# Patient Record
Sex: Female | Born: 1950 | Race: Black or African American | Hispanic: No | Smoking: Never smoker
Health system: Southern US, Community
[De-identification: ages and names within clinical notes are randomized; demographics above are authoritative.]

## PROBLEM LIST (undated history)

## (undated) DIAGNOSIS — E119 Type 2 diabetes mellitus without complications: Secondary | ICD-10-CM

## (undated) DIAGNOSIS — E785 Hyperlipidemia, unspecified: Secondary | ICD-10-CM

## (undated) DIAGNOSIS — M109 Gout, unspecified: Secondary | ICD-10-CM

## (undated) DIAGNOSIS — I10 Essential (primary) hypertension: Secondary | ICD-10-CM

## (undated) DIAGNOSIS — F329 Major depressive disorder, single episode, unspecified: Secondary | ICD-10-CM

## (undated) DIAGNOSIS — F32A Depression, unspecified: Secondary | ICD-10-CM

## (undated) DIAGNOSIS — R5383 Other fatigue: Secondary | ICD-10-CM

## (undated) HISTORY — PX: KNEE SURGERY: SHX244

## (undated) HISTORY — DX: Gout, unspecified: M10.9

## (undated) HISTORY — DX: Other fatigue: R53.83

## (undated) HISTORY — PX: CHOLECYSTECTOMY: SHX55

## (undated) HISTORY — PX: ABDOMINAL HYSTERECTOMY: SHX81

## (undated) HISTORY — PX: TOTAL ABDOMINAL HYSTERECTOMY W/ BILATERAL SALPINGOOPHORECTOMY: SHX83

## (undated) HISTORY — PX: CARPAL TUNNEL RELEASE: SHX101

## (undated) HISTORY — DX: Hyperlipidemia, unspecified: E78.5

## (undated) HISTORY — PX: ROTATOR CUFF REPAIR: SHX139

## (undated) HISTORY — DX: Type 2 diabetes mellitus without complications: E11.9

## (undated) HISTORY — DX: Major depressive disorder, single episode, unspecified: F32.9

## (undated) HISTORY — PX: OTHER SURGICAL HISTORY: SHX169

## (undated) HISTORY — DX: Depression, unspecified: F32.A

## (undated) HISTORY — DX: Essential (primary) hypertension: I10

---

## 2004-01-23 ENCOUNTER — Other Ambulatory Visit: Payer: Self-pay

## 2004-01-23 ENCOUNTER — Observation Stay: Payer: Self-pay | Admitting: Internal Medicine

## 2006-06-15 ENCOUNTER — Emergency Department: Payer: Self-pay | Admitting: Internal Medicine

## 2006-12-26 ENCOUNTER — Ambulatory Visit: Payer: Self-pay

## 2009-03-18 HISTORY — PX: APPENDECTOMY: SHX54

## 2010-04-17 ENCOUNTER — Emergency Department: Payer: Self-pay | Admitting: Emergency Medicine

## 2010-08-03 ENCOUNTER — Ambulatory Visit: Payer: Self-pay

## 2010-08-24 ENCOUNTER — Ambulatory Visit: Payer: Self-pay | Admitting: General Surgery

## 2010-08-28 LAB — PATHOLOGY REPORT

## 2010-11-16 ENCOUNTER — Emergency Department: Payer: Self-pay | Admitting: Internal Medicine

## 2011-11-25 ENCOUNTER — Emergency Department: Payer: Self-pay | Admitting: Emergency Medicine

## 2011-11-25 LAB — COMPREHENSIVE METABOLIC PANEL
Albumin: 3.8 g/dL (ref 3.4–5.0)
Alkaline Phosphatase: 80 U/L (ref 50–136)
Anion Gap: 7 (ref 7–16)
Bilirubin,Total: 0.3 mg/dL (ref 0.2–1.0)
Calcium, Total: 9.4 mg/dL (ref 8.5–10.1)
Chloride: 107 mmol/L (ref 98–107)
Co2: 28 mmol/L (ref 21–32)
EGFR (African American): >60
SGOT(AST): 25 U/L (ref 15–37)
SGPT (ALT): 17 U/L (ref 12–78)
Total Protein: 8 g/dL (ref 6.4–8.2)

## 2011-11-25 LAB — CBC
HGB: 12.4 g/dL (ref 12.0–16.0)
MCH: 26.3 pg (ref 26.0–34.0)
MCHC: 32.1 g/dL (ref 32.0–36.0)
Platelet: 259 10*3/uL (ref 150–440)
RBC: 4.71 10*6/uL (ref 3.80–5.20)
RDW: 14.2 % (ref 11.5–14.5)

## 2011-11-25 LAB — TROPONIN I: Troponin-I: <0.02 ng/mL

## 2011-11-25 LAB — CK TOTAL AND CKMB (NOT AT ARMC)
CK, Total: 145 U/L (ref 21–215)
CK-MB: 1.2 ng/mL (ref 0.5–3.6)

## 2013-09-16 ENCOUNTER — Ambulatory Visit: Payer: Self-pay | Admitting: Family Medicine

## 2013-11-05 ENCOUNTER — Ambulatory Visit: Payer: Self-pay | Admitting: Podiatry

## 2013-11-15 ENCOUNTER — Emergency Department: Payer: Self-pay | Admitting: Emergency Medicine

## 2013-11-15 LAB — COMPREHENSIVE METABOLIC PANEL
ALK PHOS: 75 U/L
ANION GAP: 8 (ref 7–16)
Albumin: 3.8 g/dL (ref 3.4–5.0)
BILIRUBIN TOTAL: 0.3 mg/dL (ref 0.2–1.0)
BUN: 12 mg/dL (ref 7–18)
CALCIUM: 8.7 mg/dL (ref 8.5–10.1)
Chloride: 106 mmol/L (ref 98–107)
Co2: 26 mmol/L (ref 21–32)
Creatinine: 0.9 mg/dL (ref 0.60–1.30)
Glucose: 95 mg/dL (ref 65–99)
Osmolality: 279 (ref 275–301)
Potassium: 3.3 mmol/L — ABNORMAL LOW (ref 3.5–5.1)
SGOT(AST): 17 U/L (ref 15–37)
SGPT (ALT): 20 U/L
Sodium: 140 mmol/L (ref 136–145)
Total Protein: 7.9 g/dL (ref 6.4–8.2)

## 2013-11-15 LAB — CBC WITH DIFFERENTIAL/PLATELET
BASOS ABS: 0.1 10*3/uL (ref 0.0–0.1)
Basophil %: 0.5 %
EOS PCT: 4 %
Eosinophil #: 0.4 10*3/uL (ref 0.0–0.7)
HCT: 40.4 % (ref 35.0–47.0)
HGB: 12.5 g/dL (ref 12.0–16.0)
Lymphocyte #: 3.3 10*3/uL (ref 1.0–3.6)
Lymphocyte %: 33.9 %
MCH: 25.8 pg — AB (ref 26.0–34.0)
MCHC: 30.9 g/dL — ABNORMAL LOW (ref 32.0–36.0)
MCV: 84 fL (ref 80–100)
Monocyte #: 0.8 x10 3/mm (ref 0.2–0.9)
Monocyte %: 8.5 %
NEUTROS ABS: 5.2 10*3/uL (ref 1.4–6.5)
Neutrophil %: 53.1 %
Platelet: 259 10*3/uL (ref 150–440)
RBC: 4.84 10*6/uL (ref 3.80–5.20)
RDW: 14.2 % (ref 11.5–14.5)
WBC: 9.9 10*3/uL (ref 3.6–11.0)

## 2013-11-15 LAB — URINALYSIS, COMPLETE
BLOOD: NEGATIVE
Bacteria: NONE SEEN
Bilirubin,UR: NEGATIVE
Glucose,UR: NEGATIVE mg/dL (ref 0–75)
KETONE: NEGATIVE
Leukocyte Esterase: NEGATIVE
Nitrite: NEGATIVE
PROTEIN: NEGATIVE
Ph: 6 (ref 4.5–8.0)
RBC,UR: <1 /HPF (ref 0–5)
Specific Gravity: 1.014 (ref 1.003–1.030)
Squamous Epithelial: NONE SEEN

## 2013-11-15 LAB — TROPONIN I

## 2013-11-15 LAB — LIPASE, BLOOD: LIPASE: 149 U/L (ref 73–393)

## 2014-02-25 ENCOUNTER — Encounter: Payer: Self-pay | Admitting: Podiatry

## 2014-02-26 NOTE — Progress Notes (Signed)
Subjective:     Patient ID: Ashley SpiresLinda Harris Molesky, female   DOB: 1950-04-07, 63 y.o.   MRN: [id number]  HPI   Review of Systems     Objective:   Physical Exam     Assessment:       Plan:          This encounter was created in error - please disregard.

## 2014-08-23 ENCOUNTER — Other Ambulatory Visit: Payer: Self-pay | Admitting: Family Medicine

## 2014-08-31 ENCOUNTER — Telehealth: Payer: Self-pay | Admitting: Unknown Physician Specialty

## 2014-08-31 NOTE — Telephone Encounter (Signed)
Pt came in said she needed a refill on blood pressure medicine but did not know the name of it. She says if [facility] could call [facility] on [address] she'd appreciate it. If there is an issue she would like a call back at the number provided.

## 2014-09-06 ENCOUNTER — Telehealth: Payer: Self-pay | Admitting: Family Medicine

## 2014-09-06 MED ORDER — AMLODIPINE BESYLATE 5 MG PO TABS: 5.0000 mg | ORAL_TABLET | Freq: Every day | ORAL | Status: DC

## 2014-09-06 NOTE — Telephone Encounter (Signed)
I think this is your patient Elnita Maxwell.

## 2014-09-06 NOTE — Telephone Encounter (Signed)
Patient needs a refill on Amlodipine 5mg   QD. She was last seen in November 2015 by CW, has not been seen since, also does not have an appt scheduled.

## 2014-09-06 NOTE — Telephone Encounter (Signed)
Sent in a refill but needs seen for further refills

## 2014-09-06 NOTE — Telephone Encounter (Signed)
[facility] in [place] called stated pt needs a refill on:  Amlodopine   Thanks.

## 2014-09-12 ENCOUNTER — Telehealth: Payer: Self-pay

## 2014-09-12 NOTE — Telephone Encounter (Signed)
Called  and left patient a voicemail to return my call

## 2014-09-12 NOTE — Telephone Encounter (Signed)
Patient called stating she needs her blood pressure/fluid pill refilled. I am going to call the patient ad find out exactly what medication she is talking about.

## 2014-09-13 NOTE — Telephone Encounter (Signed)
Tried to call patient and find out the name of the medication she is needing refilled. There was no answer so I left her a voicemail to return my call.

## 2014-09-15 DIAGNOSIS — G4762 Sleep related leg cramps: Secondary | ICD-10-CM

## 2014-09-15 DIAGNOSIS — E119 Type 2 diabetes mellitus without complications: Secondary | ICD-10-CM

## 2014-09-15 DIAGNOSIS — F32A Depression, unspecified: Secondary | ICD-10-CM

## 2014-09-15 DIAGNOSIS — R7301 Impaired fasting glucose: Secondary | ICD-10-CM | POA: Insufficient documentation

## 2014-09-15 DIAGNOSIS — E785 Hyperlipidemia, unspecified: Secondary | ICD-10-CM

## 2014-09-15 DIAGNOSIS — E669 Obesity, unspecified: Secondary | ICD-10-CM | POA: Insufficient documentation

## 2014-09-15 DIAGNOSIS — I10 Essential (primary) hypertension: Secondary | ICD-10-CM

## 2014-09-15 DIAGNOSIS — R5383 Other fatigue: Secondary | ICD-10-CM

## 2014-09-15 DIAGNOSIS — F329 Major depressive disorder, single episode, unspecified: Secondary | ICD-10-CM

## 2014-09-16 ENCOUNTER — Ambulatory Visit: Payer: Self-pay | Admitting: Unknown Physician Specialty

## 2014-10-03 ENCOUNTER — Ambulatory Visit (INDEPENDENT_AMBULATORY_CARE_PROVIDER_SITE_OTHER): Payer: BLUE CROSS/BLUE SHIELD | Admitting: Unknown Physician Specialty

## 2014-10-03 ENCOUNTER — Encounter: Payer: Self-pay | Admitting: Unknown Physician Specialty

## 2014-10-03 VITALS — BP 145/84 | HR 86 | Temp 98.5°F | Ht 63.5 in | Wt 222.4 lb

## 2014-10-03 DIAGNOSIS — R7301 Impaired fasting glucose: Secondary | ICD-10-CM | POA: Diagnosis not present

## 2014-10-03 DIAGNOSIS — I10 Essential (primary) hypertension: Secondary | ICD-10-CM

## 2014-10-03 DIAGNOSIS — E669 Obesity, unspecified: Secondary | ICD-10-CM | POA: Diagnosis not present

## 2014-10-03 DIAGNOSIS — R300 Dysuria: Secondary | ICD-10-CM | POA: Diagnosis not present

## 2014-10-03 DIAGNOSIS — E785 Hyperlipidemia, unspecified: Secondary | ICD-10-CM

## 2014-10-03 MED ORDER — HYDROCHLOROTHIAZIDE 25 MG PO TABS: 25.0000 mg | ORAL_TABLET | Freq: Every day | ORAL | Status: DC

## 2014-10-03 MED ORDER — AMLODIPINE BESYLATE 5 MG PO TABS: 5.0000 mg | ORAL_TABLET | Freq: Every day | ORAL | Status: DC

## 2014-10-03 MED ORDER — CIPROFLOXACIN HCL 250 MG PO TABS: 250.0000 mg | ORAL_TABLET | Freq: Two times a day (BID) | ORAL | Status: DC

## 2014-10-03 MED ORDER — LISINOPRIL 40 MG PO TABS: 40.0000 mg | ORAL_TABLET | Freq: Every day | ORAL | Status: DC

## 2014-10-03 NOTE — Progress Notes (Signed)
BP 145/84 mmHg  Pulse 86  Temp(Src) 98.5 F (36.9 C)  Ht 5' 3.5" (1.613 m)  Wt 222 lb 6.4 oz (100.88 kg)  BMI 38.77 kg/m2  SpO2 96%  LMP  (LMP Unknown)   Subjective:    Patient ID: Ashley Mcknight, female    DOB: 08-20-1950, 64 y.o.   MRN: [id number]  HPI: Ashley Mcknight is a 64 y.o. female  Chief Complaint  Patient presents with  . Medication Refill    pt states she needs bp med refilled   Hypertension This is a chronic problem. Condition status: A little high today but just came back from the gym. Pertinent negatives include no anxiety, blurred vision, chest pain, headaches, malaise/fatigue, neck pain, orthopnea, palpitations, peripheral edema, PND, shortness of breath or sweats. There are no compliance problems.     Relevant past medical, surgical, family and social history reviewed and updated as indicated. Interim medical history since our last visit reviewed. Allergies and medications reviewed and updated.  Review of Systems  Constitutional: Negative for malaise/fatigue.  Eyes: Negative for blurred vision.  Respiratory: Negative for shortness of breath.   Cardiovascular: Negative for chest pain, palpitations, orthopnea and PND.  Genitourinary:       Having trouble "holding my urine"  Musculoskeletal: Negative for neck pain.  Neurological: Negative for headaches.    Per HPI unless specifically indicated above     Objective:    BP 145/84 mmHg  Pulse 86  Temp(Src) 98.5 F (36.9 C)  Ht 5' 3.5" (1.613 m)  Wt 222 lb 6.4 oz (100.88 kg)  BMI 38.77 kg/m2  SpO2 96%  LMP  (LMP Unknown)  Wt Readings from Last 3 Encounters:  10/03/14 222 lb 6.4 oz (100.88 kg)  02/04/14 220 lb (99.791 kg)    Physical Exam  Constitutional: She is oriented to person, place, and time. She appears well-developed and well-nourished. No distress.  HENT:  Head: Normocephalic and atraumatic.  Eyes: Conjunctivae and lids are normal. Right eye exhibits no discharge. Left eye  exhibits no discharge. No scleral icterus.  Cardiovascular: Normal rate, regular rhythm, normal heart sounds and intact distal pulses.   Pulmonary/Chest: Effort normal. No respiratory distress.  Abdominal: Normal appearance and bowel sounds are normal. She exhibits no distension. There is no splenomegaly or hepatomegaly. There is no tenderness.  Musculoskeletal: Normal range of motion.  Neurological: She is alert and oriented to person, place, and time.  Skin: Skin is intact. No rash noted. No pallor.  Psychiatric: She has a normal mood and affect. Her behavior is normal. Judgment and thought content normal.        Assessment & Plan:   Problem List Items Addressed This Visit      Cardiovascular and Mediastinum   Hypertension - Primary    Not quite to goal today.  Will recheck in about 3 months      Relevant Medications   amLODipine (NORVASC) 5 MG tablet   hydrochlorothiazide (HYDRODIURIL) 25 MG tablet   lisinopril (PRINIVIL,ZESTRIL) 40 MG tablet   Other Relevant Orders   Comprehensive metabolic panel   Microalbumin, Urine Waived   Uric acid     Endocrine   Impaired fasting glucose    Hgb A1C is 6.0 today.  Discussed results        Other   Hyperlipidemia   Relevant Medications   amLODipine (NORVASC) 5 MG tablet   hydrochlorothiazide (HYDRODIURIL) 25 MG tablet   lisinopril (PRINIVIL,ZESTRIL) 40 MG tablet   Other Relevant  Orders   Lipid Panel Piccolo, Waived   Obesity   Relevant Orders   Bayer DCA Hb A1c Waived    Other Visit Diagnoses    Dysuria        Urine positive.  Will rx Cipro to see if it improves symptoms    Relevant Medications    ciprofloxacin (CIPRO) 250 MG tablet    Other Relevant Orders    UA/M w/rflx Culture, Routine        Follow up plan: Return in about 3 months (around 01/03/2015) for BP.

## 2014-10-03 NOTE — Patient Instructions (Signed)
DASH Eating Plan °DASH stands for "Dietary Approaches to Stop Hypertension." The DASH eating plan is a healthy eating plan that has been shown to reduce high blood pressure (hypertension). Additional health benefits may include reducing the risk of type 2 diabetes mellitus, heart disease, and stroke. The DASH eating plan may also help with weight loss. °WHAT DO I NEED TO KNOW ABOUT THE DASH EATING PLAN? °For the DASH eating plan, you will follow these general guidelines: °· Choose foods with a percent daily value for sodium of less than 5% (as listed on the food label). °· Use salt-free seasonings or herbs instead of table salt or sea salt. °· Check with your health care provider or pharmacist before using salt substitutes. °· Eat lower-sodium products, often labeled as "lower sodium" or "no salt added." °· Eat fresh foods. °· Eat more vegetables, fruits, and low-fat dairy products. °· Choose whole grains. Look for the word "whole" as the first word in the ingredient list. °· Choose fish and skinless chicken or turkey more often than red meat. Limit fish, poultry, and meat to 6 oz (170 g) each day. °· Limit sweets, desserts, sugars, and sugary drinks. °· Choose heart-healthy fats. °· Limit cheese to 1 oz (28 g) per day. °· Eat more home-cooked food and less restaurant, buffet, and fast food. °· Limit fried foods. °· Cook foods using methods other than frying. °· Limit canned vegetables. If you do use them, rinse them well to decrease the sodium. °· When eating at a restaurant, ask that your food be prepared with less salt, or no salt if possible. °WHAT FOODS CAN I EAT? °Seek help from a dietitian for individual calorie needs. °Grains °Whole grain or whole wheat bread. Brown rice. Whole grain or whole wheat pasta. Quinoa, bulgur, and whole grain cereals. Low-sodium cereals. Corn or whole wheat flour tortillas. Whole grain cornbread. Whole grain crackers. Low-sodium crackers. °Vegetables °Fresh or frozen vegetables  (raw, steamed, roasted, or grilled). Low-sodium or reduced-sodium tomato and vegetable juices. Low-sodium or reduced-sodium tomato sauce and paste. Low-sodium or reduced-sodium canned vegetables.  °Fruits °All fresh, canned (in natural juice), or frozen fruits. °Meat and Other Protein Products °Ground beef (85% or leaner), grass-fed beef, or beef trimmed of fat. Skinless chicken or turkey. Ground chicken or turkey. Pork trimmed of fat. All fish and seafood. Eggs. Dried beans, peas, or lentils. Unsalted nuts and seeds. Unsalted canned beans. °Dairy °Low-fat dairy products, such as skim or 1% milk, 2% or reduced-fat cheeses, low-fat ricotta or cottage cheese, or plain low-fat yogurt. Low-sodium or reduced-sodium cheeses. °Fats and Oils °Tub margarines without trans fats. Light or reduced-fat mayonnaise and salad dressings (reduced sodium). Avocado. Safflower, olive, or canola oils. Natural peanut or almond butter. °Other °Unsalted popcorn and pretzels. °The items listed above may not be a complete list of recommended foods or beverages. Contact your dietitian for more options. °WHAT FOODS ARE NOT RECOMMENDED? °Grains °White bread. White pasta. White rice. Refined cornbread. Bagels and croissants. Crackers that contain trans fat. °Vegetables °Creamed or fried vegetables. Vegetables in a cheese sauce. Regular canned vegetables. Regular canned tomato sauce and paste. Regular tomato and vegetable juices. °Fruits °Dried fruits. Canned fruit in light or heavy syrup. Fruit juice. °Meat and Other Protein Products °Fatty cuts of meat. Ribs, chicken wings, bacon, sausage, bologna, salami, chitterlings, fatback, hot dogs, bratwurst, and packaged luncheon meats. Salted nuts and seeds. Canned beans with salt. °Dairy °Whole or 2% milk, cream, half-and-half, and cream cheese. Whole-fat or sweetened yogurt. Full-fat   cheeses or blue cheese. Nondairy creamers and whipped toppings. Processed cheese, cheese spreads, or cheese  curds. °Condiments °Onion and garlic salt, seasoned salt, table salt, and sea salt. Canned and packaged gravies. Worcestershire sauce. Tartar sauce. Barbecue sauce. Teriyaki sauce. Soy sauce, including reduced sodium. Steak sauce. Fish sauce. Oyster sauce. Cocktail sauce. Horseradish. Ketchup and mustard. Meat flavorings and tenderizers. Bouillon cubes. Hot sauce. Tabasco sauce. Marinades. Taco seasonings. Relishes. °Fats and Oils °Butter, stick margarine, lard, shortening, ghee, and bacon fat. Coconut, palm kernel, or palm oils. Regular salad dressings. °Other °Pickles and olives. Salted popcorn and pretzels. °The items listed above may not be a complete list of foods and beverages to avoid. Contact your dietitian for more information. °WHERE CAN I FIND MORE INFORMATION? °National Heart, Lung, and [employer] Institute: www.nhlbi.nih.gov/health/health-topics/topics/dash/ °Document Released: 02/21/2011 Document Revised: 07/19/2013 Document Reviewed: 01/06/2013 °ExitCare® Patient Information ©2015 [employer]. This information is not intended to replace advice given to you by your health care provider. Make sure you discuss any questions you have with your health care provider. ° °

## 2014-10-03 NOTE — Assessment & Plan Note (Signed)
Not quite to goal today.  Will recheck in about 3 months

## 2014-10-03 NOTE — Assessment & Plan Note (Signed)
Hgb A1C is 6.0 today.  Discussed results

## 2014-10-04 LAB — COMPREHENSIVE METABOLIC PANEL
A/G RATIO: 1.3 (ref 1.1–2.5)
ALBUMIN: 3.9 g/dL (ref 3.6–4.8)
ALK PHOS: 69 IU/L (ref 39–117)
ALT: 11 IU/L (ref 0–32)
AST: 14 IU/L (ref 0–40)
BUN/Creatinine Ratio: 16 (ref 11–26)
BUN: 14 mg/dL (ref 8–27)
CALCIUM: 9 mg/dL (ref 8.7–10.3)
CO2: 21 mmol/L (ref 18–29)
CREATININE: 0.87 mg/dL (ref 0.57–1.00)
Chloride: 101 mmol/L (ref 97–108)
GFR calc non Af Amer: 72 mL/min/{1.73_m2} (ref >59)
GFR, EST AFRICAN AMERICAN: 83 mL/min/{1.73_m2} (ref >59)
GLUCOSE: 84 mg/dL (ref 65–99)
Globulin, Total: 3 g/dL (ref 1.5–4.5)
POTASSIUM: 4.3 mmol/L (ref 3.5–5.2)
SODIUM: 140 mmol/L (ref 134–144)
Total Protein: 6.9 g/dL (ref 6.0–8.5)

## 2014-10-04 LAB — URIC ACID: Uric Acid: 4.8 mg/dL (ref 2.5–7.1)

## 2014-10-04 LAB — LIPID PANEL PICCOLO, WAIVED
CHOLESTEROL PICCOLO, WAIVED: 144 mg/dL (ref <200)
Chol/HDL Ratio Piccolo,Waive: 2.1 mg/dL
HDL Chol Piccolo, Waived: 67 mg/dL (ref >59)
LDL Chol Calc Piccolo Waived: 63 mg/dL (ref <100)
Triglycerides Piccolo,Waived: 73 mg/dL (ref <150)
VLDL Chol Calc Piccolo,Waive: 15 mg/dL (ref <30)

## 2014-10-04 LAB — MICROALBUMIN, URINE WAIVED
Creatinine, Urine Waived: 100 mg/dL (ref 10–300)
Microalb, Ur Waived: 10 mg/L (ref 0–19)

## 2014-10-04 LAB — BAYER DCA HB A1C WAIVED: HB A1C (BAYER DCA - WAIVED): 6 % (ref <7.0)

## 2014-10-04 LAB — MICROSCOPIC EXAMINATION

## 2014-10-05 ENCOUNTER — Telehealth: Payer: Self-pay

## 2014-10-05 DIAGNOSIS — I10 Essential (primary) hypertension: Secondary | ICD-10-CM

## 2014-10-05 LAB — UA/M W/RFLX CULTURE, ROUTINE

## 2014-10-05 MED ORDER — HYDROCHLOROTHIAZIDE 25 MG PO TABS: 25.0000 mg | ORAL_TABLET | Freq: Every day | ORAL | Status: DC

## 2014-10-05 NOTE — Telephone Encounter (Signed)
Patient's pharmacy called and stated that there is two sets of directions on the rx for hydrochlorothiazide medicine. Is she supposed to be taking a full tablet or half a tablet?

## 2014-10-05 NOTE — Telephone Encounter (Signed)
One a day please.  I will rewrite.

## 2014-12-27 ENCOUNTER — Ambulatory Visit (INDEPENDENT_AMBULATORY_CARE_PROVIDER_SITE_OTHER): Payer: BLUE CROSS/BLUE SHIELD | Admitting: Unknown Physician Specialty

## 2014-12-27 ENCOUNTER — Encounter: Payer: Self-pay | Admitting: Unknown Physician Specialty

## 2014-12-27 VITALS — BP 113/74 | HR 85 | Temp 98.4°F | Ht 63.0 in | Wt 210.0 lb

## 2014-12-27 DIAGNOSIS — I10 Essential (primary) hypertension: Secondary | ICD-10-CM | POA: Diagnosis not present

## 2014-12-27 DIAGNOSIS — R7301 Impaired fasting glucose: Secondary | ICD-10-CM

## 2014-12-27 LAB — BAYER DCA HB A1C WAIVED: HB A1C (BAYER DCA - WAIVED): 5.9 % (ref <7.0)

## 2014-12-27 MED ORDER — HYDROCHLOROTHIAZIDE 25 MG PO TABS: 25.0000 mg | ORAL_TABLET | Freq: Every day | ORAL | Status: DC

## 2014-12-27 MED ORDER — LISINOPRIL 40 MG PO TABS: 40.0000 mg | ORAL_TABLET | Freq: Every day | ORAL | Status: DC

## 2014-12-27 NOTE — Assessment & Plan Note (Signed)
Stop Amlodipine due to much improvement due to lifestyle changes

## 2014-12-27 NOTE — Assessment & Plan Note (Signed)
Hgb A1C is 5.9 today.  Down from 6.0

## 2014-12-27 NOTE — Progress Notes (Signed)
BP 113/74 mmHg  Pulse 85  Temp(Src) 98.4 F (36.9 C)  Ht  (1.6 m)  Wt 210 lb (95.255 kg)  BMI 37.21 kg/m2  SpO2 97%  LMP  (LMP Unknown)   Subjective:    Patient ID: Ashley Mcknight, female    DOB: June 03, 1950, 64 y.o.   MRN: [id number]  HPI: Ashley Mcknight is a 64 y.o. female  Chief Complaint  Patient presents with  . Hypertension  . Hyperlipidemia   Hypertension This is a chronic (Pt with recent weight loss.  she would like to get off her medications if possible.  ) problem. The problem has been rapidly improving since onset. The problem is controlled. Pertinent negatives include no anxiety, blurred vision, chest pain, headaches, malaise/fatigue, neck pain, orthopnea, palpitations, peripheral edema, PND, shortness of breath or sweats. There are no associated agents to hypertension. Past treatments include nothing. There are no compliance problems.     Relevant past medical, surgical, family and social history reviewed and updated as indicated. Interim medical history since our last visit reviewed. Allergies and medications reviewed and updated.  Review of Systems  Constitutional: Negative.  Negative for malaise/fatigue.  HENT: Negative.   Eyes: Negative.  Negative for blurred vision.  Respiratory: Negative.  Negative for shortness of breath.   Cardiovascular: Negative.  Negative for chest pain, palpitations, orthopnea and PND.  Gastrointestinal: Negative.   Endocrine: Negative.   Genitourinary: Negative.   Musculoskeletal: Negative.  Negative for neck pain.  Skin: Negative.   Allergic/Immunologic: Negative.   Neurological: Negative.  Negative for headaches.  Hematological: Negative.   Psychiatric/Behavioral: Negative.     Per HPI unless specifically indicated above     Objective:    BP 113/74 mmHg  Pulse 85  Temp(Src) 98.4 F (36.9 C)  Ht  (1.6 m)  Wt 210 lb (95.255 kg)  BMI 37.21 kg/m2  SpO2 97%  LMP  (LMP Unknown)  Wt Readings from Last 3  Encounters:  12/27/14 210 lb (95.255 kg)  10/03/14 222 lb 6.4 oz (100.88 kg)  02/04/14 220 lb (99.791 kg)    Physical Exam  Constitutional: She is oriented to person, place, and time. She appears well-developed and well-nourished. No distress.  HENT:  Head: Normocephalic and atraumatic.  Eyes: Conjunctivae and lids are normal. Right eye exhibits no discharge. Left eye exhibits no discharge. No scleral icterus.  Cardiovascular: Normal rate, regular rhythm and normal heart sounds.   Pulmonary/Chest: Effort normal and breath sounds normal. No respiratory distress.  Abdominal: Normal appearance. There is no splenomegaly or hepatomegaly.  Musculoskeletal: Normal range of motion.  Neurological: She is alert and oriented to person, place, and time.  Skin: Skin is intact. No rash noted. No pallor.  Psychiatric: She has a normal mood and affect. Her behavior is normal. Judgment and thought content normal.    Assessment & Plan:   Problem List Items Addressed This Visit      Unprioritized   Hypertension - Primary    Stop Amlodipine due to much improvement due to lifestyle changes      Relevant Medications   hydrochlorothiazide (HYDRODIURIL) 25 MG tablet   lisinopril (PRINIVIL,ZESTRIL) 40 MG tablet   Other Relevant Orders   Comprehensive metabolic panel   Impaired fasting glucose    Hgb A1C is 5.9 today.  Down from 6.0      Relevant Orders   Bayer DCA Hb A1c Waived       Follow up plan: Return in about 3  months (around 03/29/2015) for Weaning BP meds further.Marland Kitchen

## 2014-12-28 LAB — COMPREHENSIVE METABOLIC PANEL
ALBUMIN: 4.1 g/dL (ref 3.6–4.8)
ALK PHOS: 74 IU/L (ref 39–117)
ALT: 15 IU/L (ref 0–32)
AST: 18 IU/L (ref 0–40)
Albumin/Globulin Ratio: 1.3 (ref 1.1–2.5)
BILIRUBIN TOTAL: 0.3 mg/dL (ref 0.0–1.2)
BUN / CREAT RATIO: 14 (ref 11–26)
BUN: 14 mg/dL (ref 8–27)
CHLORIDE: 100 mmol/L (ref 97–108)
CO2: 26 mmol/L (ref 18–29)
Calcium: 9.8 mg/dL (ref 8.7–10.3)
Creatinine, Ser: 0.98 mg/dL (ref 0.57–1.00)
GFR calc Af Amer: 71 mL/min/{1.73_m2} (ref >59)
GFR calc non Af Amer: 62 mL/min/{1.73_m2} (ref >59)
GLUCOSE: 98 mg/dL (ref 65–99)
Globulin, Total: 3.2 g/dL (ref 1.5–4.5)
Potassium: 4.6 mmol/L (ref 3.5–5.2)
Sodium: 139 mmol/L (ref 134–144)
Total Protein: 7.3 g/dL (ref 6.0–8.5)

## 2015-01-04 ENCOUNTER — Ambulatory Visit: Payer: BLUE CROSS/BLUE SHIELD | Admitting: Unknown Physician Specialty

## 2015-01-06 ENCOUNTER — Other Ambulatory Visit: Payer: Self-pay | Admitting: Unknown Physician Specialty

## 2015-03-17 ENCOUNTER — Emergency Department
Admission: EM | Admit: 2015-03-17 | Discharge: 2015-03-17 | Disposition: A | Discharge status: Home / Self Care | Payer: BLUE CROSS/BLUE SHIELD | Attending: Emergency Medicine | Admitting: Emergency Medicine

## 2015-03-17 ENCOUNTER — Encounter: Payer: Self-pay | Admitting: Emergency Medicine

## 2015-03-17 DIAGNOSIS — K2100 Gastro-esophageal reflux disease with esophagitis, without bleeding: Secondary | ICD-10-CM

## 2015-03-17 DIAGNOSIS — Z79899 Other long term (current) drug therapy: Secondary | ICD-10-CM | POA: Insufficient documentation

## 2015-03-17 DIAGNOSIS — E119 Type 2 diabetes mellitus without complications: Secondary | ICD-10-CM | POA: Diagnosis not present

## 2015-03-17 DIAGNOSIS — Z7982 Long term (current) use of aspirin: Secondary | ICD-10-CM | POA: Diagnosis not present

## 2015-03-17 DIAGNOSIS — I10 Essential (primary) hypertension: Secondary | ICD-10-CM | POA: Insufficient documentation

## 2015-03-17 DIAGNOSIS — R079 Chest pain, unspecified: Secondary | ICD-10-CM

## 2015-03-17 DIAGNOSIS — R05 Cough: Secondary | ICD-10-CM | POA: Diagnosis not present

## 2015-03-17 LAB — COMPREHENSIVE METABOLIC PANEL
ALK PHOS: 76 U/L (ref 38–126)
ALT: 12 U/L — AB (ref 14–54)
ANION GAP: 7 (ref 5–15)
AST: 15 U/L (ref 15–41)
Albumin: 4.4 g/dL (ref 3.5–5.0)
BUN: 15 mg/dL (ref 6–20)
CALCIUM: 9.4 mg/dL (ref 8.9–10.3)
CO2: 26 mmol/L (ref 22–32)
CREATININE: 0.8 mg/dL (ref 0.44–1.00)
Chloride: 103 mmol/L (ref 101–111)
Glucose, Bld: 105 mg/dL — ABNORMAL HIGH (ref 65–99)
Potassium: 3.6 mmol/L (ref 3.5–5.1)
SODIUM: 136 mmol/L (ref 135–145)
TOTAL PROTEIN: 8.1 g/dL (ref 6.5–8.1)
Total Bilirubin: 0.4 mg/dL (ref 0.3–1.2)

## 2015-03-17 LAB — CBC WITH DIFFERENTIAL/PLATELET
Basophils Absolute: 0 10*3/uL (ref 0–0.1)
Basophils Relative: 1 %
EOS ABS: 0.4 10*3/uL (ref 0–0.7)
EOS PCT: 6 %
HCT: 41 % (ref 35.0–47.0)
HEMOGLOBIN: 13.4 g/dL (ref 12.0–16.0)
LYMPHS ABS: 2.8 10*3/uL (ref 1.0–3.6)
LYMPHS PCT: 35 %
MCH: 26.2 pg (ref 26.0–34.0)
MCHC: 32.6 g/dL (ref 32.0–36.0)
MCV: 80.1 fL (ref 80.0–100.0)
MONOS PCT: 9 %
Monocytes Absolute: 0.7 10*3/uL (ref 0.2–0.9)
Neutro Abs: 4 10*3/uL (ref 1.4–6.5)
Neutrophils Relative %: 49 %
PLATELETS: 267 10*3/uL (ref 150–440)
RBC: 5.12 MIL/uL (ref 3.80–5.20)
RDW: 13.9 % (ref 11.5–14.5)
WBC: 7.9 10*3/uL (ref 3.6–11.0)

## 2015-03-17 LAB — TROPONIN I

## 2015-03-17 LAB — LIPASE, BLOOD: LIPASE: 27 U/L (ref 11–51)

## 2015-03-17 MED ORDER — RANITIDINE HCL 150 MG PO CAPS: 150.0000 mg | ORAL_CAPSULE | Freq: Two times a day (BID) | ORAL | Status: DC

## 2015-03-17 MED ORDER — SUCRALFATE 1 G PO TABS: 1.0000 g | ORAL_TABLET | Freq: Four times a day (QID) | ORAL | Status: DC

## 2015-03-17 MED ORDER — FAMOTIDINE 20 MG PO TABS
40.0000 mg | ORAL_TABLET | Freq: Once | ORAL | Status: AC
  Administered 2015-03-17: 40 mg via ORAL
  Filled 2015-03-17: qty 2

## 2015-03-17 MED ORDER — GI COCKTAIL ~~LOC~~
30.0000 mL | ORAL | Status: AC
  Administered 2015-03-17: 30 mL via ORAL
  Filled 2015-03-17: qty 30

## 2015-03-17 NOTE — ED Provider Notes (Signed)
[facility] Emergency Department Provider Note  ____________________________________________  Time seen: 8:15 AM  I have reviewed the triage vital signs and the nursing notes.   HISTORY  Chief Complaint Chest Pain    HPI Ashley Mcknight is a 64 y.o. female who complains of mid chest burning pain for the past 2 months. It is intermittent worse after eating, worse when lying supine and associated with a bitter taste in her mouth in the morning and some nonproductive coughing worse in the mornings. She has a history of acid reflux and with the change in diet and increase food intake over the holidays in the past month and discontinuation of her Nexium inadvertently, she feels that the symptoms are consistent with her previous acid reflux that it is gotten worsen. No exertional symptoms. No vomiting shortness of breath diaphoresis or dizziness. Not pleuritic. No trauma or recent illnesses.  Nonradiating.   Past Medical History  Diagnosis Date  . Hyperlipidemia   . Hypertension   . Depression   . Diabetes mellitus without complication (HCC)   . Fatigue   . Gout      Patient Active Problem List   Diagnosis Date Noted  . Hypertension 09/15/2014  . Sleep related leg cramps 09/15/2014  . Impaired fasting glucose 09/15/2014  . Obesity 09/15/2014     Past Surgical History  Procedure Laterality Date  . Abdominal hysterectomy    . Rotator cuff repair    . Cholecystectomy    . Knee surgery Left   . Carpal tunnel release Bilateral   . Toenail removal       Current Outpatient Rx  Name  Route  Sig  Dispense  Refill  . aspirin 81 MG tablet   Oral   Take 81 mg by mouth daily.         . ciprofloxacin (CIPRO) 250 MG tablet   Oral   Take 1 tablet (250 mg total) by mouth 2 (two) times daily. Patient not taking: Reported on 12/27/2014   6 tablet   0   . hydrochlorothiazide (HYDRODIURIL) 25 MG tablet   Oral   Take 1 tablet (25 mg total) by mouth  daily.   90 tablet   1   . lisinopril (PRINIVIL,ZESTRIL) 40 MG tablet   Oral   Take 1 tablet (40 mg total) by mouth daily.   90 tablet   1   . ranitidine (ZANTAC) 150 MG capsule   Oral   Take 1 capsule (150 mg total) by mouth 2 (two) times daily.   28 capsule   0   . sucralfate (CARAFATE) 1 g tablet   Oral   Take 1 tablet (1 g total) by mouth 4 (four) times daily.   120 tablet   1      Allergies Review of patient's allergies indicates no known allergies.   Family History  Problem Relation Age of Onset  . Hypertension Mother   . Cancer Father     throat  . Cancer Brother     Social History Social History  Substance Use Topics  . Smoking status: Never Smoker   . Smokeless tobacco: Never Used  . Alcohol Use: No    Review of Systems  Constitutional:   No fever or chills. No weight changes Eyes:   No blurry vision or double vision.  ENT:   No sore throat. Cardiovascular:   Positive as above chest pain. Respiratory:   No dyspnea, occasional cough. Gastrointestinal:   Negative for  abdominal pain, vomiting and diarrhea.  No BRBPR or melena. Genitourinary:   Negative for dysuria, urinary retention, bloody urine, or difficulty urinating. Musculoskeletal:   Negative for back pain. No joint swelling or pain. Skin:   Negative for rash. Neurological:   Negative for headaches, focal weakness or numbness. Psychiatric:  No anxiety or depression.   Endocrine:  No hot/cold intolerance, changes in energy, or sleep difficulty.  10-point ROS otherwise negative.  ____________________________________________   PHYSICAL EXAM:  VITAL SIGNS: ED Triage Vitals  Enc Vitals Group     BP 03/17/15 0810 140/76 mmHg     Pulse Rate 03/17/15 0810 83     Resp 03/17/15 0810 20     Temp 03/17/15 0810 98.1 F (36.7 C)     Temp Source 03/17/15 0810 Oral     SpO2 03/17/15 0810 100 %     Weight 03/17/15 0810 211 lb (95.709 kg)     Height 03/17/15 0810  (1.626 m)     Head Cir  --      Peak Flow --      Pain Score 03/17/15 0811 7     Pain Loc --      Pain Edu? --      Excl. in GC? --     Vital signs reviewed, nursing assessments reviewed.   Constitutional:   Alert and oriented. Well appearing and in no distress. Eyes:   No scleral icterus. No conjunctival pallor. PERRL. EOMI ENT   Head:   Normocephalic and atraumatic.   Nose:   No congestion/rhinnorhea. No septal hematoma   Mouth/Throat:   MMM, no pharyngeal erythema. No peritonsillar mass. No uvula shift.   Neck:   No stridor. No SubQ emphysema. No meningismus. Hematological/Lymphatic/Immunilogical:   No cervical lymphadenopathy. Cardiovascular:   RRR. Normal and symmetric distal pulses are present in all extremities. No murmurs, rubs, or gallops. Respiratory:   Normal respiratory effort without tachypnea nor retractions. Breath sounds are clear and equal bilaterally. No wheezes/rales/rhonchi. Gastrointestinal:   Soft with slight left upper quadrant tenderness No distention. There is no CVA tenderness.  No rebound, rigidity, or guarding. Genitourinary:   deferred Musculoskeletal:   Nontender with normal range of motion in all extremities. No joint effusions.  No lower extremity tenderness.  No edema. Neurologic:   Normal speech and language.  CN 2-10 normal. Motor grossly intact. No pronator drift.  Normal gait. No gross focal neurologic deficits are appreciated.  Skin:    Skin is warm, dry and intact. No rash noted.  No petechiae, purpura, or bullae. Psychiatric:   Mood and affect are normal. Speech and behavior are normal. Patient exhibits appropriate insight and judgment.  ____________________________________________    LABS (pertinent positives/negatives) (all labs ordered are listed, but only abnormal results are displayed) Labs Reviewed  CBC WITH DIFFERENTIAL/PLATELET  TROPONIN I  LIPASE, BLOOD  COMPREHENSIVE METABOLIC PANEL    ____________________________________________   EKG  Interpreted by me Normal sinus rhythm rate of 86, normal axis and intervals. Poor R-wave progression in anterior precordial leads. Normal ST segments and T waves  ____________________________________________    RADIOLOGY    ____________________________________________   PROCEDURES   ____________________________________________   INITIAL IMPRESSION / ASSESSMENT AND PLAN / ED COURSE  Pertinent labs & imaging results that were available during my care of the patient were reviewed by me and considered in my medical decision making (see chart for details).  Patient presents with chest pain that is very atypical for ACS. I doubt ACS  PE TAD pneumothorax carditis mediastinitis pneumonia or sepsis. By history it is very consistent with acid reflux and GERD. We'll give her a GI cocktail and Pepcid for now. She just resumed her Nexium yesterday that is likely not quite taken effect yet but she will continue this. Because of her multiple risk factors for CAD, we'll check a set of labs but if there are no serious abnormalities she should be stable for discharge home. Vital signs are unremarkable. She'll follow up with primary care.     ____________________________________________   FINAL CLINICAL IMPRESSION(S) / ED DIAGNOSES  Final diagnoses:  Nonspecific chest pain  Gastroesophageal reflux disease with esophagitis      Sharman Cheek, MD 03/17/15 [phone]

## 2015-03-17 NOTE — ED Notes (Signed)
Pt informed to return if any life threatening symptoms occur.  

## 2015-03-17 NOTE — Discharge Instructions (Signed)
Food Choices for Gastroesophageal Reflux Disease, Adult °When you have gastroesophageal reflux disease (GERD), the foods you eat and your eating habits are very important. Choosing the right foods can help ease the discomfort of GERD. °WHAT GENERAL GUIDELINES DO I NEED TO FOLLOW? °· Choose fruits, vegetables, whole grains, low-fat dairy products, and low-fat meat, fish, and poultry. °· Limit fats such as oils, salad dressings, butter, nuts, and avocado. °· Keep a food diary to identify foods that cause symptoms. °· Avoid foods that cause reflux. These may be different for different people. °· Eat frequent small meals instead of three large meals each day. °· Eat your meals slowly, in a relaxed setting. °· Limit fried foods. °· Cook foods using methods other than frying. °· Avoid drinking alcohol. °· Avoid drinking large amounts of liquids with your meals. °· Avoid bending over or lying down until 2-3 hours after eating. °WHAT FOODS ARE NOT RECOMMENDED? °The following are some foods and drinks that may worsen your symptoms: °Vegetables °Tomatoes. Tomato juice. Tomato and spaghetti sauce. Chili peppers. Onion and garlic. Horseradish. °Fruits °Oranges, grapefruit, and lemon (fruit and juice). °Meats °High-fat meats, fish, and poultry. This includes hot dogs, ribs, ham, sausage, salami, and bacon. °Dairy °Whole milk and chocolate milk. Sour cream. Cream. Butter. Ice cream. Cream cheese.  °Beverages °Coffee and tea, with or without caffeine. Carbonated beverages or energy drinks. °Condiments °Hot sauce. Barbecue sauce.  °Sweets/Desserts °Chocolate and cocoa. Donuts. Peppermint and spearmint. °Fats and Oils °High-fat foods, including French fries and potato chips. °Other °Vinegar. Strong spices, such as black pepper, white pepper, red pepper, cayenne, curry powder, cloves, ginger, and chili powder. °The items listed above may not be a complete list of foods and beverages to avoid. Contact your dietitian for more  information. °  °This information is not intended to replace advice given to you by your health care provider. Make sure you discuss any questions you have with your health care provider. °  °Document Released: 03/04/2005 Document Revised: 03/25/2014 Document Reviewed: 01/06/2013 °Elsevier Interactive Patient Education ©2016 [employer] ° °Gastroesophageal Reflux Disease, Adult °Normally, food travels down the esophagus and stays in the stomach to be digested. However, when a person has gastroesophageal reflux disease (GERD), food and stomach acid move back up into the esophagus. When this happens, the esophagus becomes sore and inflamed. Over time, GERD can create small holes (ulcers) in the lining of the esophagus.  °CAUSES °This condition is caused by a problem with the muscle between the esophagus and the stomach (lower esophageal sphincter, or LES). Normally, the LES muscle closes after food passes through the esophagus to the stomach. When the LES is weakened or abnormal, it does not close properly, and that allows food and stomach acid to go back up into the esophagus. The LES can be weakened by certain dietary substances, medicines, and medical conditions, including: °· Tobacco use. °· Pregnancy. °· Having a hiatal hernia. °· Heavy alcohol use. °· Certain foods and beverages, such as coffee, chocolate, onions, and peppermint. °RISK FACTORS °This condition is more likely to develop in: °· People who have an increased body weight. °· People who have connective tissue disorders. °· People who use NSAID medicines. °SYMPTOMS °Symptoms of this condition include: °· Heartburn. °· Difficult or painful swallowing. °· The feeling of having a lump in the throat. °· A bitter taste in the mouth. °· Bad breath. °· Having a large amount of saliva. °· Having an upset or bloated stomach. °· Belching. °· Chest pain. °·   Shortness of breath or wheezing. °· Ongoing (chronic) cough or a night-time cough. °· Wearing away of  tooth enamel. °· Weight loss. °Different conditions can cause chest pain. Make sure to see your health care provider if you experience chest pain. °DIAGNOSIS °Your health care provider will take a medical history and perform a physical exam. To determine if you have mild or severe GERD, your health care provider may also monitor how you respond to treatment. You may also have other tests, including: °· An endoscopy to examine your stomach and esophagus with a small camera. °· A test that measures the acidity level in your esophagus. °· A test that measures how much pressure is on your esophagus. °· A barium swallow or modified barium swallow to show the shape, size, and functioning of your esophagus. °TREATMENT °The goal of treatment is to help relieve your symptoms and to prevent complications. Treatment for this condition may vary depending on how severe your symptoms are. Your health care provider may recommend: °· Changes to your diet. °· Medicine. °· Surgery. °HOME CARE INSTRUCTIONS °Diet °· Follow a diet as recommended by your health care provider. This may involve avoiding foods and drinks such as: °¨ Coffee and tea (with or without caffeine). °¨ Drinks that contain alcohol. °¨ Energy drinks and sports drinks. °¨ Carbonated drinks or sodas. °¨ Chocolate and cocoa. °¨ Peppermint and mint flavorings. °¨ Garlic and onions. °¨ Horseradish. °¨ Spicy and acidic foods, including peppers, chili powder, curry powder, vinegar, hot sauces, and barbecue sauce. °¨ Citrus fruit juices and citrus fruits, such as oranges, lemons, and limes. °¨ Tomato-based foods, such as red sauce, chili, salsa, and pizza with red sauce. °¨ Fried and fatty foods, such as donuts, french fries, potato chips, and high-fat dressings. °¨ High-fat meats, such as hot dogs and fatty cuts of red and white meats, such as rib eye steak, sausage, ham, and bacon. °¨ High-fat dairy items, such as whole milk, butter, and cream cheese. °· Eat small,  frequent meals instead of large meals. °· Avoid drinking large amounts of liquid with your meals. °· Avoid eating meals during the 2-3 hours before bedtime. °· Avoid lying down right after you eat. °· Do not exercise right after you eat. ° General Instructions  °· Pay attention to any changes in your symptoms. °· Take over-the-counter and prescription medicines only as told by your health care provider. Do not take aspirin, ibuprofen, or other NSAIDs unless your health care provider told you to do so. °· Do not use any tobacco products, including cigarettes, chewing tobacco, and e-cigarettes. If you need help quitting, ask your health care provider. °· Wear loose-fitting clothing. Do not wear anything tight around your waist that causes pressure on your abdomen. °· Raise (elevate) the head of your bed 6 inches (15cm). °· Try to reduce your stress, such as with yoga or meditation. If you need help reducing stress, ask your health care provider. °· If you are overweight, reduce your weight to an amount that is healthy for you. Ask your health care provider for guidance about a safe weight loss goal. °· Keep all follow-up visits as told by your health care provider. This is important. °SEEK MEDICAL CARE IF: °· You have new symptoms. °· You have unexplained weight loss. °· You have difficulty swallowing, or it hurts to swallow. °· You have wheezing or a persistent cough. °· Your symptoms do not improve with treatment. °· You have a hoarse voice. °SEEK IMMEDIATE MEDICAL CARE IF: °· You have pain   in your arms, neck, jaw, teeth, or back. °· You feel sweaty, dizzy, or light-headed. °· You have chest pain or shortness of breath. °· You vomit and your vomit looks like blood or coffee grounds. °· You faint. °· Your stool is bloody or black. °· You cannot swallow, drink, or eat. °  °This information is not intended to replace advice given to you by your health care provider. Make sure you discuss any questions you have with  your health care provider. °  °Document Released: 12/12/2004 Document Revised: 11/23/2014 Document Reviewed: 06/29/2014 °Elsevier Interactive Patient Education ©2016 [employer] ° °Nonspecific Chest Pain  °Chest pain can be caused by many different conditions. There is always a chance that your pain could be related to something serious, such as a heart attack or a blood clot in your lungs. Chest pain can also be caused by conditions that are not life-threatening. If you have chest pain, it is very important to follow up with your health care provider. °CAUSES  °Chest pain can be caused by: °· Heartburn. °· Pneumonia or bronchitis. °· Anxiety or stress. °· Inflammation around your heart (pericarditis) or lung (pleuritis or pleurisy). °· A blood clot in your lung. °· A collapsed lung (pneumothorax). It can develop suddenly on its own (spontaneous pneumothorax) or from trauma to the chest. °· Shingles infection (varicella-zoster virus). °· Heart attack. °· Damage to the bones, muscles, and cartilage that make up your chest wall. This can include: °¨ Bruised bones due to injury. °¨ Strained muscles or cartilage due to frequent or repeated coughing or overwork. °¨ Fracture to one or more ribs. °¨ Sore cartilage due to inflammation (costochondritis). °RISK FACTORS  °Risk factors for chest pain may include: °· Activities that increase your risk for trauma or injury to your chest. °· Respiratory infections or conditions that cause frequent coughing. °· Medical conditions or overeating that can cause heartburn. °· Heart disease or family history of heart disease. °· Conditions or health behaviors that increase your risk of developing a blood clot. °· Having had chicken pox (varicella zoster). °SIGNS AND SYMPTOMS °Chest pain can feel like: °· Burning or tingling on the surface of your chest or deep in your chest. °· Crushing, pressure, aching, or squeezing pain. °· Dull or sharp pain that is worse when you move, cough, or  take a deep breath. °· Pain that is also felt in your back, neck, shoulder, or arm, or pain that spreads to any of these areas. °Your chest pain may come and go, or it may stay constant. °DIAGNOSIS °Lab tests or other studies may be needed to find the cause of your pain. Your health care provider may have you take a test called an ambulatory ECG (electrocardiogram). An ECG records your heartbeat patterns at the time the test is performed. You may also have other tests, such as: °· Transthoracic echocardiogram (TTE). During echocardiography, sound waves are used to create a picture of all of the heart structures and to look at how blood flows through your heart. °· Transesophageal echocardiogram (TEE). This is a more advanced imaging test that obtains images from inside your body. It allows your health care provider to see your heart in finer detail. °· Cardiac monitoring. This allows your health care provider to monitor your heart rate and rhythm in real time. °· Holter monitor. This is a portable device that records your heartbeat and can help to diagnose abnormal heartbeats. It allows your health care provider to track your heart activity   for several days, if needed. °· Stress tests. These can be done through exercise or by taking medicine that makes your heart beat more quickly. °· Blood tests. °· Imaging tests. °TREATMENT  °Your treatment depends on what is causing your chest pain. Treatment may include: °· Medicines. These may include: °¨ Acid blockers for heartburn. °¨ Anti-inflammatory medicine. °¨ Pain medicine for inflammatory conditions. °¨ Antibiotic medicine, if an infection is present. °¨ Medicines to dissolve blood clots. °¨ Medicines to treat coronary artery disease. °· Supportive care for conditions that do not require medicines. This may include: °¨ Resting. °¨ Applying heat or cold packs to injured areas. °¨ Limiting activities until pain decreases. °HOME CARE INSTRUCTIONS °· If you were prescribed  an antibiotic medicine, finish it all even if you start to feel better. °· Avoid any activities that bring on chest pain. °· Do not use any tobacco products, including cigarettes, chewing tobacco, or electronic cigarettes. If you need help quitting, ask your health care provider. °· Do not drink alcohol. °· Take medicines only as directed by your health care provider. °· Keep all follow-up visits as directed by your health care provider. This is important. This includes any further testing if your chest pain does not go away. °· If heartburn is the cause for your chest pain, you may be told to keep your head raised (elevated) while sleeping. This reduces the chance that acid will go from your stomach into your esophagus. °· Make lifestyle changes as directed by your health care provider. These may include: °¨ Getting regular exercise. Ask your health care provider to suggest some activities that are safe for you. °¨ Eating a heart-healthy diet. A registered dietitian can help you to learn healthy eating options. °¨ Maintaining a healthy weight. °¨ Managing diabetes, if necessary. °¨ Reducing stress. °SEEK MEDICAL CARE IF: °· Your chest pain does not go away after treatment. °· You have a rash with blisters on your chest. °· You have a fever. °SEEK IMMEDIATE MEDICAL CARE IF:  °· Your chest pain is worse. °· You have an increasing cough, or you cough up blood. °· You have severe abdominal pain. °· You have severe weakness. °· You faint. °· You have chills. °· You have sudden, unexplained chest discomfort. °· You have sudden, unexplained discomfort in your arms, back, neck, or jaw. °· You have shortness of breath at any time. °· You suddenly start to sweat, or your skin gets clammy. °· You feel nauseous or you vomit. °· You suddenly feel light-headed or dizzy. °· Your heart begins to beat quickly, or it feels like it is skipping beats. °These symptoms may represent a serious problem that is an emergency. Do not wait to  see if the symptoms will go away. Get medical help right away. Call your local emergency services (911 in the [place] Do not drive yourself to the hospital. °  °This information is not intended to replace advice given to you by your health care provider. Make sure you discuss any questions you have with your health care provider. °  °Document Released: 12/12/2004 Document Revised: 03/25/2014 Document Reviewed: 10/08/2013 °Elsevier Interactive Patient Education ©2016 [employer] Inc. ° °

## 2015-03-17 NOTE — ED Notes (Signed)
Pt with mid chest burning for two mths.

## 2015-03-29 ENCOUNTER — Ambulatory Visit: Payer: BLUE CROSS/BLUE SHIELD | Admitting: Unknown Physician Specialty

## 2015-04-28 ENCOUNTER — Encounter: Payer: Self-pay | Admitting: Unknown Physician Specialty

## 2015-04-28 ENCOUNTER — Ambulatory Visit (INDEPENDENT_AMBULATORY_CARE_PROVIDER_SITE_OTHER): Payer: BLUE CROSS/BLUE SHIELD | Admitting: Unknown Physician Specialty

## 2015-04-28 VITALS — BP 154/89 | HR 83 | Temp 98.5°F | Ht 62.2 in | Wt 220.8 lb

## 2015-04-28 DIAGNOSIS — I10 Essential (primary) hypertension: Secondary | ICD-10-CM

## 2015-04-28 DIAGNOSIS — Z0000 Encounter for general adult medical examination without abnormal findings: Secondary | ICD-10-CM | POA: Diagnosis not present

## 2015-04-28 DIAGNOSIS — R7301 Impaired fasting glucose: Secondary | ICD-10-CM

## 2015-04-28 DIAGNOSIS — R0789 Other chest pain: Secondary | ICD-10-CM | POA: Diagnosis not present

## 2015-04-28 DIAGNOSIS — E669 Obesity, unspecified: Secondary | ICD-10-CM | POA: Diagnosis not present

## 2015-04-28 LAB — BAYER DCA HB A1C WAIVED: HB A1C (BAYER DCA - WAIVED): 5.9 % (ref <7.0)

## 2015-04-28 LAB — MICROALBUMIN, URINE WAIVED
CREATININE, URINE WAIVED: 10 mg/dL (ref 10–300)
MICROALB, UR WAIVED: 10 mg/L (ref 0–19)
Microalb/Creat Ratio: <30 mg/g (ref <30)

## 2015-04-28 NOTE — Patient Instructions (Addendum)
  Chest Wall Pain Chest wall pain is pain in or around the bones and muscles of your chest. Sometimes, an injury causes this pain. Sometimes, the cause may not be known. This pain may take several weeks or longer to get better. HOME CARE INSTRUCTIONS  Pay attention to any changes in your symptoms. Take these actions to help with your pain:   Rest as told by your health care provider.   Avoid activities that cause pain. These include any activities that use your chest muscles or your abdominal and side muscles to lift heavy items.   If directed, apply ice to the painful area:  Put ice in a plastic bag.  Place a towel between your skin and the bag.  Leave the ice on for 20 minutes, 2-3 times per day.  Take over-the-counter and prescription medicines only as told by your health care provider.  Do not use tobacco products, including cigarettes, chewing tobacco, and e-cigarettes. If you need help quitting, ask your health care provider.  Keep all follow-up visits as told by your health care provider. This is important. SEEK MEDICAL CARE IF:  You have a fever.  Your chest pain becomes worse.  You have new symptoms. SEEK IMMEDIATE MEDICAL CARE IF:  You have nausea or vomiting.  You feel sweaty or light-headed.  You have a cough with phlegm (sputum) or you cough up blood.  You develop shortness of breath.   This information is not intended to replace advice given to you by your health care provider. Make sure you discuss any questions you have with your health care provider.   Document Released: 03/04/2005 Document Revised: 11/23/2014 Document Reviewed: 05/30/2014 Elsevier Interactive Patient Education 2016 [employer] Inc[employer]     Please do call to schedule your mammogram; the number to schedule one at either [facility] or [facility] is [phone]

## 2015-04-28 NOTE — Assessment & Plan Note (Addendum)
Not to goal.  This was good when she had lost weight.  She would like to lose weight again.  Recheck in 6 weeks

## 2015-04-28 NOTE — Progress Notes (Signed)
BP 154/89 mmHg  Pulse 83  Temp(Src) 98.5 F (36.9 C)  Ht 5' 2.2" (1.58 m)  Wt 220 lb 12.8 oz (100.154 kg)  BMI 40.12 kg/m2  SpO2 97%  LMP  (LMP Unknown)   Subjective:    Patient ID: Ashley Mcknight, female    DOB: 04-15-1950, 65 y.o.   MRN: [id number]  HPI: Ashley Mcknight is a 65 y.o. female  Chief Complaint  Patient presents with  . Annual Exam    pt states she is interested in cologuard  . Pain    pt states she has been having pain and itching in left breast and shoulder   Breast itching  Points to her left breast and says she is having itching  For 2 weeks off and on  Chest wall pain Points above that and demonstrates how it hurts when she moves her arm.  Pain for 3-4 days.  This happened following raking leaves  Obesity Gained her weight back.  Finds she is sitting around the house after she retired.  She is planning on going back to work part-time.   Hypertension Using medications without difficulty Average home BP not checking  No problems or lightheadedness No chest pain with exertion or shortness of breath No Edema  Past Surgical History  Procedure Laterality Date  . Abdominal hysterectomy    . Rotator cuff repair    . Cholecystectomy    . Knee surgery Left   . Carpal tunnel release Bilateral   . Toenail removal     Family History  Problem Relation Age of Onset  . Hypertension Mother   . Cancer Father     throat  . Cancer Brother    Past Medical History  Diagnosis Date  . Hyperlipidemia   . Hypertension   . Depression   . Diabetes mellitus without complication (HCC)   . Fatigue   . Gout    +k    Relevant past medical, surgical, family and social history reviewed and updated as indicated. Interim medical history since our last visit reviewed. Allergies and medications reviewed and updated.  Review of Systems  Per HPI unless specifically indicated above     Objective:    BP 154/89 mmHg  Pulse 83  Temp(Src) 98.5 F (36.9 C)   Ht 5' 2.2" (1.58 m)  Wt 220 lb 12.8 oz (100.154 kg)  BMI 40.12 kg/m2  SpO2 97%  LMP  (LMP Unknown)  Wt Readings from Last 3 Encounters:  04/28/15 220 lb 12.8 oz (100.154 kg)  03/17/15 211 lb (95.709 kg)  12/27/14 210 lb (95.255 kg)    Physical Exam  Constitutional: She is oriented to person, place, and time. She appears well-developed and well-nourished.  HENT:  Head: Normocephalic and atraumatic.  Eyes: Pupils are equal, round, and reactive to light. Right eye exhibits no discharge. Left eye exhibits no discharge. No scleral icterus.  Neck: Normal range of motion. Neck supple. Carotid bruit is not present. No thyromegaly present.  Cardiovascular: Normal rate, regular rhythm and normal heart sounds.  Exam reveals no gallop and no friction rub.   No murmur heard. Pulmonary/Chest: Effort normal and breath sounds normal. No respiratory distress. She has no wheezes. She has no rales.  Reproducible chest wall pain long left sternal border  Abdominal: Soft. Bowel sounds are normal. There is no tenderness. There is no rebound.  Genitourinary: No breast swelling, tenderness or discharge.  Musculoskeletal: Normal range of motion.  Lymphadenopathy:    She has  no cervical adenopathy.  Neurological: She is alert and oriented to person, place, and time.  Skin: Skin is warm, dry and intact. No rash noted.  Psychiatric: She has a normal mood and affect. Her speech is normal and behavior is normal. Judgment and thought content normal. Cognition and memory are normal.    Results for orders placed or performed during the hospital encounter of 03/17/15  CBC with Differential  Result Value Ref Range   WBC 7.9 3.6 - 11.0 K/uL   RBC 5.12 3.80 - 5.20 MIL/uL   Hemoglobin 13.4 12.0 - 16.0 g/dL   HCT 16.1 09.6 - 04.5 %   MCV 80.1 80.0 - 100.0 fL   MCH 26.2 26.0 - 34.0 pg   MCHC 32.6 32.0 - 36.0 g/dL   RDW 40.9 81.1 - 91.4 %   Platelets 267 150 - 440 K/uL   Neutrophils Relative % 49 %   Neutro Abs  4.0 1.4 - 6.5 K/uL   Lymphocytes Relative 35 %   Lymphs Abs 2.8 1.0 - 3.6 K/uL   Monocytes Relative 9 %   Monocytes Absolute 0.7 0.2 - 0.9 K/uL   Eosinophils Relative 6 %   Eosinophils Absolute 0.4 0 - 0.7 K/uL   Basophils Relative 1 %   Basophils Absolute 0.0 0 - 0.1 K/uL  Comprehensive metabolic panel  Result Value Ref Range   Sodium 136 135 - 145 mmol/L   Potassium 3.6 3.5 - 5.1 mmol/L   Chloride 103 101 - 111 mmol/L   CO2 26 22 - 32 mmol/L   Glucose, Bld 105 (H) 65 - 99 mg/dL   BUN 15 6 - 20 mg/dL   Creatinine, Ser 7.82 0.44 - 1.00 mg/dL   Calcium 9.4 8.9 - 95.6 mg/dL   Total Protein 8.1 6.5 - 8.1 g/dL   Albumin 4.4 3.5 - 5.0 g/dL   AST 15 15 - 41 U/L   ALT 12 (L) 14 - 54 U/L   Alkaline Phosphatase 76 38 - 126 U/L   Total Bilirubin 0.4 0.3 - 1.2 mg/dL   GFR calc non Af Amer >60 >60 mL/min   GFR calc Af Amer >60 >60 mL/min   Anion gap 7 5 - 15  Lipase, blood  Result Value Ref Range   Lipase 27 11 - 51 U/L  Troponin I  Result Value Ref Range   Troponin I <0.03 <0.031 ng/mL      Assessment & Plan:   Problem List Items Addressed This Visit      Unprioritized   Hypertension    Not to goal.  This was good when she had lost weight.  She would like to lose weight again.  Recheck in 6 weeks      Relevant Orders   Comprehensive metabolic panel   Lipid Panel w/o Chol/HDL Ratio   Microalbumin, Urine Waived   Uric acid   Impaired fasting glucose    Check Hgb A1C      Relevant Orders   Bayer DCA Hb A1c Waived   Obesity - Primary    Will work on losing weight       Other Visit Diagnoses    Acute chest wall pain        Pt ed    Routine general medical examination at a health care facility        Relevant Orders    HIV antibody    Hepatitis C antibody    CBC with Differential/Platelet    TSH  MM DIGITAL SCREENING BILATERAL        Follow up plan: Return in about 6 weeks (around 06/09/2015).

## 2015-04-28 NOTE — Assessment & Plan Note (Signed)
Check Hgb A1C 

## 2015-04-28 NOTE — Assessment & Plan Note (Signed)
Will work on losing weight

## 2015-04-29 LAB — HEPATITIS C ANTIBODY: HEP C VIRUS AB: 0.2 {s_co_ratio} (ref 0.0–0.9)

## 2015-04-29 LAB — CBC WITH DIFFERENTIAL/PLATELET
BASOS: 0 %
Basophils Absolute: 0 10*3/uL (ref 0.0–0.2)
EOS (ABSOLUTE): 0.3 10*3/uL (ref 0.0–0.4)
Eos: 3 %
HEMOGLOBIN: 12.4 g/dL (ref 11.1–15.9)
Hematocrit: 38.5 % (ref 34.0–46.6)
IMMATURE GRANS (ABS): 0 10*3/uL (ref 0.0–0.1)
Immature Granulocytes: 0 %
LYMPHS ABS: 3.6 10*3/uL — AB (ref 0.7–3.1)
LYMPHS: 35 %
MCH: 26 pg — AB (ref 26.6–33.0)
MCHC: 32.2 g/dL (ref 31.5–35.7)
MCV: 81 fL (ref 79–97)
Monocytes Absolute: 0.8 10*3/uL (ref 0.1–0.9)
Monocytes: 8 %
NEUTROS ABS: 5.4 10*3/uL (ref 1.4–7.0)
Neutrophils: 54 %
Platelets: 289 10*3/uL (ref 150–379)
RBC: 4.77 x10E6/uL (ref 3.77–5.28)
RDW: 14.2 % (ref 12.3–15.4)
WBC: 10 10*3/uL (ref 3.4–10.8)

## 2015-04-29 LAB — COMPREHENSIVE METABOLIC PANEL
A/G RATIO: 1.3 (ref 1.1–2.5)
ALBUMIN: 4 g/dL (ref 3.6–4.8)
ALT: 11 IU/L (ref 0–32)
AST: 11 IU/L (ref 0–40)
Alkaline Phosphatase: 75 IU/L (ref 39–117)
BUN / CREAT RATIO: 14 (ref 11–26)
BUN: 13 mg/dL (ref 8–27)
CALCIUM: 9.7 mg/dL (ref 8.7–10.3)
CO2: 25 mmol/L (ref 18–29)
Chloride: 98 mmol/L (ref 96–106)
Creatinine, Ser: 0.9 mg/dL (ref 0.57–1.00)
GFR, EST AFRICAN AMERICAN: 79 mL/min/{1.73_m2} (ref >59)
GFR, EST NON AFRICAN AMERICAN: 69 mL/min/{1.73_m2} (ref >59)
GLOBULIN, TOTAL: 3.1 g/dL (ref 1.5–4.5)
Glucose: 87 mg/dL (ref 65–99)
POTASSIUM: 4.4 mmol/L (ref 3.5–5.2)
Sodium: 140 mmol/L (ref 134–144)
TOTAL PROTEIN: 7.1 g/dL (ref 6.0–8.5)

## 2015-04-29 LAB — LIPID PANEL W/O CHOL/HDL RATIO
CHOLESTEROL TOTAL: 182 mg/dL (ref 100–199)
HDL: 63 mg/dL (ref >39)
LDL Calculated: 89 mg/dL (ref 0–99)
TRIGLYCERIDES: 152 mg/dL — AB (ref 0–149)
VLDL CHOLESTEROL CAL: 30 mg/dL (ref 5–40)

## 2015-04-29 LAB — URIC ACID: Uric Acid: 5.7 mg/dL (ref 2.5–7.1)

## 2015-04-29 LAB — TSH: TSH: 0.913 u[IU]/mL (ref 0.450–4.500)

## 2015-04-29 LAB — HIV ANTIBODY (ROUTINE TESTING W REFLEX): HIV Screen 4th Generation wRfx: NONREACTIVE

## 2015-05-01 ENCOUNTER — Encounter: Payer: Self-pay | Admitting: Unknown Physician Specialty

## 2015-05-01 NOTE — Progress Notes (Signed)
Quick Note:  Normal labs. Patient notified by letter. ______ 

## 2015-05-05 ENCOUNTER — Encounter: Payer: Self-pay | Admitting: Emergency Medicine

## 2015-05-05 ENCOUNTER — Emergency Department
Admission: EM | Admit: 2015-05-05 | Discharge: 2015-05-05 | Disposition: A | Discharge status: Home / Self Care | Payer: BLUE CROSS/BLUE SHIELD | Attending: Emergency Medicine | Admitting: Emergency Medicine

## 2015-05-05 ENCOUNTER — Emergency Department: Payer: BLUE CROSS/BLUE SHIELD

## 2015-05-05 DIAGNOSIS — G8929 Other chronic pain: Secondary | ICD-10-CM | POA: Diagnosis not present

## 2015-05-05 DIAGNOSIS — Z7982 Long term (current) use of aspirin: Secondary | ICD-10-CM | POA: Diagnosis not present

## 2015-05-05 DIAGNOSIS — Z79899 Other long term (current) drug therapy: Secondary | ICD-10-CM | POA: Insufficient documentation

## 2015-05-05 DIAGNOSIS — E119 Type 2 diabetes mellitus without complications: Secondary | ICD-10-CM | POA: Diagnosis not present

## 2015-05-05 DIAGNOSIS — M1712 Unilateral primary osteoarthritis, left knee: Secondary | ICD-10-CM | POA: Diagnosis not present

## 2015-05-05 DIAGNOSIS — I10 Essential (primary) hypertension: Secondary | ICD-10-CM | POA: Diagnosis not present

## 2015-05-05 DIAGNOSIS — M25562 Pain in left knee: Secondary | ICD-10-CM | POA: Diagnosis present

## 2015-05-05 MED ORDER — OXYCODONE-ACETAMINOPHEN 5-325 MG PO TABS: 1.0000 | ORAL_TABLET | Freq: Four times a day (QID) | ORAL | Status: DC | PRN

## 2015-05-05 MED ORDER — MELOXICAM 15 MG PO TABS: 15.0000 mg | ORAL_TABLET | Freq: Every day | ORAL | Status: DC

## 2015-05-05 MED ORDER — OXYCODONE-ACETAMINOPHEN 5-325 MG PO TABS
1.0000 | ORAL_TABLET | Freq: Once | ORAL | Status: AC
  Administered 2015-05-05: 1 via ORAL

## 2015-05-05 NOTE — ED Provider Notes (Signed)
[facility] Emergency Department Provider Note ____________________________________________  Time seen: Approximately 11:36 PM  I have reviewed the triage vital signs and the nursing notes.   HISTORY  Chief Complaint Knee Pain    HPI Kareli Hossain is a 65 y.o. female who presents to the emergency department for evaluation of left knee pain. She states that she has not had any recent injuries that she is aware of. There is mild swelling to the knee as well. She has had no relief from ibuprofen or Aleve. She does have a history of osteoarthritis of the knee and was evaluated about 2 months ago at [facility]. They recommended injection of "gel," but she has not had it done. This pain is similar to the chronic pain, but worse and resistant to treatment with NSAIDs.  Past Medical History  Diagnosis Date  . Hyperlipidemia   . Hypertension   . Depression   . Diabetes mellitus without complication (HCC)   . Fatigue   . Gout     Patient Active Problem List   Diagnosis Date Noted  . Hypertension 09/15/2014  . Sleep related leg cramps 09/15/2014  . Impaired fasting glucose 09/15/2014  . Obesity 09/15/2014    Past Surgical History  Procedure Laterality Date  . Abdominal hysterectomy    . Rotator cuff repair    . Cholecystectomy    . Knee surgery Left   . Carpal tunnel release Bilateral   . Toenail removal      Current Outpatient Rx  Name  Route  Sig  Dispense  Refill  . aspirin 81 MG tablet   Oral   Take 81 mg by mouth daily.         . hydrochlorothiazide (HYDRODIURIL) 25 MG tablet   Oral   Take 1 tablet (25 mg total) by mouth daily.   90 tablet   1   . lisinopril (PRINIVIL,ZESTRIL) 40 MG tablet   Oral   Take 1 tablet (40 mg total) by mouth daily.   90 tablet   1   . meloxicam (MOBIC) 15 MG tablet   Oral   Take 1 tablet (15 mg total) by mouth daily.   30 tablet   2   . oxyCODONE-acetaminophen (ROXICET) 5-325 MG tablet    Oral   Take 1 tablet by mouth every 6 (six) hours as needed.   20 tablet   0   . ranitidine (ZANTAC) 150 MG capsule   Oral   Take 1 capsule (150 mg total) by mouth 2 (two) times daily.   28 capsule   0   . sucralfate (CARAFATE) 1 g tablet   Oral   Take 1 tablet (1 g total) by mouth 4 (four) times daily.   120 tablet   1     Allergies Review of patient's allergies indicates no known allergies.  Family History  Problem Relation Age of Onset  . Hypertension Mother   . Cancer Father     throat  . Cancer Brother     Social History Social History  Substance Use Topics  . Smoking status: Never Smoker   . Smokeless tobacco: Never Used  . Alcohol Use: No    Review of Systems Constitutional: No recent illness. Cardiovascular: Denies chest pain or palpitations. Respiratory: Denies shortness of breath. Gastrointestinal: No abdominal pain.  Musculoskeletal: Pain in left knee Skin: Negative for rash. Neurological: Negative for headaches, focal weakness or numbness. 10-point ROS otherwise unremarkable.  ____________________________________________   PHYSICAL EXAM:  VITAL SIGNS: ED Triage Vitals  Enc Vitals Group     BP 05/05/15 2207 149/71 mmHg     Pulse Rate 05/05/15 2207 89     Resp 05/05/15 2207 20     Temp 05/05/15 2207 98.6 F (37 C)     Temp Source 05/05/15 2207 Oral     SpO2 05/05/15 2207 96 %     Weight 05/05/15 2207 220 lb (99.791 kg)     Height 05/05/15 2207  (1.626 m)     Head Cir --      Peak Flow --      Pain Score 05/05/15 2213 6     Pain Loc --      Pain Edu? --      Excl. in GC? --     Constitutional: Alert and oriented. Well appearing and in no acute distress. Eyes: Conjunctivae are normal. EOMI. Head: Atraumatic. Nose: No congestion/rhinnorhea. Neck: No stridor.  Respiratory: Normal respiratory effort.   Musculoskeletal: Very limited range of motion of the left knee due to pain. Pain is located mostly on the medial aspect. The  knee is mildly edematous without erythema or fluctuance. Neurologic:  Normal speech and language. No gross focal neurologic deficits are appreciated. Speech is normal. No gait instability. Skin:  Skin is warm, dry and intact. Atraumatic. Psychiatric: Mood and affect are normal. Speech and behavior are normal.  ____________________________________________   LABS (all labs ordered are listed, but only abnormal results are displayed)  Labs Reviewed - No data to display ____________________________________________  RADIOLOGY Tri compartmental osteoarthritis of the left knee per radiology ____________________________________________   PROCEDURES  Procedure(s) performed: None   ____________________________________________   INITIAL IMPRESSION / ASSESSMENT AND PLAN / ED COURSE  Pertinent labs & imaging results that were available during my care of the patient were reviewed by me and considered in my medical decision making (see chart for details).  Patient was advised to use B cane that she has at home to assist her with ambulation. She was advised to call and schedule a follow-up with orthopedics. She will be given a prescription for meloxicam to be taken daily and Percocet to be taken for severe pain. She was advised to return to the emergency department for symptoms that change or worsen if she is unable to schedule an appointment with orthopedics. ____________________________________________   FINAL CLINICAL IMPRESSION(S) / ED DIAGNOSES  Final diagnoses:  Osteoarthritis of left knee, unspecified osteoarthritis type       Chinita Pester, FNP 05/05/15 9604  Jene Every, MD 05/07/15 0000

## 2015-05-05 NOTE — Discharge Instructions (Signed)

## 2015-05-05 NOTE — ED Notes (Signed)
Pt states left knee pain for one day. Pt denies known injury, motor and sensation intact to toes per pt. 2+ pulse noted. Slight swelling noted to knee. Pt ambulatory without difficulty.

## 2015-05-10 ENCOUNTER — Ambulatory Visit
Admission: RE | Admit: 2015-05-10 | Discharge: 2015-05-10 | Disposition: A | Discharge status: Home / Self Care | Payer: BLUE CROSS/BLUE SHIELD | Source: Ambulatory Visit | Attending: Unknown Physician Specialty | Admitting: Unknown Physician Specialty

## 2015-05-10 ENCOUNTER — Other Ambulatory Visit: Payer: Self-pay | Admitting: Unknown Physician Specialty

## 2015-05-10 DIAGNOSIS — N6452 Nipple discharge: Secondary | ICD-10-CM | POA: Diagnosis present

## 2015-05-10 DIAGNOSIS — Z0000 Encounter for general adult medical examination without abnormal findings: Secondary | ICD-10-CM

## 2015-06-06 ENCOUNTER — Telehealth: Payer: Self-pay

## 2015-06-06 NOTE — Telephone Encounter (Signed)
error 

## 2015-06-09 ENCOUNTER — Ambulatory Visit: Payer: BLUE CROSS/BLUE SHIELD | Admitting: Unknown Physician Specialty

## 2015-07-31 ENCOUNTER — Other Ambulatory Visit: Payer: Self-pay | Admitting: Unknown Physician Specialty

## 2015-09-06 ENCOUNTER — Other Ambulatory Visit: Payer: Self-pay | Admitting: Unknown Physician Specialty

## 2015-11-23 ENCOUNTER — Ambulatory Visit: Payer: BLUE CROSS/BLUE SHIELD | Admitting: Family Medicine

## 2015-11-23 ENCOUNTER — Encounter: Payer: Self-pay | Admitting: Family Medicine

## 2015-11-23 ENCOUNTER — Ambulatory Visit (INDEPENDENT_AMBULATORY_CARE_PROVIDER_SITE_OTHER): Payer: BLUE CROSS/BLUE SHIELD | Admitting: Family Medicine

## 2015-11-23 VITALS — BP 130/83 | HR 98 | Temp 98.5°F | Wt 226.0 lb

## 2015-11-23 DIAGNOSIS — N643 Galactorrhea not associated with childbirth: Secondary | ICD-10-CM

## 2015-11-23 DIAGNOSIS — Z23 Encounter for immunization: Secondary | ICD-10-CM

## 2015-11-23 MED ORDER — TRIAMCINOLONE ACETONIDE 0.1 % EX CREA: 1.0000 "application " | TOPICAL_CREAM | Freq: Two times a day (BID) | CUTANEOUS | 0 refills | Status: DC

## 2015-11-23 NOTE — Patient Instructions (Signed)
Follow up for routine labs and check up at your earliest convenience

## 2015-11-23 NOTE — [facility] Notes (Signed)
BP 130/83   Pulse 98   Temp 98.5 F (36.9 C)   Wt 226 lb (102.5 kg)   LMP  (LMP Unknown)   SpO2 (!) 81%   BMI 38.79 kg/m    Subjective:    Patient ID: Ashley Mcknight, female    DOB: Jun 17, 1950, 65 y.o.   MRN: [id number]  HPI: Ashley Mcknight is a 65 y.o. female  Chief Complaint  Patient presents with  . Breast Problem    left breast x6 months, some dischage was coming out of the nipple and areola. Itches some. Sometimes feels heavy. She did have mammo for this problem back in Feb.    Patient presents to follow up on about 6 months of cloudy left breast discharge, heaviness, and itching. Has had mammogram and left breast u/s back in February with benign result but it was recommended that MRI be considered if symptoms persist. The itching and discharge seem to occur together, not every day but frequently. Two nieces have had breast cancer, but no other known fhx or personal history. Performs almost daily self breast exams and states she has never felt any lumps or bumps. Skin is scaly on areola where she c/o itching, has been putting alcohol on the area to help relieve the itch.   Relevant past medical, surgical, family and social history reviewed and updated as indicated. Interim medical history since our last visit reviewed. Allergies and medications reviewed and updated.  Review of Systems  Constitutional: Negative.   HENT: Negative.   Respiratory: Negative.   Cardiovascular: Negative.   Gastrointestinal: Negative.   Genitourinary: Negative.   Skin:       Flaky area on left areola  Neurological: Negative.   Psychiatric/Behavioral: Negative.     Per HPI unless specifically indicated above     Objective:    BP 130/83   Pulse 98   Temp 98.5 F (36.9 C)   Wt 226 lb (102.5 kg)   LMP  (LMP Unknown)   SpO2 (!) 81%   BMI 38.79 kg/m   Wt Readings from Last 3 Encounters:  11/23/15 226 lb (102.5 kg)  05/05/15 220 lb (99.8 kg)  04/28/15 220 lb 12.8 oz (100.2 kg)      Physical Exam  Constitutional: She is oriented to person, place, and time. She appears well-developed and well-nourished.  HENT:  Head: Atraumatic.  Eyes: Conjunctivae are normal. No scleral icterus.  Neck: Normal range of motion. Neck supple.  Cardiovascular: Normal rate and normal heart sounds.   Pulmonary/Chest: Effort normal and breath sounds normal. No respiratory distress. She exhibits no tenderness. Right breast exhibits no inverted nipple, no mass, no nipple discharge, no skin change and no tenderness. Left breast exhibits no inverted nipple, no mass and no tenderness.    Musculoskeletal: Normal range of motion.  Lymphadenopathy:    She has no axillary adenopathy.  Neurological: She is alert and oriented to person, place, and time.  Skin: Skin is warm and dry.  Psychiatric: She has a normal mood and affect. Her behavior is normal.  Nursing note and vitals reviewed.     Assessment & Plan:   Problem List Items Addressed This Visit    None    Visit Diagnoses    Galactorrhea    -  Primary   TSH and prolactin levels ordered, await results. Referral to breast specialist placed for further eval/imaging as needed   Relevant Orders   Prolactin   TSH   Ambulatory referral to  General Surgery   Needs flu shot           Follow up plan: Return for Routine follow up as soon as possible. Discussed that she was overdue for routine 6 month follow up, she is agreeable to returning for this soon.

## 2015-11-24 LAB — PROLACTIN: PROLACTIN: 6.2 ng/mL (ref 4.8–23.3)

## 2015-11-24 LAB — TSH: TSH: 1.12 u[IU]/mL (ref 0.450–4.500)

## 2015-11-27 ENCOUNTER — Ambulatory Visit (INDEPENDENT_AMBULATORY_CARE_PROVIDER_SITE_OTHER): Payer: BLUE CROSS/BLUE SHIELD | Admitting: General Surgery

## 2015-11-27 ENCOUNTER — Encounter: Payer: Self-pay | Admitting: General Surgery

## 2015-11-27 ENCOUNTER — Encounter: Payer: Self-pay | Admitting: Family Medicine

## 2015-11-27 VITALS — BP 130/72 | Resp 14 | Ht 64.0 in | Wt 227.0 lb

## 2015-11-27 DIAGNOSIS — N6452 Nipple discharge: Secondary | ICD-10-CM | POA: Insufficient documentation

## 2015-11-27 NOTE — [facility] Notes (Signed)
Patient ID: Ashley Mcknight, female   DOB: 1951-02-13, 65 y.o.   MRN: [id number]  Chief Complaint  Patient presents with  . Other    galactorrhea.    HPI Ashley Mcknight is a 65 y.o. female here today for galactorrhea.  Patient states she has been having left breast nipple discharge/itchyness for 6 months, now the discharge is coming from her areola area. She states the left breast did feel heavy one week ago.The last time she noticed discharge was last week. She describes the discharge as milky.  Denies pain and fevers. Patient states she has been applying Kenalog to the area on her left breast which has relieved the itching and drainage. She has been applying this since 11/23/15.  Denies injury to the breast.   She states her primary care provider Phineas Inches ordered some labs to see if anything else is going on.   Patient retired in September from [employer]. 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  Patient last mammogram was on 04/20/15.  HPI  Past Medical History:  Diagnosis Date  . Depression   . Diabetes mellitus without complication (HCC)   . Fatigue   . Gout   . Hyperlipidemia   . Hypertension     Past Surgical History:  Procedure Laterality Date  . ABDOMINAL HYSTERECTOMY    . APPENDECTOMY  2011  . CARPAL TUNNEL RELEASE Bilateral   . CHOLECYSTECTOMY    . KNEE SURGERY Left   . ROTATOR CUFF REPAIR    . toenail removal      Family History  Problem Relation Age of Onset  . Hypertension Mother   . Cancer Father     throat  . Cancer Brother     Social History Social History  Substance Use Topics  . Smoking status: Never Smoker  . Smokeless tobacco: Never Used  . Alcohol use No    No Known Allergies  Current Outpatient Prescriptions  Medication Sig Dispense Refill  . aspirin 81 MG tablet Take 81 mg by mouth daily.    . hydrochlorothiazide (HYDRODIURIL) 25 MG tablet TAKE 1 TABLET (25 MG TOTAL) BY MOUTH DAILY. 90 tablet 1   . lisinopril (PRINIVIL,ZESTRIL) 40 MG tablet TAKE 1 TABLET (40 MG TOTAL) BY MOUTH DAILY. 90 tablet 1  . meloxicam (MOBIC) 15 MG tablet TAKE 1 TABLET BY MOUTH EVERY DAY 30 tablet 2  . ranitidine (ZANTAC) 150 MG capsule Take 1 capsule (150 mg total) by mouth 2 (two) times daily. 28 capsule 0  . sucralfate (CARAFATE) 1 g tablet Take 1 tablet (1 g total) by mouth 4 (four) times daily. 120 tablet 1  . traMADol (ULTRAM) 50 MG tablet Take 50 mg by mouth as needed.    . triamcinolone cream (KENALOG) 0.1 % Apply 1 application topically 2 (two) times daily. 30 g 0   No current facility-administered medications for this visit.     Review of Systems Review of Systems  Constitutional: Negative.   Respiratory: Negative.   Cardiovascular: Negative.     Blood pressure 130/72, resp. rate 14, height 5\' 4"  (1.626 m), weight 227 lb (103 kg).  Physical Exam Physical Exam  Constitutional: She is oriented to person, place, and time. She appears well-developed and well-nourished.  Eyes: Conjunctivae are normal. No scleral icterus.  Neck: Neck supple.  Cardiovascular: Normal rate, regular rhythm and normal heart sounds.   Pulmonary/Chest: Effort normal and breath sounds normal. Right breast exhibits no inverted nipple,  no mass, no nipple discharge, no skin change and no tenderness. Left breast exhibits no inverted nipple, no mass, no nipple discharge, no skin change and no tenderness.    Mild thickening in the areolar skin at 3 o'clock.   No breast masses noted.  No nipple discharge elicited.  No evidence of inflammation around the accessory glands of [place].  Abdominal: Soft. Bowel sounds are normal. There is no tenderness.  Lymphadenopathy:    She has no cervical adenopathy.  Neurological: She is alert and oriented to person, place, and time.  Skin: Skin is warm and dry.    Data Reviewed Imaging studies from 05/10/2015 were not available in the retrieval system 4 individual  review.  Mammograms and ultrasound reported as negative. BI-RADS-1.  Assessment    History nipple drainage, now resolved.  Faint focal thickening at the lateral edge of the areola at the 3:00 position, pitcher not typical for Paget's disease.    Plan          Continue applying Kenalog cream to the area for one week  twice a day. Patient will follow up in one month.     Patient advised to call if the area worsens after stopping the cream.  This information has been scribed by Milas Kocherebeca Morris, CMA    Earline MayotteByrnett, Alaina Donati W 11/27/2015, 10:49 AM

## 2015-11-27 NOTE — Patient Instructions (Addendum)
Patient will follow up in one month. Continue applying Kenalog cream to the area for one week twice a day. If the area worsens call the office.

## 2015-12-12 ENCOUNTER — Ambulatory Visit: Payer: BLUE CROSS/BLUE SHIELD | Admitting: Unknown Physician Specialty

## 2015-12-26 ENCOUNTER — Encounter: Payer: Self-pay | Admitting: General Surgery

## 2015-12-28 ENCOUNTER — Ambulatory Visit: Payer: BLUE CROSS/BLUE SHIELD | Admitting: General Surgery

## 2016-01-09 ENCOUNTER — Ambulatory Visit: Payer: BLUE CROSS/BLUE SHIELD | Admitting: General Surgery

## 2016-01-11 ENCOUNTER — Encounter: Payer: Self-pay | Admitting: *Deleted

## 2016-01-25 ENCOUNTER — Encounter: Payer: Self-pay | Admitting: General Surgery

## 2016-01-25 ENCOUNTER — Ambulatory Visit (INDEPENDENT_AMBULATORY_CARE_PROVIDER_SITE_OTHER): Payer: BLUE CROSS/BLUE SHIELD | Admitting: General Surgery

## 2016-01-25 VITALS — BP 132/88 | HR 82 | Resp 14 | Ht 64.0 in | Wt 224.0 lb

## 2016-01-25 DIAGNOSIS — N6452 Nipple discharge: Secondary | ICD-10-CM

## 2016-01-25 NOTE — [facility] Notes (Signed)
Patient ID: Ashley SpiresLinda Harris Mcknight, female   DOB: [birth year], 65 y.o.   MRN: [id number]  Chief Complaint  Patient presents with  . Follow-up    left nipple drainage    HPI Ashley SpiresLinda Harris Mcknight is a 65 y.o. female.  Here today for follow up left nipple drainage and itching. She states it stopped about 2-3 days after using the Kenalog cream.   HPI  Past Medical History:  Diagnosis Date  . Depression   . Diabetes mellitus without complication (HCC)   . Fatigue   . Gout   . Hyperlipidemia   . Hypertension     Past Surgical History:  Procedure Laterality Date  . ABDOMINAL HYSTERECTOMY    . APPENDECTOMY  2011  . CARPAL TUNNEL RELEASE Bilateral   . CHOLECYSTECTOMY    . KNEE SURGERY Left   . ROTATOR CUFF REPAIR    . toenail removal      Family History  Problem Relation Age of Onset  . Hypertension Mother   . Cancer Father     throat  . Cancer Brother     Social History Social History  Substance Use Topics  . Smoking status: Never Smoker  . Smokeless tobacco: Never Used  . Alcohol use No    No Known Allergies  Current Outpatient Prescriptions  Medication Sig Dispense Refill  . aspirin 81 MG tablet Take 81 mg by mouth daily.    . hydrochlorothiazide (HYDRODIURIL) 25 MG tablet TAKE 1 TABLET (25 MG TOTAL) BY MOUTH DAILY. 90 tablet 1  . lisinopril (PRINIVIL,ZESTRIL) 40 MG tablet TAKE 1 TABLET (40 MG TOTAL) BY MOUTH DAILY. 90 tablet 1  . meloxicam (MOBIC) 15 MG tablet TAKE 1 TABLET BY MOUTH EVERY DAY 30 tablet 2  . ranitidine (ZANTAC) 150 MG capsule Take 1 capsule (150 mg total) by mouth 2 (two) times daily. 28 capsule 0  . sucralfate (CARAFATE) 1 g tablet Take 1 tablet (1 g total) by mouth 4 (four) times daily. 120 tablet 1  . traMADol (ULTRAM) 50 MG tablet Take 50 mg by mouth as needed.    . triamcinolone cream (KENALOG) 0.1 % Apply 1 application topically 2 (two) times daily. 30 g 0   No current facility-administered medications for this visit.     Review of  Systems Review of Systems  Constitutional: Negative.   Respiratory: Negative.   Cardiovascular: Negative.     Blood pressure 132/88, pulse 82, resp. rate 14, height 5\' 4"  (1.626 m), weight 224 lb (101.6 kg).  Physical Exam Physical Exam  Constitutional: She is oriented to person, place, and time. She appears well-developed and well-nourished.  Pulmonary/Chest: Right breast exhibits no inverted nipple, no mass, no nipple discharge, no skin change and no tenderness. Left breast exhibits no inverted nipple, no mass, no nipple discharge, no skin change and no tenderness.    Neurological: She is alert and oriented to person, place, and time.  Skin: Skin is warm and dry.  Psychiatric: Her behavior is normal.      Assessment    Benign breast areola/skin exam.    Plan    The patient will make use of Kenalog cream when necessary if she has recurrent symptoms. If they do not resolve within 48 hours she was encouraged to call for assessment. She develops pain or recurrent drainage she was encouraged to call for assessment.    The patient is aware to call back for any questions, reoccurrence of symptoms or new concerns.   This information has  been scribed by Dorathy DaftMarsha Josephine Wooldridge RN, BSN,BC.   Earline MayotteByrnett, Jeffrey W 01/25/2016, 8:02 PM

## 2016-01-25 NOTE — Patient Instructions (Addendum)
  The patient is aware to call back for any questions, reoccurrence of symptoms or new concerns.

## 2016-02-04 ENCOUNTER — Other Ambulatory Visit: Payer: Self-pay | Admitting: Unknown Physician Specialty

## 2016-06-21 ENCOUNTER — Other Ambulatory Visit: Payer: Self-pay | Admitting: Unknown Physician Specialty

## 2016-08-05 ENCOUNTER — Other Ambulatory Visit: Payer: Self-pay | Admitting: Unknown Physician Specialty

## 2016-08-07 ENCOUNTER — Encounter: Payer: Self-pay | Admitting: Family Medicine

## 2016-08-07 ENCOUNTER — Other Ambulatory Visit: Payer: Self-pay | Admitting: Unknown Physician Specialty

## 2016-08-07 ENCOUNTER — Ambulatory Visit (INDEPENDENT_AMBULATORY_CARE_PROVIDER_SITE_OTHER): Payer: BLUE CROSS/BLUE SHIELD | Admitting: Family Medicine

## 2016-08-07 VITALS — BP 114/73 | HR 81 | Temp 98.4°F | Wt 215.0 lb

## 2016-08-07 DIAGNOSIS — I10 Essential (primary) hypertension: Secondary | ICD-10-CM

## 2016-08-07 DIAGNOSIS — Z1231 Encounter for screening mammogram for malignant neoplasm of breast: Secondary | ICD-10-CM

## 2016-08-07 MED ORDER — LISINOPRIL 40 MG PO TABS: ORAL_TABLET | ORAL | 1 refills | Status: DC

## 2016-08-07 MED ORDER — HYDROCHLOROTHIAZIDE 25 MG PO TABS: ORAL_TABLET | ORAL | 1 refills | Status: DC

## 2016-08-07 NOTE — [facility] Notes (Signed)
   BP 114/73   Pulse 81   Temp 98.4 F (36.9 C)   Wt 215 lb (97.5 kg)   LMP  (LMP Unknown)   SpO2 100%   BMI 36.90 kg/m    Subjective:    Patient ID: Ashley Mcknight, female    DOB: July 13, 1950, 66 y.o.   MRN: [id number]  HPI: Ashley Mcknight is a 66 y.o. female  Chief Complaint  Patient presents with  . Hypertension    Patient doing well. Has been walking and watching her diet. She needs refills on HCTZ and Lisinopril.    Patient here for HTN f/u. Doing well on current regimen, no concerns today. Denies side effects, takes medicine faithfully. Denies HA, CP, SOB, dizziness, visual changes. Walking regularly and following DASH diet loosely.   Relevant past medical, surgical, family and social history reviewed and updated as indicated. Interim medical history since our last visit reviewed. Allergies and medications reviewed and updated.  Review of Systems  Constitutional: Negative.   HENT: Negative.   Respiratory: Negative.   Cardiovascular: Negative.   Gastrointestinal: Negative.   Genitourinary: Negative.   Musculoskeletal: Negative.   Neurological: Negative.   Psychiatric/Behavioral: Negative.     Per HPI unless specifically indicated above     Objective:    BP 114/73   Pulse 81   Temp 98.4 F (36.9 C)   Wt 215 lb (97.5 kg)   LMP  (LMP Unknown)   SpO2 100%   BMI 36.90 kg/m   Wt Readings from Last 3 Encounters:  08/07/16 215 lb (97.5 kg)  01/25/16 224 lb (101.6 kg)  11/27/15 227 lb (103 kg)    Physical Exam  Constitutional: She is oriented to person, place, and time. She appears well-developed and well-nourished.  HENT:  Head: Atraumatic.  Eyes: Conjunctivae are normal. Pupils are equal, round, and reactive to light.  Neck: Normal range of motion. Neck supple.  Cardiovascular: Normal rate and normal heart sounds.   Pulmonary/Chest: Effort normal and breath sounds normal. No respiratory distress.  Musculoskeletal: Normal range of motion.    Neurological: She is alert and oriented to person, place, and time.  Skin: Skin is warm and dry.  Psychiatric: She has a normal mood and affect. Her behavior is normal.  Nursing note and vitals reviewed.     Assessment & Plan:   Problem List Items Addressed This Visit      Cardiovascular and Mediastinum   Hypertension - Primary    Stable, well controlled. Continue current regimen. Continue DASH diet and regular exercise.       Relevant Medications   lisinopril (PRINIVIL,ZESTRIL) 40 MG tablet   hydrochlorothiazide (HYDRODIURIL) 25 MG tablet   Other Relevant Orders   Basic Metabolic Panel (BMET) (Completed)       Follow up plan: Return in about 6 months (around 02/07/2017) for CPE.

## 2016-08-08 ENCOUNTER — Encounter: Payer: Self-pay | Admitting: Family Medicine

## 2016-08-08 LAB — BASIC METABOLIC PANEL
BUN/Creatinine Ratio: 17 (ref 12–28)
BUN: 15 mg/dL (ref 8–27)
CALCIUM: 9.2 mg/dL (ref 8.7–10.3)
CO2: 26 mmol/L (ref 18–29)
Chloride: 98 mmol/L (ref 96–106)
Creatinine, Ser: 0.88 mg/dL (ref 0.57–1.00)
GFR calc Af Amer: 81 mL/min/{1.73_m2} (ref >59)
GFR calc non Af Amer: 70 mL/min/{1.73_m2} (ref >59)
GLUCOSE: 71 mg/dL (ref 65–99)
Potassium: 4.2 mmol/L (ref 3.5–5.2)
Sodium: 139 mmol/L (ref 134–144)

## 2016-08-09 NOTE — Assessment & Plan Note (Signed)
Stable, well controlled. Continue current regimen. Continue DASH diet and regular exercise.

## 2016-10-27 ENCOUNTER — Emergency Department (HOSPITAL_COMMUNITY): Payer: BLUE CROSS/BLUE SHIELD

## 2016-10-27 ENCOUNTER — Emergency Department (HOSPITAL_COMMUNITY)
Admission: EM | Admit: 2016-10-27 | Discharge: 2016-10-27 | Disposition: A | Discharge status: Home / Self Care | Payer: BLUE CROSS/BLUE SHIELD | Attending: Emergency Medicine | Admitting: Emergency Medicine

## 2016-10-27 DIAGNOSIS — E119 Type 2 diabetes mellitus without complications: Secondary | ICD-10-CM | POA: Diagnosis not present

## 2016-10-27 DIAGNOSIS — R112 Nausea with vomiting, unspecified: Secondary | ICD-10-CM | POA: Insufficient documentation

## 2016-10-27 DIAGNOSIS — Z7982 Long term (current) use of aspirin: Secondary | ICD-10-CM | POA: Insufficient documentation

## 2016-10-27 DIAGNOSIS — F329 Major depressive disorder, single episode, unspecified: Secondary | ICD-10-CM | POA: Diagnosis not present

## 2016-10-27 DIAGNOSIS — Z79899 Other long term (current) drug therapy: Secondary | ICD-10-CM | POA: Insufficient documentation

## 2016-10-27 DIAGNOSIS — I10 Essential (primary) hypertension: Secondary | ICD-10-CM | POA: Diagnosis not present

## 2016-10-27 DIAGNOSIS — R05 Cough: Secondary | ICD-10-CM | POA: Insufficient documentation

## 2016-10-27 DIAGNOSIS — Z9049 Acquired absence of other specified parts of digestive tract: Secondary | ICD-10-CM | POA: Insufficient documentation

## 2016-10-27 DIAGNOSIS — E876 Hypokalemia: Secondary | ICD-10-CM | POA: Diagnosis not present

## 2016-10-27 DIAGNOSIS — R55 Syncope and collapse: Secondary | ICD-10-CM

## 2016-10-27 LAB — BASIC METABOLIC PANEL
Anion gap: 9 (ref 5–15)
BUN: 9 mg/dL (ref 6–20)
CHLORIDE: 108 mmol/L (ref 101–111)
CO2: 23 mmol/L (ref 22–32)
Calcium: 8.7 mg/dL — ABNORMAL LOW (ref 8.9–10.3)
Creatinine, Ser: 0.74 mg/dL (ref 0.44–1.00)
GFR calc non Af Amer: >60 mL/min (ref >60)
Glucose, Bld: 96 mg/dL (ref 65–99)
POTASSIUM: 3.4 mmol/L — AB (ref 3.5–5.1)
Sodium: 140 mmol/L (ref 135–145)

## 2016-10-27 LAB — CBC
HEMATOCRIT: 38.7 % (ref 36.0–46.0)
HEMOGLOBIN: 12.5 g/dL (ref 12.0–15.0)
MCH: 25.8 pg — AB (ref 26.0–34.0)
MCHC: 32.3 g/dL (ref 30.0–36.0)
MCV: 80 fL (ref 78.0–100.0)
Platelets: 263 10*3/uL (ref 150–400)
RBC: 4.84 MIL/uL (ref 3.87–5.11)
RDW: 13.9 % (ref 11.5–15.5)
WBC: 10 10*3/uL (ref 4.0–10.5)

## 2016-10-27 MED ORDER — POTASSIUM CHLORIDE CRYS ER 20 MEQ PO TBCR
40.0000 meq | EXTENDED_RELEASE_TABLET | Freq: Once | ORAL | Status: AC
  Administered 2016-10-27: 40 meq via ORAL
  Filled 2016-10-27: qty 2

## 2016-10-27 MED ORDER — SODIUM CHLORIDE 0.9 % IV BOLUS (SEPSIS)
1000.0000 mL | Freq: Once | INTRAVENOUS | Status: AC
  Administered 2016-10-27: 1000 mL via INTRAVENOUS

## 2016-10-27 NOTE — ED Notes (Signed)
Pt provided with another cup of ice water.

## 2016-10-27 NOTE — ED Triage Notes (Addendum)
Pt arrived via EMS after having a syncopal episode in church.  EMS states pt was confused and disoriented. Pt denies having seizure disorder. Pt has hx of htn. Pt is now alert and oriented to place, location, name, and DOB. Pt denies hitting head when she fell. VSS.

## 2016-10-27 NOTE — ED Provider Notes (Signed)
[facility] DEPT Provider Note   CSN: [id number] Arrival date & time: 10/27/16  1302     History   Chief Complaint Chief Complaint  Patient presents with  . Loss of Consciousness    HPI Ashley Mcknight is a 66 y.o. female.  HPI She complains of cough productive of yellow sputum for slightly longer than 1 week. And some episodes of posttussive vomiting. She had subjective fever 3 days ago. No treatment prior to coming here. She felt poorly when on her way to church this morning. When she stood up in church she had a syncopal event which was unwitnessed by her husband who accompanies her. Associated symptoms include lightheaded with standing. Denies any hemoptysis. Presently feels thirsty. Other associated symptoms include diminished appetite for the past few days. Denies abdominal pain. She does complain of chest pain and left anterior chest for one week covering a 2 cm diameter area which is worse with coughing and not improved by anything. Past Medical History:  Diagnosis Date  . Depression   . Diabetes mellitus without complication (HCC)   . Fatigue   . Gout   . Hyperlipidemia   . Hypertension     Patient Active Problem List   Diagnosis Date Noted  . Hypertension 09/15/2014  . Sleep related leg cramps 09/15/2014  . Impaired fasting glucose 09/15/2014  . Obesity 09/15/2014    Past Surgical History:  Procedure Laterality Date  . ABDOMINAL HYSTERECTOMY    . APPENDECTOMY  2011  . CARPAL TUNNEL RELEASE Bilateral   . CHOLECYSTECTOMY    . KNEE SURGERY Left   . ROTATOR CUFF REPAIR    . toenail removal      OB History    No data available       Home Medications    Prior to Admission medications   Medication Sig Start Date End Date Taking? Authorizing Provider  aspirin 81 MG tablet Take 81 mg by mouth daily.    [provider]  hydrochlorothiazide (HYDRODIURIL) 25 MG tablet TAKE 1 TABLET (25 MG TOTAL) BY MOUTH DAILY. 08/07/16   Particia Mcknight, Ashley Elizabeth,  PA-C  lisinopril (PRINIVIL,ZESTRIL) 40 MG tablet TAKE 1 TABLET (40 MG TOTAL) BY MOUTH DAILY. 08/07/16   Particia Mcknight, Ashley Elizabeth, PA-C  meloxicam (MOBIC) 15 MG tablet TAKE 1 TABLET BY MOUTH EVERY DAY 06/21/16   Gabriel CirriWicker, Cheryl, NP  ranitidine (ZANTAC) 150 MG capsule Take 1 capsule (150 mg total) by mouth 2 (two) times daily. 03/17/15   Sharman CheekStafford, Phillip, MD  traMADol (ULTRAM) 50 MG tablet Take 50 mg by mouth as needed. 09/21/14   [provider]  triamcinolone cream (KENALOG) 0.1 % Apply 1 application topically 2 (two) times daily. 11/23/15   Particia Mcknight, Ashley Elizabeth, PA-C    Family History Family History  Problem Relation Age of Onset  . Hypertension Mother   . Cancer Father        throat  . Cancer Brother     Social History Social History  Substance Use Topics  . Smoking status: Never Smoker  . Smokeless tobacco: Never Used  . Alcohol use No     Allergies   Patient has no known allergies.   Review of Systems Review of Systems  Respiratory: Positive for cough.   Gastrointestinal: Positive for vomiting.       Posttussive vomiting  Allergic/Immunologic: Positive for immunocompromised state.  All other systems reviewed and are negative.    Physical Exam Updated Vital Signs BP (!) 172/84   Pulse 96  Temp 98.4 F (36.9 C) (Oral)   LMP  (LMP Unknown)   SpO2 99%   Physical Exam  Constitutional: She is oriented to person, place, and time. She appears well-developed and well-nourished. No distress.  HENT:  Head: Normocephalic and atraumatic.  Mucous membranes dry  Eyes: Pupils are equal, round, and reactive to light. Conjunctivae are normal.  Neck: Neck supple. No tracheal deviation present. No thyromegaly present.  Cardiovascular: Normal rate and regular rhythm.   No murmur heard. Pulmonary/Chest: Effort normal and breath sounds normal.  Abdominal: Soft. Bowel sounds are normal. She exhibits no distension. There is no tenderness.  Musculoskeletal: Normal range of  motion. She exhibits no edema or tenderness.  Neurological: She is alert and oriented to person, place, and time. Coordination normal.  Alert appropriate Glasgow Coma Score 15  Skin: Skin is warm and dry. No rash noted.  Psychiatric: She has a normal mood and affect.  Nursing note and vitals reviewed.    ED Treatments / Results  Labs (all labs ordered are listed, but only abnormal results are displayed) Labs Reviewed  BASIC METABOLIC PANEL  CBC    EKG  EKG Interpretation None       Date: 10/27/2016  Rate: 90  Rhythm: normal sinus rhythm  QRS Axis: normal  Intervals: normal  ST/T Wave abnormalities: normal  Conduction Disutrbances: none  Narrative Interpretation: unremarkable    Radiology No results found. Chest x-ray viewed by me Results for orders placed or performed during the hospital encounter of 10/27/16  Basic metabolic panel  Result Value Ref Range   Sodium 140 135 - 145 mmol/L   Potassium 3.4 (L) 3.5 - 5.1 mmol/L   Chloride 108 101 - 111 mmol/L   CO2 23 22 - 32 mmol/L   Glucose, Bld 96 65 - 99 mg/dL   BUN 9 6 - 20 mg/dL   Creatinine, Ser 1.61 0.44 - 1.00 mg/dL   Calcium 8.7 (L) 8.9 - 10.3 mg/dL   GFR calc non Af Amer >60 >60 mL/min   GFR calc Af Amer >60 >60 mL/min   Anion gap 9 5 - 15  CBC  Result Value Ref Range   WBC 10.0 4.0 - 10.5 K/uL   RBC 4.84 3.87 - 5.11 MIL/uL   Hemoglobin 12.5 12.0 - 15.0 g/dL   HCT 09.6 04.5 - 40.9 %   MCV 80.0 78.0 - 100.0 fL   MCH 25.8 (L) 26.0 - 34.0 pg   MCHC 32.3 30.0 - 36.0 g/dL   RDW 81.1 91.4 - 78.2 %   Platelets 263 150 - 400 K/uL   Dg Chest 2 View  Result Date: 10/27/2016 CLINICAL DATA:  Syncopal episode at church. Confusion. Disorientation. EXAM: CHEST  2 VIEW COMPARISON:  November 25, 2011 FINDINGS: A compression fracture of a lower thoracic vertebral body is unchanged since 2012. The heart, hila, mediastinum, lungs, and pleura are otherwise normal. IMPRESSION: No active cardiopulmonary disease.  Electronically Signed   By: Gerome Weylin Plagge III M.D   On: 10/27/2016 15:20   Procedures Procedures (including critical care time)  Medications Ordered in ED Medications  sodium chloride 0.9 % bolus 1,000 mL (not administered)   5:05 PM patient resting comfortably. Appears in no distress and reports feeling well. Patient was able here without vomiting Initial Impression / Assessment and Plan / ED Course  I have reviewed the triage vital signs and the nursing notes.  Pertinent labs & imaging results that were available during my care of the patient  were reviewed by me and considered in my medical decision making (see chart for details).     Syncope likely secondary to dehydration as patient reported feeling lightheaded when standing. She received oral potassium supplementation while here as well as saline bolus.. She is encouraged to hydrate orally. Cough likely secondary to viral illness.Pt signed out to Dr. Clarene Duke 530 pm who will re-evalaute  Once iv fluids are infused Final Clinical Impressions(s) / ED Diagnoses  Diagnosis #1 syncope #2 cough #3 nausea and vomiting 4 hypokalemia Final diagnoses:  None    New Prescriptions New Prescriptions   No medications on file     Doug Sou, MD 10/27/16 1737

## 2016-10-27 NOTE — ED Notes (Signed)
Pt ambulated approx 100 feet. Pt had slight sway while walking. EDP notified.

## 2016-10-27 NOTE — Discharge Instructions (Signed)
Make sure that you drink at least six 8 ounce glasses of water or Gatorade each day in order to stay well-hydrated. See your primary care physician if you continue to fill about the 2-3 days. Return if you feel worse for any reason

## 2016-10-27 NOTE — ED Notes (Signed)
ED Provider at bedside. 

## 2016-10-27 NOTE — ED Provider Notes (Signed)
I received pt in signout from Dr. Felix PaciniJacobuwitz. Workup was reassuring, we were awaiting completion of IV fluid boluses followed by ambulation. Patient received 2 L and then ambulated in the hallway. She reported feeling mildly unsteady but overall was improved since arrival. She has been able to eat and drink here without problems. She is well-appearing with reassuring vital signs on reexamination. She feels comfortable going home based on her improvement. I discussed supportive measures and emphasized the importance of good hydration. Instructed to follow-up with PCP this week. Extensively reviewed return precautions including repeat episode of syncope, chest pain, shortness breath, or any other sudden onset of symptoms. She voiced understanding and was discharged in satisfactory condition.   Rana Hochstein, Ambrose Finlandachel Morgan, MD 10/27/16 [phone]

## 2016-11-04 ENCOUNTER — Telehealth: Payer: Self-pay

## 2016-11-04 NOTE — Telephone Encounter (Signed)
Received a fax from the [facility] stating that the patient called and needs a refill on her methotrexate. I tried calling the patient because I do not see methotrexate in current or historical medication lists. Patient did not answer and no VM was set up. Will try to call again later.

## 2016-11-04 NOTE — Telephone Encounter (Signed)
Patient called back stating she was mistaken about the methotrexate being prescribed by [facility]. She states it was actually prescribed by Dr Ernest Pine at [facility].  She said you do not need to call her back.  Thanks  Just FYI.Marland KitchenMarland KitchenMarland Kitchen

## 2016-11-30 ENCOUNTER — Other Ambulatory Visit: Payer: Self-pay | Admitting: Unknown Physician Specialty

## 2017-01-23 ENCOUNTER — Ambulatory Visit
Admission: RE | Admit: 2017-01-23 | Discharge: 2017-01-23 | Disposition: A | Discharge status: Home / Self Care | Payer: BLUE CROSS/BLUE SHIELD | Source: Ambulatory Visit | Attending: Unknown Physician Specialty | Admitting: Unknown Physician Specialty

## 2017-01-23 DIAGNOSIS — Z1231 Encounter for screening mammogram for malignant neoplasm of breast: Secondary | ICD-10-CM | POA: Insufficient documentation

## 2017-02-08 ENCOUNTER — Other Ambulatory Visit: Payer: Self-pay | Admitting: Family Medicine

## 2017-02-14 ENCOUNTER — Encounter: Payer: Self-pay | Admitting: Family Medicine

## 2017-02-14 ENCOUNTER — Ambulatory Visit (INDEPENDENT_AMBULATORY_CARE_PROVIDER_SITE_OTHER): Payer: BLUE CROSS/BLUE SHIELD | Admitting: Family Medicine

## 2017-02-14 VITALS — BP 138/89 | HR 86 | Temp 98.7°F | Ht 64.2 in | Wt 216.4 lb

## 2017-02-14 DIAGNOSIS — M549 Dorsalgia, unspecified: Secondary | ICD-10-CM

## 2017-02-14 DIAGNOSIS — I10 Essential (primary) hypertension: Secondary | ICD-10-CM

## 2017-02-14 DIAGNOSIS — Z1211 Encounter for screening for malignant neoplasm of colon: Secondary | ICD-10-CM

## 2017-02-14 DIAGNOSIS — Z23 Encounter for immunization: Secondary | ICD-10-CM

## 2017-02-14 DIAGNOSIS — Z0000 Encounter for general adult medical examination without abnormal findings: Secondary | ICD-10-CM

## 2017-02-14 DIAGNOSIS — N644 Mastodynia: Secondary | ICD-10-CM

## 2017-02-14 DIAGNOSIS — R7301 Impaired fasting glucose: Secondary | ICD-10-CM

## 2017-02-14 NOTE — Progress Notes (Addendum)
BP 138/89 (BP Location: Left Arm, Patient Position: Sitting, Cuff Size: Large)   Pulse 86   Temp 98.7 F (37.1 C)   Ht 5' 4.2" (1.631 m)   Wt 216 lb 6 oz (98.1 kg)   LMP  (LMP Unknown)   SpO2 97%   BMI 36.91 kg/m    Subjective:    Patient ID: Ashley Mcknight, female    DOB: December 13, 1950, 66 y.o.   MRN: [id number]  HPI: Ashley Mcknight is a 66 y.o. female presenting on 02/14/2017 for comprehensive medical examination. Current medical complaints include:see below  Interested in cologuard, does not want colonoscopy.   Checks home BPs - getting low 130s/70s. Denies CP, SOB, dizziness, HAs. Compliant with medications without side effects.   BS checked recently right after a meal, was 140. Has not checked fasting in quite some time.   Did have some coffee with cream and sugar - will come back for labs early next week fasting.   Upper back pain and muscle tension for years, very interested in breast reduction surgery at this time to help relieve the weight of her breasts. Also having painful cyst in medial left breast that's been ongoing, recent imaging WNL with fibrocystic changes in left breast. Has not been doing anything OTC for sxs.   Menopausal Symptoms: no  Depression Screen done today and results listed below:  Depression screen [facility] 2/9 02/14/2017 04/28/2015  Decreased Interest 0 0  Down, Depressed, Hopeless 0 0  PHQ - 2 Score 0 0  Altered sleeping 0 -  Tired, decreased energy 0 -  Change in appetite 0 -  Feeling bad or failure about yourself  0 -  Trouble concentrating 0 -  Moving slowly or fidgety/restless 0 -  Suicidal thoughts 0 -  PHQ-9 Score 0 -  Difficult doing work/chores Not difficult at all -    The patient does not have a history of falls. I did not complete a risk assessment for falls. A plan of care for falls was not documented.   Past Medical History:  Past Medical History:  Diagnosis Date  . Depression   . Diabetes mellitus without complication  (HCC)   . Fatigue   . Gout   . Hyperlipidemia   . Hypertension     Surgical History:  Past Surgical History:  Procedure Laterality Date  . ABDOMINAL HYSTERECTOMY    . APPENDECTOMY  2011  . CARPAL TUNNEL RELEASE Bilateral   . CHOLECYSTECTOMY    . KNEE SURGERY Left   . ROTATOR CUFF REPAIR    . toenail removal      Medications:  Current Outpatient Medications on File Prior to Visit  Medication Sig  . aspirin 81 MG tablet Take 81 mg by mouth daily.  . hydrochlorothiazide (HYDRODIURIL) 25 MG tablet TAKE 1 TABLET (25 MG TOTAL) BY MOUTH DAILY.  Marland Kitchen lisinopril (PRINIVIL,ZESTRIL) 40 MG tablet TAKE 1 TABLET (40 MG TOTAL) BY MOUTH DAILY.  . meloxicam (MOBIC) 15 MG tablet TAKE 1 TABLET BY MOUTH EVERY DAY  . traMADol (ULTRAM) 50 MG tablet Take 50 mg by mouth as needed.  . triamcinolone cream (KENALOG) 0.1 % Apply 1 application topically 2 (two) times daily.   No current facility-administered medications on file prior to visit.     Allergies:  No Known Allergies  Social History:  Social History   Socioeconomic History  . Marital status: Married    Spouse name: Not on file  . Number of children: Not on file  .  Years of education: Not on file  . Highest education level: Not on file  Social Needs  . Financial resource strain: Not on file  . Food insecurity - worry: Not on file  . Food insecurity - inability: Not on file  . Transportation needs - medical: Not on file  . Transportation needs - non-medical: Not on file  Occupational History  . Not on file  Tobacco Use  . Smoking status: Never Smoker  . Smokeless tobacco: Never Used  Substance and Sexual Activity  . Alcohol use: No    Alcohol/week: 0.0 oz  . Drug use: No  . Sexual activity: Yes  Other Topics Concern  . Not on file  Social History Narrative  . Not on file   Social History   Tobacco Use  Smoking Status Never Smoker  Smokeless Tobacco Never Used   Social History   Substance and Sexual Activity  Alcohol  Use No  . Alcohol/week: 0.0 oz    Family History:  Family History  Problem Relation Age of Onset  . Hypertension Mother   . Cancer Father        throat  . Cancer Brother     Past medical history, surgical history, medications, allergies, family history and social history reviewed with patient today and changes made to appropriate areas of the chart.   Review of Systems - General ROS: negative Psychological ROS: negative Ophthalmic ROS: negative ENT ROS: negative Endocrine ROS: negative Breast ROS: positive for - painful cyst left breast Respiratory ROS: no cough, shortness of breath, or wheezing Cardiovascular ROS: no chest pain or dyspnea on exertion Gastrointestinal ROS: no abdominal pain, change in bowel habits, or black or bloody stools Genito-Urinary ROS: no dysuria, trouble voiding, or hematuria All other ROS negative except what is listed above and in the HPI.      Objective:    BP 138/89 (BP Location: Left Arm, Patient Position: Sitting, Cuff Size: Large)   Pulse 86   Temp 98.7 F (37.1 C)   Ht 5' 4.2" (1.631 m)   Wt 216 lb 6 oz (98.1 kg)   LMP  (LMP Unknown)   SpO2 97%   BMI 36.91 kg/m   Wt Readings from Last 3 Encounters:  02/14/17 216 lb 6 oz (98.1 kg)  08/07/16 215 lb (97.5 kg)  01/25/16 224 lb (101.6 kg)    Physical Exam  Constitutional: She is oriented to person, place, and time. She appears well-developed and well-nourished. No distress.  HENT:  Head: Atraumatic.  Right Ear: External ear normal.  Left Ear: External ear normal.  Nose: Nose normal.  Mouth/Throat: Oropharynx is clear and moist. No oropharyngeal exudate.  Eyes: Conjunctivae are normal. Pupils are equal, round, and reactive to light. No scleral icterus.  Neck: Normal range of motion. Neck supple. No thyromegaly present.  Cardiovascular: Normal rate, regular rhythm, normal heart sounds and intact distal pulses.  Pulmonary/Chest: Effort normal and breath sounds normal. No respiratory  distress. Right breast exhibits no mass, no skin change and no tenderness. Left breast exhibits mass (painful cyst left medial breast) and tenderness. Left breast exhibits no skin change.  Abdominal: Soft. Bowel sounds are normal. She exhibits no mass. There is no tenderness.  Musculoskeletal: Normal range of motion. She exhibits no edema or tenderness.  Lymphadenopathy:    She has no cervical adenopathy.    She has no axillary adenopathy.  Neurological: She is alert and oriented to person, place, and time. No cranial nerve deficit.  Skin:  Skin is warm and dry. No rash noted.  Psychiatric: She has a normal mood and affect. Her behavior is normal.  Nursing note and vitals reviewed.   Results for orders placed or performed during the hospital encounter of 10/27/16  Basic metabolic panel  Result Value Ref Range   Sodium 140 135 - 145 mmol/L   Potassium 3.4 (L) 3.5 - 5.1 mmol/L   Chloride 108 101 - 111 mmol/L   CO2 23 22 - 32 mmol/L   Glucose, Bld 96 65 - 99 mg/dL   BUN 9 6 - 20 mg/dL   Creatinine, Ser 9.560.74 0.44 - 1.00 mg/dL   Calcium 8.7 (L) 8.9 - 10.3 mg/dL   GFR calc non Af Amer >60 >60 mL/min   GFR calc Af Amer >60 >60 mL/min   Anion gap 9 5 - 15  CBC  Result Value Ref Range   WBC 10.0 4.0 - 10.5 K/uL   RBC 4.84 3.87 - 5.11 MIL/uL   Hemoglobin 12.5 12.0 - 15.0 g/dL   HCT 21.338.7 08.636.0 - 57.846.0 %   MCV 80.0 78.0 - 100.0 fL   MCH 25.8 (L) 26.0 - 34.0 pg   MCHC 32.3 30.0 - 36.0 g/dL   RDW 46.913.9 62.911.5 - 52.815.5 %   Platelets 263 150 - 400 K/uL      Assessment & Plan:   Problem List Items Addressed This Visit      Cardiovascular and Mediastinum   Hypertension    Under good control on current regimen, continue present medications. Discussed good lifestyle modifications for further control.       Relevant Orders   CBC with Differential/Platelet   Comprehensive metabolic panel   UA/M w/rflx Culture, Routine     Endocrine   Impaired fasting glucose - Primary    Will get labs early  next week while pt is fasting. Await results, recent glucose readings WNL. Lifestyle modifications reviewed with patient for prevention.       Relevant Orders   HgB A1c    Other Visit Diagnoses    Annual physical exam       Pt non-fasting today, will come in early next week for fasting labs. Orders placed. Await results   Relevant Orders   Lipid Panel w/o Chol/HDL Ratio   Immunization due       Relevant Orders   Flu Vaccine QUAD 6+ mos PF IM (Fluarix Quad PF) (Completed)   Screening for colon cancer       Will fax over order for cologuard testing   Relevant Orders   Cologuard   Upper back pain       Referral to plastic surgery placed for breast reduction consult.    Relevant Orders   Ambulatory referral to Plastic Surgery   Breast tenderness       Painful cyst medial left breast. Ice prn to area, continue to monitor. Recent mammogram WNL. Pt to call with any changes or worsening      Follow up plan: Return in about 6 months (around 08/14/2017) for BP, BS f/u.   LABORATORY TESTING:  - Pap smear: not applicable  IMMUNIZATIONS:   - Tdap: Tetanus vaccination status reviewed: last tetanus booster within 10 years. - Influenza: Up to date - Zostavax vaccine: Up to date   SCREENING: -Mammogram: Up to date  - Colonoscopy: Refused   PATIENT COUNSELING:   Advised to take 1 mg of folate supplement per day if capable of pregnancy.   Sexuality: Discussed sexually transmitted  diseases, partner selection, use of condoms, avoidance of unintended pregnancy  and contraceptive alternatives.   Advised to avoid cigarette smoking.  I discussed with the patient that most people either abstain from alcohol or drink within safe limits (<=14/week and <=4 drinks/occasion for males, <=7/weeks and <= 3 drinks/occasion for females) and that the risk for alcohol disorders and other health effects rises proportionally with the number of drinks per week and how often a drinker exceeds daily  limits.  Discussed cessation/primary prevention of drug use and availability of treatment for abuse.   Diet: Encouraged to adjust caloric intake to maintain  or achieve ideal body weight, to reduce intake of dietary saturated fat and total fat, to limit sodium intake by avoiding high sodium foods and not adding table salt, and to maintain adequate dietary potassium and calcium preferably from fresh fruits, vegetables, and low-fat dairy products.    stressed the importance of regular exercise  Injury prevention: Discussed safety belts, safety helmets, smoke detector, smoking near bedding or upholstery.   Dental health: Discussed importance of regular tooth brushing, flossing, and dental visits.    NEXT PREVENTATIVE PHYSICAL DUE IN 1 YEAR. Return in about 6 months (around 08/14/2017) for BP, BS f/u.

## 2017-02-14 NOTE — Patient Instructions (Addendum)

## 2017-02-14 NOTE — Assessment & Plan Note (Signed)
Under good control on current regimen, continue present medications. Discussed good lifestyle modifications for further control.

## 2017-02-14 NOTE — Assessment & Plan Note (Signed)
Will get labs early next week while pt is fasting. Await results, recent glucose readings WNL. Lifestyle modifications reviewed with patient for prevention.

## 2017-02-20 LAB — COLOGUARD: COLOGUARD: NEGATIVE

## 2017-02-21 ENCOUNTER — Telehealth: Payer: Self-pay | Admitting: Unknown Physician Specialty

## 2017-02-21 ENCOUNTER — Other Ambulatory Visit: Payer: BLUE CROSS/BLUE SHIELD

## 2017-02-21 DIAGNOSIS — Z0000 Encounter for general adult medical examination without abnormal findings: Secondary | ICD-10-CM

## 2017-02-21 DIAGNOSIS — I10 Essential (primary) hypertension: Secondary | ICD-10-CM

## 2017-02-21 DIAGNOSIS — R7301 Impaired fasting glucose: Secondary | ICD-10-CM

## 2017-02-21 NOTE — Telephone Encounter (Signed)
Copied from CRM [id number]. Topic: Quick Communication - See Telephone Encounter >> Feb 21, 2017 11:27 AM Guinevere FerrariMorris, Dorlisa Savino E, NT wrote: CRM for notification. See Telephone encounter for: Pt. Is calling in for a prescription refill for hydrochlorothiazide (HYDRODIURIL) 25 MG tablet and lisinopril (PRINIVIL,ZESTRIL) 40 MG tablet. Pt uses [id number] - [place], [place] - [address] Pt has already checked with the pharmacy.  02/21/17.

## 2017-02-22 LAB — CBC WITH DIFFERENTIAL/PLATELET
BASOS: 0 %
Basophils Absolute: 0 10*3/uL (ref 0.0–0.2)
EOS (ABSOLUTE): 0.3 10*3/uL (ref 0.0–0.4)
EOS: 5 %
HEMATOCRIT: 40.5 % (ref 34.0–46.6)
HEMOGLOBIN: 12.8 g/dL (ref 11.1–15.9)
IMMATURE GRANS (ABS): 0 10*3/uL (ref 0.0–0.1)
IMMATURE GRANULOCYTES: 0 %
LYMPHS: 43 %
Lymphocytes Absolute: 2.6 10*3/uL (ref 0.7–3.1)
MCH: 26.3 pg — AB (ref 26.6–33.0)
MCHC: 31.6 g/dL (ref 31.5–35.7)
MCV: 83 fL (ref 79–97)
Monocytes Absolute: 0.4 10*3/uL (ref 0.1–0.9)
Monocytes: 7 %
NEUTROS PCT: 45 %
Neutrophils Absolute: 2.7 10*3/uL (ref 1.4–7.0)
Platelets: 287 10*3/uL (ref 150–379)
RBC: 4.86 x10E6/uL (ref 3.77–5.28)
RDW: 14 % (ref 12.3–15.4)
WBC: 6.1 10*3/uL (ref 3.4–10.8)

## 2017-02-22 LAB — COMPREHENSIVE METABOLIC PANEL
ALK PHOS: 70 IU/L (ref 39–117)
ALT: 10 IU/L (ref 0–32)
AST: 13 IU/L (ref 0–40)
Albumin/Globulin Ratio: 1.5 (ref 1.2–2.2)
Albumin: 4.3 g/dL (ref 3.6–4.8)
BUN/Creatinine Ratio: 17 (ref 12–28)
BUN: 15 mg/dL (ref 8–27)
Bilirubin Total: 0.2 mg/dL (ref 0.0–1.2)
CALCIUM: 9.5 mg/dL (ref 8.7–10.3)
CO2: 31 mmol/L — AB (ref 20–29)
CREATININE: 0.88 mg/dL (ref 0.57–1.00)
Chloride: 101 mmol/L (ref 96–106)
GFR calc Af Amer: 80 mL/min/{1.73_m2} (ref >59)
GFR, EST NON AFRICAN AMERICAN: 70 mL/min/{1.73_m2} (ref >59)
Globulin, Total: 2.9 g/dL (ref 1.5–4.5)
Glucose: 89 mg/dL (ref 65–99)
POTASSIUM: 4.2 mmol/L (ref 3.5–5.2)
Sodium: 140 mmol/L (ref 134–144)
Total Protein: 7.2 g/dL (ref 6.0–8.5)

## 2017-02-22 LAB — LIPID PANEL W/O CHOL/HDL RATIO
Cholesterol, Total: 181 mg/dL (ref 100–199)
HDL: 76 mg/dL (ref >39)
LDL CALC: 88 mg/dL (ref 0–99)
Triglycerides: 85 mg/dL (ref 0–149)
VLDL CHOLESTEROL CAL: 17 mg/dL (ref 5–40)

## 2017-02-24 ENCOUNTER — Other Ambulatory Visit: Payer: Self-pay

## 2017-02-24 MED ORDER — HYDROCHLOROTHIAZIDE 25 MG PO TABS: ORAL_TABLET | ORAL | 1 refills | Status: DC

## 2017-02-24 MED ORDER — LISINOPRIL 40 MG PO TABS: ORAL_TABLET | ORAL | 1 refills | Status: DC

## 2017-02-26 ENCOUNTER — Other Ambulatory Visit: Payer: Self-pay | Admitting: Family Medicine

## 2017-02-26 MED ORDER — AMOXICILLIN-POT CLAVULANATE 875-125 MG PO TABS: 1.0000 | ORAL_TABLET | Freq: Two times a day (BID) | ORAL | 0 refills | Status: DC

## 2017-03-10 LAB — UA/M W/RFLX CULTURE, ROUTINE
BILIRUBIN UA: NEGATIVE
GLUCOSE, UA: NEGATIVE
Ketones, UA: NEGATIVE
NITRITE UA: NEGATIVE
PROTEIN UA: NEGATIVE
SPEC GRAV UA: 1.02 (ref 1.005–1.030)
UUROB: 0.2 mg/dL (ref 0.2–1.0)
pH, UA: 6 (ref 5.0–7.5)

## 2017-03-10 LAB — MICROSCOPIC EXAMINATION

## 2017-03-10 LAB — URINE CULTURE, REFLEX

## 2017-03-19 ENCOUNTER — Telehealth: Payer: Self-pay | Admitting: Unknown Physician Specialty

## 2017-03-19 NOTE — Telephone Encounter (Signed)
Looked in chart, negative result in health maintenance. Patient notified of result.

## 2017-03-19 NOTE — Telephone Encounter (Signed)
Copied from CRM [id number]. Topic: Quick Communication - See Telephone Encounter >> Mar 19, 2017  2:51 PM Floria RavelingStovall, Shana A wrote: CRM for notification. See Telephone encounter :  pt called in and would like someone to call back with her collogued results .  She rec'd a letter that they were in   03/19/17.

## 2017-03-24 ENCOUNTER — Telehealth: Payer: Self-pay | Admitting: Unknown Physician Specialty

## 2017-03-24 NOTE — Telephone Encounter (Signed)
Can we order the TB blood draw order?

## 2017-03-24 NOTE — Telephone Encounter (Signed)
Is pt. Due to get TB shot?

## 2017-03-24 NOTE — Telephone Encounter (Signed)
Pt says that she starts a new job and need a TB test to work. Pt would like to come in tomorrow morning if possible to receive injection.   Is this okay to schedule?   Please call pt to schedule.   Thanks.

## 2017-03-25 NOTE — Telephone Encounter (Signed)
Patient checking status, please advise. Call back [phone]

## 2017-03-25 NOTE — Telephone Encounter (Signed)
Called and spoke to patient. I explained to her that we do not do the ppd skin tests for tb here, we do a blood draw. Patient states that she she is going to call her job and see which they want her to do and then call us back to let us know.

## 2017-03-25 NOTE — Telephone Encounter (Signed)
Routing to provider  

## 2017-03-26 NOTE — Telephone Encounter (Signed)
Called to follow up with patient. She states that [facility].

## 2017-04-24 ENCOUNTER — Other Ambulatory Visit: Payer: Self-pay | Admitting: Unknown Physician Specialty

## 2017-06-03 ENCOUNTER — Telehealth: Payer: Self-pay

## 2017-06-27 ENCOUNTER — Ambulatory Visit: Payer: BLUE CROSS/BLUE SHIELD | Admitting: Podiatry

## 2017-07-27 ENCOUNTER — Other Ambulatory Visit: Payer: Self-pay | Admitting: Unknown Physician Specialty

## 2017-08-21 ENCOUNTER — Ambulatory Visit: Payer: Self-pay | Admitting: *Deleted

## 2017-08-21 NOTE — Telephone Encounter (Signed)
Pt reports  Symptoms of vomiting - worse after eating  -   Bloating sensation  With onset of symptoms for several weeks . She denies any diarrhea. She reports symptoms somewhat relieved by ginger ale and pepto bismol.She reports she has not taken any otc prilosec or Zantac yet.Patient has an appointment 11 June with Gabriel Cirriheryl Wicker made prior to this Triage Encounter. Pt was advised per protocall that she needed to be seen within 24 hours.Patient states she will be ok till Tuesday and will try otc meds .   Reason for Disposition . [1] MILD or MODERATE vomiting AND [2] present > 48 hours (2 days) (Exception: mild vomiting with associated diarrhea)  Answer Assessment - Initial Assessment Questions 1. VOMITING SEVERITY: "How many times have you vomited in the past 24 hours?"     - MILD:  1 - 2 times/day    - MODERATE: 3 - 5 times/day, decreased oral intake without significant weight loss or symptoms of dehydration    - SEVERE: 6 or more times/day, vomits everything or nearly everything, with significant weight loss, symptoms of dehydration       2   2. ONSET: "When did the vomiting begin?"        Couple of weeks  3. FLUIDS: "What fluids or food have you vomited up today?" "Have you been able to keep any fluids down?"         Keeps fluid down -  Vomiting solids  Back  4. ABDOMINAL PAIN: "Are your having any abdominal pain?" If yes : "How bad is it and what does it feel like?" (e.g., crampy, dull, intermittent, constant)         Sore  feeling of gas  belching a lot   5. DIARRHEA: "Is there any diarrhea?" If so, ask: "How many times today?"           No  6. CONTACTS: "Is there anyone else in the family with the same symptoms?"            No  7. CAUSE: "What do you think is causing your vomiting?"            Patient had similar symptoms in past over 1 year ago but not this bad   8. HYDRATION STATUS: "Any signs of dehydration?" (e.g., dry mouth [not only dry lips], too weak to stand) "When did you  last urinate?"       Weak  Sometimes has a dry mouth  9. OTHER SYMPTOMS: "Do you have any other symptoms?" (e.g., fever, headache, vertigo, vomiting blood or coffee grounds, recent head injury)       Nauseated has been taking  otc meds that 10. PREGNANCY: "Is there any chance you are pregnant?" "When was your last menstrual period?"       N/A  Protocols used: [facility]

## 2017-08-21 NOTE — Telephone Encounter (Signed)
Tried calling and unable to reach her

## 2017-08-26 ENCOUNTER — Ambulatory Visit: Payer: BLUE CROSS/BLUE SHIELD | Admitting: Unknown Physician Specialty

## 2017-08-28 ENCOUNTER — Other Ambulatory Visit: Payer: Self-pay

## 2017-08-28 NOTE — Telephone Encounter (Signed)
Needs follow-up

## 2017-08-28 NOTE — Telephone Encounter (Signed)
Last Visit 02/14/2017

## 2017-08-29 ENCOUNTER — Other Ambulatory Visit: Payer: Self-pay | Admitting: Family Medicine

## 2017-08-29 NOTE — Telephone Encounter (Signed)
Needs a follow up appointment.

## 2017-08-29 NOTE — Telephone Encounter (Signed)
Hydrochlorothiazide 25 mg and Lisinopril 40 mg refill requests  LOV 02/14/17 with Wicker    Was for F/U but 2 appts have been cancelled with no future appts scheduled.    [facility] [address] [facility] [place] - [id number]

## 2017-09-01 ENCOUNTER — Encounter: Payer: Self-pay | Admitting: Family Medicine

## 2017-09-01 NOTE — Telephone Encounter (Signed)
Routing to close, this message was a duplicate.

## 2017-09-01 NOTE — Telephone Encounter (Signed)
Called pt, no answer, no VM set up

## 2017-09-01 NOTE — Telephone Encounter (Signed)
Letter printed to mail. °

## 2017-09-02 ENCOUNTER — Other Ambulatory Visit: Payer: Self-pay | Admitting: Family Medicine

## 2017-09-04 ENCOUNTER — Other Ambulatory Visit: Payer: Self-pay | Admitting: Family Medicine

## 2017-09-04 NOTE — Telephone Encounter (Signed)
Interface refill request for Hydrodiuril 25 mg.  Po   LOV 01/2317 with  Roosvelt MaserRachel Lane [facility]   Last refill: 07/28/17   Pharm[facility]

## 2017-09-30 ENCOUNTER — Ambulatory Visit (INDEPENDENT_AMBULATORY_CARE_PROVIDER_SITE_OTHER): Payer: BLUE CROSS/BLUE SHIELD | Admitting: Unknown Physician Specialty

## 2017-09-30 ENCOUNTER — Encounter: Payer: Self-pay | Admitting: Unknown Physician Specialty

## 2017-09-30 ENCOUNTER — Other Ambulatory Visit: Payer: Self-pay

## 2017-09-30 VITALS — BP 123/83 | HR 76 | Temp 98.2°F | Ht 64.0 in | Wt 220.0 lb

## 2017-09-30 DIAGNOSIS — I10 Essential (primary) hypertension: Secondary | ICD-10-CM | POA: Diagnosis not present

## 2017-09-30 DIAGNOSIS — E785 Hyperlipidemia, unspecified: Secondary | ICD-10-CM

## 2017-09-30 DIAGNOSIS — R7301 Impaired fasting glucose: Secondary | ICD-10-CM

## 2017-09-30 LAB — BAYER DCA HB A1C WAIVED: HB A1C: 5.5 % (ref <7.0)

## 2017-09-30 MED ORDER — LISINOPRIL 40 MG PO TABS: ORAL_TABLET | ORAL | 1 refills | Status: DC

## 2017-09-30 MED ORDER — HYDROCHLOROTHIAZIDE 25 MG PO TABS: ORAL_TABLET | ORAL | 1 refills | Status: DC

## 2017-09-30 MED ORDER — ATORVASTATIN CALCIUM 10 MG PO TABS: 10.0000 mg | ORAL_TABLET | Freq: Every day | ORAL | 3 refills | Status: DC

## 2017-09-30 NOTE — Progress Notes (Signed)
BP 123/83   Pulse 76   Temp 98.2 F (36.8 C) (Oral)   Ht 5\' 4"  (1.626 m)   Wt 220 lb (99.8 kg)   LMP  (LMP Unknown)   SpO2 95%   BMI 37.76 kg/m    Subjective:    Patient ID: Ashley Mcknight, female    DOB: 1951-02-23, 67 y.o.   MRN: [id number]  HPI: Ashley Mcknight is a 67 y.o. female  Chief Complaint  Patient presents with  . Medication Refill    tramadol/   . Mobic    pt states she no longer needs this medication  . Diabetes    pt states she has not have a recent eye exam   Knee OA Takes Meloxicam on occasion.      Hypertension Using medications without difficulty Average home BPs   No problems or lightheadedness No chest pain with exertion or shortness of breath No Edema  The 10-year ASCVD risk score Denman George DC Jr., et al., 2013) is: 16.1%   Values used to calculate the score:     Age: 33 years     Sex: Female     Is Non-Hispanic African American: Yes     Diabetic: Yes     Tobacco smoker: No     Systolic Blood Pressure: 123 mmHg     Is BP treated: Yes     HDL Cholesterol: 76 mg/dL     Total Cholesterol: 181 mg/dL  Relevant past medical, surgical, family and social history reviewed and updated as indicated. Interim medical history since our last visit reviewed. Allergies and medications reviewed and updated.  Review of Systems  Per HPI unless specifically indicated above     Objective:    BP 123/83   Pulse 76   Temp 98.2 F (36.8 C) (Oral)   Ht 5\' 4"  (1.626 m)   Wt 220 lb (99.8 kg)   LMP  (LMP Unknown)   SpO2 95%   BMI 37.76 kg/m   Wt Readings from Last 3 Encounters:  09/30/17 220 lb (99.8 kg)  02/14/17 216 lb 6 oz (98.1 kg)  08/07/16 215 lb (97.5 kg)    Physical Exam  Constitutional: She is oriented to person, place, and time. She appears well-developed and well-nourished. No distress.  HENT:  Head: Normocephalic and atraumatic.  Eyes: Conjunctivae and lids are normal. Right eye exhibits no discharge. Left eye exhibits no discharge.  No scleral icterus.  Neck: Normal range of motion. Neck supple. No JVD present. Carotid bruit is not present.  Cardiovascular: Normal rate, regular rhythm and normal heart sounds.  Pulmonary/Chest: Effort normal and breath sounds normal.  Abdominal: Normal appearance. There is no splenomegaly or hepatomegaly.  Musculoskeletal: Normal range of motion.  Neurological: She is alert and oriented to person, place, and time.  Skin: Skin is warm, dry and intact. No rash noted. No pallor.  Psychiatric: She has a normal mood and affect. Her behavior is normal. Judgment and thought content normal.    Results for orders placed or performed in visit on 03/05/17  Cologuard  Result Value Ref Range   Cologuard Negative       Assessment & Plan:   Problem List Items Addressed This Visit      Unprioritized   Hyperlipemia    Discussed ASCVD risk.  Pt agrees to start Atorvastatin 10 mg for risk reduction.  Side effects discussed.        Relevant Medications   lisinopril (PRINIVIL,ZESTRIL) 40 MG tablet  hydrochlorothiazide (HYDRODIURIL) 25 MG tablet   atorvastatin (LIPITOR) 10 MG tablet   Hypertension    Stable, continue present medications.        Relevant Medications   lisinopril (PRINIVIL,ZESTRIL) 40 MG tablet   hydrochlorothiazide (HYDRODIURIL) 25 MG tablet   atorvastatin (LIPITOR) 10 MG tablet   Other Relevant Orders   Comprehensive metabolic panel   Impaired fasting glucose - Primary    Hgb A1C is 5.5%.  Encouraged to cut back on soft drinks      Relevant Orders   Bayer DCA Hb A1c Waived       Follow up plan: Return in about 3 months (around 12/31/2017) for physical with Roosvelt Maserachel Lane PA.

## 2017-09-30 NOTE — Assessment & Plan Note (Signed)
Hgb A1C is 5.5%.  Encouraged to cut back on soft drinks

## 2017-09-30 NOTE — Assessment & Plan Note (Signed)
Stable, continue present medications.   

## 2017-09-30 NOTE — Assessment & Plan Note (Signed)
Discussed ASCVD risk.  Pt agrees to start Atorvastatin 10 mg for risk reduction.  Side effects discussed.

## 2017-10-01 ENCOUNTER — Encounter: Payer: Self-pay | Admitting: Unknown Physician Specialty

## 2017-10-01 LAB — COMPREHENSIVE METABOLIC PANEL
A/G RATIO: 1.4 (ref 1.2–2.2)
ALT: 11 IU/L (ref 0–32)
AST: 11 IU/L (ref 0–40)
Albumin: 4.1 g/dL (ref 3.6–4.8)
Alkaline Phosphatase: 66 IU/L (ref 39–117)
BUN / CREAT RATIO: 13 (ref 12–28)
BUN: 13 mg/dL (ref 8–27)
Bilirubin Total: 0.3 mg/dL (ref 0.0–1.2)
CALCIUM: 9.4 mg/dL (ref 8.7–10.3)
CO2: 24 mmol/L (ref 20–29)
CREATININE: 0.97 mg/dL (ref 0.57–1.00)
Chloride: 100 mmol/L (ref 96–106)
GFR calc Af Amer: 71 mL/min/{1.73_m2} (ref >59)
GFR, EST NON AFRICAN AMERICAN: 61 mL/min/{1.73_m2} (ref >59)
GLOBULIN, TOTAL: 2.9 g/dL (ref 1.5–4.5)
Glucose: 90 mg/dL (ref 65–99)
Potassium: 4.9 mmol/L (ref 3.5–5.2)
SODIUM: 137 mmol/L (ref 134–144)
Total Protein: 7 g/dL (ref 6.0–8.5)

## 2017-11-26 NOTE — Telephone Encounter (Signed)
Opened in error

## 2017-12-15 ENCOUNTER — Other Ambulatory Visit: Payer: Self-pay | Admitting: Unknown Physician Specialty

## 2017-12-15 DIAGNOSIS — Z1231 Encounter for screening mammogram for malignant neoplasm of breast: Secondary | ICD-10-CM

## 2017-12-30 ENCOUNTER — Ambulatory Visit: Payer: BLUE CROSS/BLUE SHIELD | Attending: Unknown Physician Specialty

## 2018-01-13 ENCOUNTER — Encounter: Payer: Self-pay | Admitting: Family Medicine

## 2018-01-13 ENCOUNTER — Ambulatory Visit (INDEPENDENT_AMBULATORY_CARE_PROVIDER_SITE_OTHER): Payer: BLUE CROSS/BLUE SHIELD | Admitting: Family Medicine

## 2018-01-13 VITALS — BP 128/84 | HR 78 | Temp 98.9°F | Ht 63.25 in | Wt 222.3 lb

## 2018-01-13 DIAGNOSIS — I10 Essential (primary) hypertension: Secondary | ICD-10-CM

## 2018-01-13 DIAGNOSIS — Z6839 Body mass index (BMI) 39.0-39.9, adult: Secondary | ICD-10-CM

## 2018-01-13 DIAGNOSIS — Z0000 Encounter for general adult medical examination without abnormal findings: Secondary | ICD-10-CM

## 2018-01-13 DIAGNOSIS — Z13820 Encounter for screening for osteoporosis: Secondary | ICD-10-CM

## 2018-01-13 DIAGNOSIS — R7301 Impaired fasting glucose: Secondary | ICD-10-CM

## 2018-01-13 DIAGNOSIS — Z23 Encounter for immunization: Secondary | ICD-10-CM | POA: Diagnosis not present

## 2018-01-13 DIAGNOSIS — E785 Hyperlipidemia, unspecified: Secondary | ICD-10-CM

## 2018-01-13 LAB — UA/M W/RFLX CULTURE, ROUTINE
Bilirubin, UA: NEGATIVE
GLUCOSE, UA: NEGATIVE
KETONES UA: NEGATIVE
NITRITE UA: NEGATIVE
PROTEIN UA: NEGATIVE
Specific Gravity, UA: 1.02 (ref 1.005–1.030)
UUROB: 0.2 mg/dL (ref 0.2–1.0)
pH, UA: 6.5 (ref 5.0–7.5)

## 2018-01-13 LAB — MICROSCOPIC EXAMINATION: WBC UA: NONE SEEN /HPF (ref 0–5)

## 2018-01-13 NOTE — Progress Notes (Signed)
BP 128/84   Pulse 78   Temp 98.9 F (37.2 C) (Oral)   Ht 5' 3.25" (1.607 m)   Wt 222 lb 4.8 oz (100.8 kg)   LMP  (LMP Unknown)   SpO2 98%   BMI 39.07 kg/m    Subjective:    Patient ID: Ashley Mcknight, female    DOB: [birth year], 67 y.o.   MRN: [id number]  HPI: Ashley Mcknight is a 67 y.o. female presenting on 01/13/2018 for comprehensive medical examination. Current medical complaints include:see below  Not checking home BS or BPs. Feeling well overall, taking medications faithfully. DM is currently diet controlled. Trying to eat a healthy diet and stay active. Denies CP, SOB, HAs, claudication, myalgias.   She currently lives with: Menopausal Symptoms: no  Depression Screen done today and results listed below:  Depression screen [facility] 2/9 01/13/2018 02/14/2017 04/28/2015  Decreased Interest 0 0 0  Down, Depressed, Hopeless 0 0 0  PHQ - 2 Score 0 0 0  Altered sleeping 0 0 -  Tired, decreased energy 0 0 -  Change in appetite 0 0 -  Feeling bad or failure about yourself  0 0 -  Trouble concentrating 0 0 -  Moving slowly or fidgety/restless 0 0 -  Suicidal thoughts 0 0 -  PHQ-9 Score 0 0 -  Difficult doing work/chores - Not difficult at all -    The patient does not have a history of falls. I did not complete a risk assessment for falls. A plan of care for falls was not documented.   Past Medical History:  Past Medical History:  Diagnosis Date  . Depression   . Diabetes mellitus without complication (HCC)   . Fatigue   . Gout   . Hyperlipidemia   . Hypertension     Surgical History:  Past Surgical History:  Procedure Laterality Date  . ABDOMINAL HYSTERECTOMY    . APPENDECTOMY  2011  . CARPAL TUNNEL RELEASE Bilateral   . CHOLECYSTECTOMY    . KNEE SURGERY Left   . ROTATOR CUFF REPAIR    . toenail removal      Medications:  Current Outpatient Medications on File Prior to Visit  Medication Sig  . aspirin 81 MG tablet Take 81 mg by mouth daily.  Marland Kitchen  atorvastatin (LIPITOR) 10 MG tablet Take 1 tablet (10 mg total) by mouth daily.  . hydrochlorothiazide (HYDRODIURIL) 25 MG tablet TAKE 1 TABLET BY MOUTH EVERY DAY  . lisinopril (PRINIVIL,ZESTRIL) 40 MG tablet TAKE 1 TABLET BY MOUTH EVERY DAY  . meloxicam (MOBIC) 15 MG tablet TAKE 1 TABLET BY MOUTH EVERY DAY  . triamcinolone cream (KENALOG) 0.1 % Apply 1 application topically 2 (two) times daily.   No current facility-administered medications on file prior to visit.     Allergies:  No Known Allergies  Social History:  Social History   Socioeconomic History  . Marital status: Married    Spouse name: Not on file  . Number of children: Not on file  . Years of education: Not on file  . Highest education level: Not on file  Occupational History  . Not on file  Social Needs  . Financial resource strain: Not on file  . Food insecurity:    Worry: Not on file    Inability: Not on file  . Transportation needs:    Medical: Not on file    Non-medical: Not on file  Tobacco Use  . Smoking status: Never Smoker  . Smokeless  tobacco: Never Used  Substance and Sexual Activity  . Alcohol use: No    Alcohol/week: 0.0 standard drinks  . Drug use: No  . Sexual activity: Yes  Lifestyle  . Physical activity:    Days per week: Not on file    Minutes per session: Not on file  . Stress: Not on file  Relationships  . Social connections:    Talks on phone: Not on file    Gets together: Not on file    Attends religious service: Not on file    Active member of club or organization: Not on file    Attends meetings of clubs or organizations: Not on file    Relationship status: Not on file  . Intimate partner violence:    Fear of current or ex partner: Not on file    Emotionally abused: Not on file    Physically abused: Not on file    Forced sexual activity: Not on file  Other Topics Concern  . Not on file  Social History Narrative  . Not on file   Social History   Tobacco Use  Smoking  Status Never Smoker  Smokeless Tobacco Never Used   Social History   Substance and Sexual Activity  Alcohol Use No  . Alcohol/week: 0.0 standard drinks    Family History:  Family History  Problem Relation Age of Onset  . Hypertension Mother   . Cancer Father        throat  . Cancer Brother     Past medical history, surgical history, medications, allergies, family history and social history reviewed with patient today and changes made to appropriate areas of the chart.   Review of Systems - General ROS: negative Psychological ROS: negative Ophthalmic ROS: negative ENT ROS: negative Allergy and Immunology ROS: negative Hematological and Lymphatic ROS: negative Endocrine ROS: negative Breast ROS: negative for breast lumps Respiratory ROS: no cough, shortness of breath, or wheezing Cardiovascular ROS: no chest pain or dyspnea on exertion Gastrointestinal ROS: no abdominal pain, change in bowel habits, or black or bloody stools Genito-Urinary ROS: no dysuria, trouble voiding, or hematuria Musculoskeletal ROS: negative Neurological ROS: no TIA or stroke symptoms Dermatological ROS: negative All other ROS negative except what is listed above and in the HPI.      Objective:    BP 128/84   Pulse 78   Temp 98.9 F (37.2 C) (Oral)   Ht 5' 3.25" (1.607 m)   Wt 222 lb 4.8 oz (100.8 kg)   LMP  (LMP Unknown)   SpO2 98%   BMI 39.07 kg/m   Wt Readings from Last 3 Encounters:  01/13/18 222 lb 4.8 oz (100.8 kg)  09/30/17 220 lb (99.8 kg)  02/14/17 216 lb 6 oz (98.1 kg)    Physical Exam  Constitutional: She is oriented to person, place, and time. She appears well-developed and well-nourished. No distress.  HENT:  Head: Atraumatic.  Right Ear: External ear normal.  Left Ear: External ear normal.  Nose: Nose normal.  Mouth/Throat: Oropharynx is clear and moist. No oropharyngeal exudate.  Eyes: Pupils are equal, round, and reactive to light. Conjunctivae are normal. No  scleral icterus.  Neck: Normal range of motion. Neck supple. No thyromegaly present.  Cardiovascular: Normal rate, regular rhythm, normal heart sounds and intact distal pulses.  Pulmonary/Chest: Effort normal and breath sounds normal. No respiratory distress. Right breast exhibits no mass, no skin change and no tenderness. Left breast exhibits no mass, no skin change and no  tenderness.  Abdominal: Soft. Bowel sounds are normal. She exhibits no mass. There is no tenderness.  Musculoskeletal: Normal range of motion. She exhibits no edema or tenderness.  Lymphadenopathy:    She has no cervical adenopathy.  Neurological: She is alert and oriented to person, place, and time. No cranial nerve deficit.  Skin: Skin is warm and dry. No rash noted.  Psychiatric: She has a normal mood and affect. Her behavior is normal.  Nursing note and vitals reviewed.   Results for orders placed or performed in visit on 01/13/18  Microscopic Examination  Result Value Ref Range   WBC, UA None seen 0 - 5 /hpf   RBC, UA 0-2 0 - 2 /hpf   Epithelial Cells (non renal) 0-10 0 - 10 /hpf   Bacteria, UA Few (A) None seen/Few  CBC with Differential/Platelet  Result Value Ref Range   WBC 6.3 3.4 - 10.8 x10E3/uL   RBC 5.00 3.77 - 5.28 x10E6/uL   Hemoglobin 12.8 11.1 - 15.9 g/dL   Hematocrit 16.1 09.6 - 46.6 %   MCV 81 79 - 97 fL   MCH 25.6 (L) 26.6 - 33.0 pg   MCHC 31.7 31.5 - 35.7 g/dL   RDW 04.5 40.9 - 81.1 %   Platelets 274 150 - 450 x10E3/uL   Neutrophils 46 Not Estab. %   Lymphs 40 Not Estab. %   Monocytes 9 Not Estab. %   Eos 4 Not Estab. %   Basos 1 Not Estab. %   Neutrophils Absolute 2.9 1.4 - 7.0 x10E3/uL   Lymphocytes Absolute 2.5 0.7 - 3.1 x10E3/uL   Monocytes Absolute 0.6 0.1 - 0.9 x10E3/uL   EOS (ABSOLUTE) 0.3 0.0 - 0.4 x10E3/uL   Basophils Absolute 0.0 0.0 - 0.2 x10E3/uL   Immature Granulocytes 0 Not Estab. %   Immature Grans (Abs) 0.0 0.0 - 0.1 x10E3/uL  Comprehensive metabolic panel  Result  Value Ref Range   Glucose 101 (H) 65 - 99 mg/dL   BUN 14 8 - 27 mg/dL   Creatinine, Ser 9.14 (H) 0.57 - 1.00 mg/dL   GFR calc non Af Amer 55 (L) >59 mL/min/1.73   GFR calc Af Amer 63 >59 mL/min/1.73   BUN/Creatinine Ratio 13 12 - 28   Sodium 140 134 - 144 mmol/L   Potassium 4.5 3.5 - 5.2 mmol/L   Chloride 101 96 - 106 mmol/L   CO2 22 20 - 29 mmol/L   Calcium 9.7 8.7 - 10.3 mg/dL   Total Protein 7.4 6.0 - 8.5 g/dL   Albumin 4.4 3.6 - 4.8 g/dL   Globulin, Total 3.0 1.5 - 4.5 g/dL   Albumin/Globulin Ratio 1.5 1.2 - 2.2   Bilirubin Total 0.3 0.0 - 1.2 mg/dL   Alkaline Phosphatase 75 39 - 117 IU/L   AST 13 0 - 40 IU/L   ALT 11 0 - 32 IU/L  Lipid Panel w/o Chol/HDL Ratio  Result Value Ref Range   Cholesterol, Total 150 100 - 199 mg/dL   Triglycerides 90 0 - 149 mg/dL   HDL 67 >78 mg/dL   VLDL Cholesterol Cal 18 5 - 40 mg/dL   LDL Calculated 65 0 - 99 mg/dL  UA/M w/rflx Culture, Routine  Result Value Ref Range   Specific Gravity, UA 1.020 1.005 - 1.030   pH, UA 6.5 5.0 - 7.5   Color, UA Yellow Yellow   Appearance Ur Clear Clear   Leukocytes, UA 1+ (A) Negative   Protein, UA Negative Negative/Trace  Glucose, UA Negative Negative   Ketones, UA Negative Negative   RBC, UA 1+ (A) Negative   Bilirubin, UA Negative Negative   Urobilinogen, Ur 0.2 0.2 - 1.0 mg/dL   Nitrite, UA Negative Negative   Microscopic Examination See below:   HgB A1c  Result Value Ref Range   Hgb A1c MFr Bld 6.2 (H) 4.8 - 5.6 %   Est. average glucose Bld gHb Est-mCnc 131 mg/dL  TSH  Result Value Ref Range   TSH 1.170 0.450 - 4.500 uIU/mL      Assessment & Plan:   Problem List Items Addressed This Visit      Cardiovascular and Mediastinum   Hypertension    BPs stable and WNL,continue current regimen      Relevant Orders   CBC with Differential/Platelet (Completed)   Comprehensive metabolic panel (Completed)   UA/M w/rflx Culture, Routine (Completed)     Endocrine   Impaired fasting glucose      Recheck labs, adjust as needed. Continue lifestyle modifications      Relevant Orders   HgB A1c (Completed)     Other   Obesity    Pt mentions wanting to lose weight but not knowing how toward end of visit. Reviewed at length about healthy diet and exercise changes      Hyperlipemia    Recheck lipids, continue current regimen. Diet and exercise reviewed      Relevant Orders   Lipid Panel w/o Chol/HDL Ratio (Completed)    Other Visit Diagnoses    Need for influenza vaccination    -  Primary   Relevant Orders   Flu vaccine HIGH DOSE PF (Fluzone High dose) (Completed)   Annual physical exam       Relevant Orders   TSH (Completed)   Screening for osteoporosis       Relevant Orders   DG BONE DENSITY (DXA)       Follow up plan: Return in about 6 months (around 07/15/2018) for BP, chol f/u.   LABORATORY TESTING:  - Pap smear: not applicable  IMMUNIZATIONS:   - Tdap: Tetanus vaccination status reviewed: last tetanus booster within 10 years. - Influenza: Administered today - Pneumovax: Not applicable - Prevnar: Refused  SCREENING: -Mammogram: Ordered today  - Colonoscopy: Up to date  - Bone Density: Ordered today   PATIENT COUNSELING:   Advised to take 1 mg of folate supplement per day if capable of pregnancy.   Sexuality: Discussed sexually transmitted diseases, partner selection, use of condoms, avoidance of unintended pregnancy  and contraceptive alternatives.   Advised to avoid cigarette smoking.  I discussed with the patient that most people either abstain from alcohol or drink within safe limits (<=14/week and <=4 drinks/occasion for males, <=7/weeks and <= 3 drinks/occasion for females) and that the risk for alcohol disorders and other health effects rises proportionally with the number of drinks per week and how often a drinker exceeds daily limits.  Discussed cessation/primary prevention of drug use and availability of treatment for abuse.   Diet:  Encouraged to adjust caloric intake to maintain  or achieve ideal body weight, to reduce intake of dietary saturated fat and total fat, to limit sodium intake by avoiding high sodium foods and not adding table salt, and to maintain adequate dietary potassium and calcium preferably from fresh fruits, vegetables, and low-fat dairy products.    stressed the importance of regular exercise  Injury prevention: Discussed safety belts, safety helmets, smoke detector, smoking near bedding or upholstery.  Dental health: Discussed importance of regular tooth brushing, flossing, and dental visits.    NEXT PREVENTATIVE PHYSICAL DUE IN 1 YEAR. Return in about 6 months (around 07/15/2018) for BP, chol f/u.

## 2018-01-13 NOTE — Patient Instructions (Signed)

## 2018-01-14 LAB — CBC WITH DIFFERENTIAL/PLATELET
Basophils Absolute: 0 10*3/uL (ref 0.0–0.2)
Basos: 1 %
EOS (ABSOLUTE): 0.3 10*3/uL (ref 0.0–0.4)
EOS: 4 %
HEMATOCRIT: 40.4 % (ref 34.0–46.6)
Hemoglobin: 12.8 g/dL (ref 11.1–15.9)
IMMATURE GRANS (ABS): 0 10*3/uL (ref 0.0–0.1)
IMMATURE GRANULOCYTES: 0 %
LYMPHS: 40 %
Lymphocytes Absolute: 2.5 10*3/uL (ref 0.7–3.1)
MCH: 25.6 pg — ABNORMAL LOW (ref 26.6–33.0)
MCHC: 31.7 g/dL (ref 31.5–35.7)
MCV: 81 fL (ref 79–97)
MONOS ABS: 0.6 10*3/uL (ref 0.1–0.9)
Monocytes: 9 %
NEUTROS PCT: 46 %
Neutrophils Absolute: 2.9 10*3/uL (ref 1.4–7.0)
PLATELETS: 274 10*3/uL (ref 150–450)
RBC: 5 x10E6/uL (ref 3.77–5.28)
RDW: 13.4 % (ref 12.3–15.4)
WBC: 6.3 10*3/uL (ref 3.4–10.8)

## 2018-01-14 LAB — COMPREHENSIVE METABOLIC PANEL
A/G RATIO: 1.5 (ref 1.2–2.2)
ALBUMIN: 4.4 g/dL (ref 3.6–4.8)
ALT: 11 IU/L (ref 0–32)
AST: 13 IU/L (ref 0–40)
Alkaline Phosphatase: 75 IU/L (ref 39–117)
BILIRUBIN TOTAL: 0.3 mg/dL (ref 0.0–1.2)
BUN / CREAT RATIO: 13 (ref 12–28)
BUN: 14 mg/dL (ref 8–27)
CO2: 22 mmol/L (ref 20–29)
Calcium: 9.7 mg/dL (ref 8.7–10.3)
Chloride: 101 mmol/L (ref 96–106)
Creatinine, Ser: 1.07 mg/dL — ABNORMAL HIGH (ref 0.57–1.00)
GFR calc non Af Amer: 55 mL/min/{1.73_m2} — ABNORMAL LOW (ref >59)
GFR, EST AFRICAN AMERICAN: 63 mL/min/{1.73_m2} (ref >59)
Globulin, Total: 3 g/dL (ref 1.5–4.5)
Glucose: 101 mg/dL — ABNORMAL HIGH (ref 65–99)
POTASSIUM: 4.5 mmol/L (ref 3.5–5.2)
Sodium: 140 mmol/L (ref 134–144)
TOTAL PROTEIN: 7.4 g/dL (ref 6.0–8.5)

## 2018-01-14 LAB — LIPID PANEL W/O CHOL/HDL RATIO
Cholesterol, Total: 150 mg/dL (ref 100–199)
HDL: 67 mg/dL (ref >39)
LDL Calculated: 65 mg/dL (ref 0–99)
Triglycerides: 90 mg/dL (ref 0–149)
VLDL Cholesterol Cal: 18 mg/dL (ref 5–40)

## 2018-01-14 LAB — HEMOGLOBIN A1C
ESTIMATED AVERAGE GLUCOSE: 131 mg/dL
Hgb A1c MFr Bld: 6.2 % — ABNORMAL HIGH (ref 4.8–5.6)

## 2018-01-14 LAB — TSH: TSH: 1.17 u[IU]/mL (ref 0.450–4.500)

## 2018-01-15 NOTE — Assessment & Plan Note (Signed)
Recheck labs, adjust as needed. Continue lifestyle modifications

## 2018-01-15 NOTE — Assessment & Plan Note (Signed)
Pt mentions wanting to lose weight but not knowing how toward end of visit. Reviewed at length about healthy diet and exercise changes

## 2018-01-15 NOTE — Assessment & Plan Note (Signed)
BPs stable and WNL, continue current regimen 

## 2018-01-15 NOTE — Assessment & Plan Note (Signed)
Recheck lipids, continue current regimen. Diet and exercise reviewed

## 2018-01-16 ENCOUNTER — Encounter: Payer: Self-pay | Admitting: Family Medicine

## 2018-01-21 ENCOUNTER — Other Ambulatory Visit: Payer: Self-pay | Admitting: Unknown Physician Specialty

## 2018-01-21 NOTE — Telephone Encounter (Signed)
Requested Prescriptions  Pending Prescriptions Disp Refills  . meloxicam (MOBIC) 15 MG tablet [Pharmacy Med Name: MELOXICAM 15 MG TABLET] 30 tablet 2    Sig: TAKE 1 TABLET BY MOUTH EVERY DAY     Analgesics:  COX2 Inhibitors Failed - 01/21/2018  2:03 AM      Failed - Cr in normal range and within 360 days    Creatinine  Date Value Ref Range Status  11/15/2013 0.90 0.60 - 1.30 mg/dL Final   Creatinine, Ser  Date Value Ref Range Status  01/13/2018 1.07 (H) 0.57 - 1.00 mg/dL Final         Passed - HGB in normal range and within 360 days    Hemoglobin  Date Value Ref Range Status  01/13/2018 12.8 11.1 - 15.9 g/dL Final         Passed - Patient is not pregnant      Passed - Valid encounter within last 12 months    Recent Outpatient Visits          1 week ago Need for influenza vaccination   [facility] Particia Nearing, PA-C   3 months ago Impaired fasting glucose   [facility] Gabriel Cirri, NP   11 months ago Impaired fasting glucose   [facility], Salley Hews, [place]   1 year ago Essential hypertension   [facility] Particia Nearing, [place]   2 years ago Galactorrhea   [facility] [place], Salley Hews, [place]      Future Appointments            In 6 months Maurice March, Salley Hews, PA-C [facility]

## 2018-05-01 ENCOUNTER — Other Ambulatory Visit: Payer: Self-pay | Admitting: Unknown Physician Specialty

## 2018-06-27 ENCOUNTER — Other Ambulatory Visit: Payer: Self-pay | Admitting: Unknown Physician Specialty

## 2018-06-27 NOTE — Telephone Encounter (Signed)
Please advise on refill request

## 2018-07-21 ENCOUNTER — Ambulatory Visit: Payer: BLUE CROSS/BLUE SHIELD | Admitting: Family Medicine

## 2018-08-21 ENCOUNTER — Ambulatory Visit: Payer: BLUE CROSS/BLUE SHIELD | Admitting: Family Medicine

## 2018-08-30 ENCOUNTER — Other Ambulatory Visit: Payer: Self-pay | Admitting: Unknown Physician Specialty

## 2018-08-31 NOTE — Telephone Encounter (Signed)
Ok to send in #[phone]

## 2018-09-01 ENCOUNTER — Ambulatory Visit: Payer: BLUE CROSS/BLUE SHIELD | Admitting: Family Medicine

## 2018-10-01 ENCOUNTER — Other Ambulatory Visit: Payer: Self-pay | Admitting: Family Medicine

## 2018-10-01 NOTE — Telephone Encounter (Signed)
Forwarding medication refill to PCP for review. 

## 2018-10-02 ENCOUNTER — Other Ambulatory Visit: Payer: Self-pay | Admitting: Unknown Physician Specialty

## 2018-10-19 ENCOUNTER — Encounter: Payer: Self-pay | Admitting: Family Medicine

## 2018-10-19 ENCOUNTER — Other Ambulatory Visit: Payer: Self-pay

## 2018-10-19 ENCOUNTER — Ambulatory Visit (INDEPENDENT_AMBULATORY_CARE_PROVIDER_SITE_OTHER): Payer: BC Managed Care – PPO | Admitting: Family Medicine

## 2018-10-19 VITALS — BP 134/93 | Ht 64.0 in | Wt 212.0 lb

## 2018-10-19 DIAGNOSIS — M542 Cervicalgia: Secondary | ICD-10-CM

## 2018-10-19 MED ORDER — PREDNISONE 20 MG PO TABS: 40.0000 mg | ORAL_TABLET | Freq: Every day | ORAL | 0 refills | Status: DC

## 2018-10-19 MED ORDER — CYCLOBENZAPRINE HCL 5 MG PO TABS: 5.0000 mg | ORAL_TABLET | Freq: Every evening | ORAL | 0 refills | Status: DC | PRN

## 2018-10-19 NOTE — Progress Notes (Signed)
BP (!) 134/93   Ht 5\' 4"  (1.626 m)   Wt 212 lb (96.2 kg)   LMP  (LMP Unknown)   BMI 36.39 kg/m    Subjective:    Patient ID: Ashley Mcknight, female    DOB: Apr 25, 1950, 68 y.o.   MRN: [id number]  HPI: Ashley Mcknight is a 68 y.o. female  Chief Complaint  Patient presents with  . Neck Pain    Been helping daughter move. Pain shooting down left arm. Ongoing appx 2 weeks.    . This visit was completed via telephone due to the restrictions of the COVID-19 pandemic. All issues as above were discussed and addressed. Physical exam was done as above through visual confirmation on telephone. If it was felt that the patient should be evaluated in the office, they were directed there. The patient verbally consented to this visit. . Location of the patient: home . Location of the provider: home . Those involved with this call:  . Provider: Roosvelt Maserachel Lane, PA-C . CMA: Myrtha MantisKeri Bullock, CMA . Front Desk/Registration: Harriet PhoJoliza Johnson  . Time spent on call: 15 minutes on the phone discussing health concerns. 5 minutes total spent in review of patient's record and preparation of their chart. I verified patient identity using two factors (patient name and date of birth). Patient consents verbally to being seen via telemedicine visit today.   Nagging neck pain, left side radiating down arm for about 2 weeks now. Has been using heat, tylenol and aspirin with minimal relief. No hx of neck injuries, but has been helping daughter move recently and lifting lots of heavy objects and thinking that may be playing a part. Denies numbness, tingling, weakness, headaches.   Relevant past medical, surgical, family and social history reviewed and updated as indicated. Interim medical history since our last visit reviewed. Allergies and medications reviewed and updated.  Review of Systems  Per HPI unless specifically indicated above     Objective:    BP (!) 134/93   Ht 5\' 4"  (1.626 m)   Wt 212 lb (96.2 kg)    LMP  (LMP Unknown)   BMI 36.39 kg/m   Wt Readings from Last 3 Encounters:  10/19/18 212 lb (96.2 kg)  01/13/18 222 lb 4.8 oz (100.8 kg)  09/30/17 220 lb (99.8 kg)    Physical Exam  Unable to perform PE today given patient lack of access to video technology for today's visit.   Results for orders placed or performed in visit on 01/13/18  Microscopic Examination   URINE  Result Value Ref Range   WBC, UA None seen 0 - 5 /hpf   RBC, UA 0-2 0 - 2 /hpf   Epithelial Cells (non renal) 0-10 0 - 10 /hpf   Bacteria, UA Few (A) None seen/Few  CBC with Differential/Platelet  Result Value Ref Range   WBC 6.3 3.4 - 10.8 x10E3/uL   RBC 5.00 3.77 - 5.28 x10E6/uL   Hemoglobin 12.8 11.1 - 15.9 g/dL   Hematocrit 78.240.4 95.634.0 - 46.6 %   MCV 81 79 - 97 fL   MCH 25.6 (L) 26.6 - 33.0 pg   MCHC 31.7 31.5 - 35.7 g/dL   RDW 21.313.4 08.612.3 - 57.815.4 %   Platelets 274 150 - 450 x10E3/uL   Neutrophils 46 Not Estab. %   Lymphs 40 Not Estab. %   Monocytes 9 Not Estab. %   Eos 4 Not Estab. %   Basos 1 Not Estab. %   Neutrophils  Absolute 2.9 1.4 - 7.0 x10E3/uL   Lymphocytes Absolute 2.5 0.7 - 3.1 x10E3/uL   Monocytes Absolute 0.6 0.1 - 0.9 x10E3/uL   EOS (ABSOLUTE) 0.3 0.0 - 0.4 x10E3/uL   Basophils Absolute 0.0 0.0 - 0.2 x10E3/uL   Immature Granulocytes 0 Not Estab. %   Immature Grans (Abs) 0.0 0.0 - 0.1 x10E3/uL  Comprehensive metabolic panel  Result Value Ref Range   Glucose 101 (H) 65 - 99 mg/dL   BUN 14 8 - 27 mg/dL   Creatinine, Ser 1.07 (H) 0.57 - 1.00 mg/dL   GFR calc non Af Amer 55 (L) >59 mL/min/1.73   GFR calc Af Amer 63 >59 mL/min/1.73   BUN/Creatinine Ratio 13 12 - 28   Sodium 140 134 - 144 mmol/L   Potassium 4.5 3.5 - 5.2 mmol/L   Chloride 101 96 - 106 mmol/L   CO2 22 20 - 29 mmol/L   Calcium 9.7 8.7 - 10.3 mg/dL   Total Protein 7.4 6.0 - 8.5 g/dL   Albumin 4.4 3.6 - 4.8 g/dL   Globulin, Total 3.0 1.5 - 4.5 g/dL   Albumin/Globulin Ratio 1.5 1.2 - 2.2   Bilirubin Total 0.3 0.0 - 1.2  mg/dL   Alkaline Phosphatase 75 39 - 117 IU/L   AST 13 0 - 40 IU/L   ALT 11 0 - 32 IU/L  Lipid Panel w/o Chol/HDL Ratio  Result Value Ref Range   Cholesterol, Total 150 100 - 199 mg/dL   Triglycerides 90 0 - 149 mg/dL   HDL 67 >39 mg/dL   VLDL Cholesterol Cal 18 5 - 40 mg/dL   LDL Calculated 65 0 - 99 mg/dL  UA/M w/rflx Culture, Routine   Specimen: Urine   URINE  Result Value Ref Range   Specific Gravity, UA 1.020 1.005 - 1.030   pH, UA 6.5 5.0 - 7.5   Color, UA Yellow Yellow   Appearance Ur Clear Clear   Leukocytes, UA 1+ (A) Negative   Protein, UA Negative Negative/Trace   Glucose, UA Negative Negative   Ketones, UA Negative Negative   RBC, UA 1+ (A) Negative   Bilirubin, UA Negative Negative   Urobilinogen, Ur 0.2 0.2 - 1.0 mg/dL   Nitrite, UA Negative Negative   Microscopic Examination See below:   HgB A1c  Result Value Ref Range   Hgb A1c MFr Bld 6.2 (H) 4.8 - 5.6 %   Est. average glucose Bld gHb Est-mCnc 131 mg/dL  TSH  Result Value Ref Range   TSH 1.170 0.450 - 4.500 uIU/mL      Assessment & Plan:   Problem List Items Addressed This Visit    None    Visit Diagnoses    Neck pain    -  Primary   With radiculopathy to left arm. Tx with prednisone, flexeril, heat, stretches, massage. F/u if not improving, may need referral to PT or imaging       Follow up plan: Return in about 4 weeks (around 11/16/2018) for 6 month f/u.

## 2018-10-23 ENCOUNTER — Other Ambulatory Visit: Payer: Self-pay | Admitting: Family Medicine

## 2018-10-23 NOTE — Telephone Encounter (Signed)
Forwarding medication refill request to PCP for review. 

## 2018-11-07 ENCOUNTER — Other Ambulatory Visit: Payer: Self-pay | Admitting: Family Medicine

## 2018-11-07 NOTE — Telephone Encounter (Signed)
Requested medication (s) are due for refill today: Yes  Requested medication (s) are on the active medication list: Yes  Last refill:  10/19/18  Future visit scheduled: Yes  Notes to clinic:  See request    Requested Prescriptions  Pending Prescriptions Disp Refills   cyclobenzaprine (FLEXERIL) 5 MG tablet [Pharmacy Med Name: CYCLOBENZAPRINE 5 MG TABLET] 30 tablet 0    Sig: Take 1 tablet (5 mg total) by mouth at bedtime as needed for muscle spasms.     Not Delegated - Analgesics:  Muscle Relaxants Failed - 11/07/2018  9:22 AM      Failed - This refill cannot be delegated      Passed - Valid encounter within last 6 months    Recent Outpatient Visits          2 weeks ago Neck pain   [facility], [place], [place]   9 months ago Need for influenza vaccination   [facility] Volney American, [place]   1 year ago Impaired fasting glucose   [facility] Kathrine Haddock, NP   1 year ago Impaired fasting glucose   [facility], Lilia Argue, [place]   2 years ago Essential hypertension   [place], Lilia Argue, [place]      Future Appointments            In 1 week Orene Desanctis, Lilia Argue, [place], [place]

## 2018-11-16 ENCOUNTER — Ambulatory Visit (INDEPENDENT_AMBULATORY_CARE_PROVIDER_SITE_OTHER): Payer: BC Managed Care – PPO

## 2018-11-16 ENCOUNTER — Other Ambulatory Visit: Payer: Self-pay

## 2018-11-16 DIAGNOSIS — Z23 Encounter for immunization: Secondary | ICD-10-CM | POA: Diagnosis not present

## 2018-11-17 ENCOUNTER — Other Ambulatory Visit: Payer: Self-pay

## 2018-11-17 ENCOUNTER — Encounter: Payer: Self-pay | Admitting: Family Medicine

## 2018-11-17 ENCOUNTER — Ambulatory Visit: Payer: Self-pay

## 2018-11-17 ENCOUNTER — Ambulatory Visit (INDEPENDENT_AMBULATORY_CARE_PROVIDER_SITE_OTHER): Payer: BC Managed Care – PPO | Admitting: Family Medicine

## 2018-11-17 VITALS — BP 152/85 | HR 99 | Ht 64.0 in | Wt 212.0 lb

## 2018-11-17 DIAGNOSIS — R7301 Impaired fasting glucose: Secondary | ICD-10-CM | POA: Diagnosis not present

## 2018-11-17 DIAGNOSIS — E785 Hyperlipidemia, unspecified: Secondary | ICD-10-CM

## 2018-11-17 DIAGNOSIS — I10 Essential (primary) hypertension: Secondary | ICD-10-CM | POA: Diagnosis not present

## 2018-11-17 DIAGNOSIS — R21 Rash and other nonspecific skin eruption: Secondary | ICD-10-CM

## 2018-11-17 MED ORDER — KETOCONAZOLE 2 % EX CREA: 1.0000 "application " | TOPICAL_CREAM | Freq: Every day | CUTANEOUS | 0 refills | Status: DC

## 2018-11-17 NOTE — Progress Notes (Signed)
BP (!) 152/85   Pulse 99   Ht 5\' 4"  (1.626 m)   Wt 212 lb (96.2 kg)   LMP  (LMP Unknown)   BMI 36.39 kg/m    Subjective:    Patient ID: Ashley SpiresLinda Harris Mcknight, female    DOB: 03-04-1951, 68 y.o.   MRN: [id number]  HPI: Ashley Mcknight is a 68 y.o. female  Chief Complaint  Patient presents with  . Skin Lesion    Ongoing about 1 week. On abdomen. Was tender now slightly tender.   . IFG    6 month F/U  . Hypertension  . Obesity  . Hyperlipidemia  . Osteoporosis    . This visit was completed via WebEx due to the restrictions of the COVID-19 pandemic. All issues as above were discussed and addressed. Physical exam was done as above through visual confirmation on WebEx. If it was felt that the patient should be evaluated in the office, they were directed there. The patient verbally consented to this visit. . Location of the patient: home . Location of the provider: home . Those involved with this call:  . Provider: Roosvelt Maserachel Conor Filsaime, PA-C . CMA: Myrtha MantisKeri Bullock, CMA . Front Desk/Registration: Harriet PhoJoliza Johnson  . Time spent on call: 25 minutes with patient face to face via video conference. More than 50% of this time was spent in counseling and coordination of care. 5 minutes total spent in review of patient's record and preparation of their chart. I verified patient identity using two factors (patient name and date of birth). Patient consents verbally to being seen via telemedicine visit today.   Here today for 6 month f/u.   Only new concern is a skin issue on her abdomen that's been ongoing for about a week now. Itchy, slightly painful. No new products, foods, or other exposures. Not trying anything OTC on it at this time.   HTN - reading was high this morning because she's been rushing around and stressed. Usually 130s/80s when she checks at home. Taking her medicine faithfully without issue. Denies CP, SOB, HAs, dizziness.   IFG - diet controlled. Tries to stay active and watch what she  eats but does not follow a strict diet.   HLD - on lipitor, tolerating well without side effects.   Relevant past medical, surgical, family and social history reviewed and updated as indicated. Interim medical history since our last visit reviewed. Allergies and medications reviewed and updated.  Review of Systems  Per HPI unless specifically indicated above     Objective:    BP (!) 152/85   Pulse 99   Ht 5\' 4"  (1.626 m)   Wt 212 lb (96.2 kg)   LMP  (LMP Unknown)   BMI 36.39 kg/m   Wt Readings from Last 3 Encounters:  11/17/18 212 lb (96.2 kg)  10/19/18 212 lb (96.2 kg)  01/13/18 222 lb 4.8 oz (100.8 kg)    Physical Exam Vitals signs and nursing note reviewed.  Constitutional:      General: She is not in acute distress.    Appearance: Normal appearance.  HENT:     Head: Atraumatic.     Right Ear: External ear normal.     Left Ear: External ear normal.     Nose: Nose normal. No congestion.     Mouth/Throat:     Mouth: Mucous membranes are moist.     Pharynx: Oropharynx is clear. No posterior oropharyngeal erythema.  Eyes:     Extraocular Movements: Extraocular  movements intact.     Conjunctiva/sclera: Conjunctivae normal.  Neck:     Musculoskeletal: Normal range of motion.  Cardiovascular:     Comments: Unable to assess via virtual visit Pulmonary:     Effort: Pulmonary effort is normal. No respiratory distress.  Musculoskeletal: Normal range of motion.  Skin:    General: Skin is dry.     Findings: No erythema.  Neurological:     Mental Status: She is alert and oriented to person, place, and time.  Psychiatric:        Mood and Affect: Mood normal.        Thought Content: Thought content normal.        Judgment: Judgment normal.     Results for orders placed or performed in visit on 01/13/18  Microscopic Examination   URINE  Result Value Ref Range   WBC, UA None seen 0 - 5 /hpf   RBC, UA 0-2 0 - 2 /hpf   Epithelial Cells (non renal) 0-10 0 - 10 /hpf    Bacteria, UA Few (A) None seen/Few  CBC with Differential/Platelet  Result Value Ref Range   WBC 6.3 3.4 - 10.8 x10E3/uL   RBC 5.00 3.77 - 5.28 x10E6/uL   Hemoglobin 12.8 11.1 - 15.9 g/dL   Hematocrit 40.4 34.0 - 46.6 %   MCV 81 79 - 97 fL   MCH 25.6 (L) 26.6 - 33.0 pg   MCHC 31.7 31.5 - 35.7 g/dL   RDW 13.4 12.3 - 15.4 %   Platelets 274 150 - 450 x10E3/uL   Neutrophils 46 Not Estab. %   Lymphs 40 Not Estab. %   Monocytes 9 Not Estab. %   Eos 4 Not Estab. %   Basos 1 Not Estab. %   Neutrophils Absolute 2.9 1.4 - 7.0 x10E3/uL   Lymphocytes Absolute 2.5 0.7 - 3.1 x10E3/uL   Monocytes Absolute 0.6 0.1 - 0.9 x10E3/uL   EOS (ABSOLUTE) 0.3 0.0 - 0.4 x10E3/uL   Basophils Absolute 0.0 0.0 - 0.2 x10E3/uL   Immature Granulocytes 0 Not Estab. %   Immature Grans (Abs) 0.0 0.0 - 0.1 x10E3/uL  Comprehensive metabolic panel  Result Value Ref Range   Glucose 101 (H) 65 - 99 mg/dL   BUN 14 8 - 27 mg/dL   Creatinine, Ser 1.07 (H) 0.57 - 1.00 mg/dL   GFR calc non Af Amer 55 (L) >59 mL/min/1.73   GFR calc Af Amer 63 >59 mL/min/1.73   BUN/Creatinine Ratio 13 12 - 28   Sodium 140 134 - 144 mmol/L   Potassium 4.5 3.5 - 5.2 mmol/L   Chloride 101 96 - 106 mmol/L   CO2 22 20 - 29 mmol/L   Calcium 9.7 8.7 - 10.3 mg/dL   Total Protein 7.4 6.0 - 8.5 g/dL   Albumin 4.4 3.6 - 4.8 g/dL   Globulin, Total 3.0 1.5 - 4.5 g/dL   Albumin/Globulin Ratio 1.5 1.2 - 2.2   Bilirubin Total 0.3 0.0 - 1.2 mg/dL   Alkaline Phosphatase 75 39 - 117 IU/L   AST 13 0 - 40 IU/L   ALT 11 0 - 32 IU/L  Lipid Panel w/o Chol/HDL Ratio  Result Value Ref Range   Cholesterol, Total 150 100 - 199 mg/dL   Triglycerides 90 0 - 149 mg/dL   HDL 67 >39 mg/dL   VLDL Cholesterol Cal 18 5 - 40 mg/dL   LDL Calculated 65 0 - 99 mg/dL  UA/M w/rflx Culture, Routine   Specimen: Urine  URINE  Result Value Ref Range   Specific Gravity, UA 1.020 1.005 - 1.030   pH, UA 6.5 5.0 - 7.5   Color, UA Yellow Yellow   Appearance Ur Clear  Clear   Leukocytes, UA 1+ (A) Negative   Protein, UA Negative Negative/Trace   Glucose, UA Negative Negative   Ketones, UA Negative Negative   RBC, UA 1+ (A) Negative   Bilirubin, UA Negative Negative   Urobilinogen, Ur 0.2 0.2 - 1.0 mg/dL   Nitrite, UA Negative Negative   Microscopic Examination See below:   HgB A1c  Result Value Ref Range   Hgb A1c MFr Bld 6.2 (H) 4.8 - 5.6 %   Est. average glucose Bld gHb Est-mCnc 131 mg/dL  TSH  Result Value Ref Range   TSH 1.170 0.450 - 4.500 uIU/mL      Assessment & Plan:   Problem List Items Addressed This Visit      Cardiovascular and Mediastinum   Hypertension - Primary    Elevated today, but typically under good control per her accounts of her home readings. Continue to monitor, call if noticing elevated readings consistently. Continue current regimen      Relevant Orders   Comprehensive metabolic panel     Endocrine   Impaired fasting glucose    Diet controlled. Recheck A1C, continue working on lifestyle modifications. Adjust as needed      Relevant Orders   HgB A1c     Other   Hyperlipemia    Recheck lipids, adjust as needed. Continue current regimen      Relevant Orders   Lipid Panel w/o Chol/HDL Ratio    Other Visit Diagnoses    Rash       Appears to be tinea rash, ketoconazole cream sent. F/u if not improving       Follow up plan: Return in about 6 months (around 05/17/2019) for CPE.

## 2018-11-18 ENCOUNTER — Ambulatory Visit: Payer: Self-pay | Admitting: Family Medicine

## 2018-11-23 NOTE — Assessment & Plan Note (Signed)
Elevated today, but typically under good control per her accounts of her home readings. Continue to monitor, call if noticing elevated readings consistently. Continue current regimen

## 2018-11-23 NOTE — Assessment & Plan Note (Signed)
Recheck lipids, adjust as needed. Continue current regimen 

## 2018-11-23 NOTE — Assessment & Plan Note (Signed)
Diet controlled. Recheck A1C, continue working on lifestyle modifications. Adjust as needed

## 2018-12-02 ENCOUNTER — Other Ambulatory Visit: Payer: Self-pay | Admitting: Family Medicine

## 2018-12-31 ENCOUNTER — Other Ambulatory Visit: Payer: Self-pay | Admitting: Family Medicine

## 2018-12-31 NOTE — Telephone Encounter (Signed)
Forwarding medication refill request to PCP for review. 

## 2019-01-04 ENCOUNTER — Other Ambulatory Visit: Payer: Self-pay

## 2019-01-04 NOTE — Telephone Encounter (Signed)
Copied from [facility] [id number]. Topic: General - Other >> Jan 04, 2019  3:46 PM Mcneil, Ja-Kwan wrote: Reason for CRM: Pt stated she requested a 90 day prescription for each of the following medications: atorvastatin (LIPITOR) 10 MG tablet, hydrochlorothiazide (HYDRODIURIL) 25 MG tablet, lisinopril (ZESTRIL) 40 MG tablet, and meloxicam (MOBIC) 15 MG tablet.   Routing to provider. Last visit 11/17/18. Patient requesting 90 day supplies.

## 2019-01-05 MED ORDER — ATORVASTATIN CALCIUM 10 MG PO TABS: 10.0000 mg | ORAL_TABLET | Freq: Every day | ORAL | 1 refills | Status: DC

## 2019-01-05 MED ORDER — MELOXICAM 15 MG PO TABS: 15.0000 mg | ORAL_TABLET | Freq: Every day | ORAL | 1 refills | Status: DC | PRN

## 2019-01-05 MED ORDER — LISINOPRIL 40 MG PO TABS: 40.0000 mg | ORAL_TABLET | Freq: Every day | ORAL | 1 refills | Status: DC

## 2019-01-05 MED ORDER — HYDROCHLOROTHIAZIDE 25 MG PO TABS: 25.0000 mg | ORAL_TABLET | Freq: Every day | ORAL | 1 refills | Status: DC

## 2019-01-23 ENCOUNTER — Other Ambulatory Visit: Payer: Self-pay | Admitting: *Deleted

## 2019-01-23 DIAGNOSIS — Z20822 Contact with and (suspected) exposure to covid-19: Secondary | ICD-10-CM

## 2019-01-24 LAB — NOVEL CORONAVIRUS, NAA: SARS-CoV-2, NAA: NOT DETECTED

## 2019-06-08 ENCOUNTER — Ambulatory Visit: Payer: BC Managed Care – PPO | Admitting: Nurse Practitioner

## 2019-06-08 ENCOUNTER — Encounter: Payer: Self-pay | Admitting: Nurse Practitioner

## 2019-06-08 ENCOUNTER — Other Ambulatory Visit: Payer: Self-pay

## 2019-06-08 VITALS — BP 140/94 | HR 90 | Temp 98.6°F | Ht 64.0 in | Wt 220.4 lb

## 2019-06-08 DIAGNOSIS — M79604 Pain in right leg: Secondary | ICD-10-CM

## 2019-06-08 LAB — BAYER DCA HB A1C WAIVED: HB A1C (BAYER DCA - WAIVED): 6.1 % (ref <7.0)

## 2019-06-08 MED ORDER — GABAPENTIN 300 MG PO CAPS: 300.0000 mg | ORAL_CAPSULE | Freq: Every day | ORAL | 0 refills | Status: DC

## 2019-06-08 NOTE — Assessment & Plan Note (Signed)
Acute, ongoing.  Will obtain doppler of right lower extremity to rule out blood clot.  Likely nerve-related as pain related to tempterature changes.  Gabapentin restarted, A1c checked and vitamin B12 checked today.  Follow up in 4 weeks to monitor response to gabapentin.

## 2019-06-08 NOTE — Progress Notes (Signed)
BP (!) 140/94 (BP Location: Right Arm, Cuff Size: Normal)   Pulse 90   Temp 98.6 F (37 C) (Oral)   Ht 5\' 4"  (1.626 m)   Wt 220 lb 6.4 oz (100 kg)   LMP  (LMP Unknown)   SpO2 97%   BMI 37.83 kg/m    Subjective:    Patient ID: , female    DOB: 08-02-1950, 69 y.o.   MRN: [id number]  HPI: Ashley Mcknight is a 69 y.o. female presenting for pain in the right leg.    Chief Complaint  Patient presents with  . Leg Pain    Right leg pain. Sharp pain near calf muscle that radiates to back of leg.  71 Referral    Needs referral to verin specialist.   LEG PAIN Patient reports the leg pain is on the outside of her right calf and radiates to the back of her leg.  She reports it started as a tingling a few months ago.  She has tried voltaren.  She reports it only is painful when her leg is cold or when her foot is cold. Duration: months Pain: yes Severity: severe  Quality:  throbbing Location:  right lower leg Bilateral:  no Onset: sudden Frequency: intermittent Time of  day:   at random Sudden unintentional leg jerking:   no Paresthesias:   no Decreased sensation:  yes Weakness:   yes Insomnia:   no Fatigue:   yes Alleviating factors: heating pad, Ibuprofen, Tylenol, voltaren, alcohol rub Aggravating factors: walking on cold floors, cold sheets,  Status: worse Treatments attempted: heating pad, Ibuprofen, Tylenol, voltaren, alchol rub   No Known Allergies  Outpatient Encounter Medications as of 06/08/2019  Medication Sig Note  . aspirin 81 MG tablet Take 81 mg by mouth daily.   06/10/2019 atorvastatin (LIPITOR) 10 MG tablet Take 1 tablet (10 mg total) by mouth daily.   . cyclobenzaprine (FLEXERIL) 5 MG tablet TAKE 1 TABLET (5 MG TOTAL) BY MOUTH AT BEDTIME AS NEEDED FOR MUSCLE SPASMS. 11/17/2018: Only PRN  . gabapentin (NEURONTIN) 300 MG capsule Take 1 capsule (300 mg total) by mouth daily.   . hydrochlorothiazide (HYDRODIURIL) 25 MG tablet Take 1 tablet (25 mg total)  by mouth daily.   01/17/2019 ketoconazole (NIZORAL) 2 % cream Apply 1 application topically daily.   Marland Kitchen lisinopril (ZESTRIL) 40 MG tablet Take 1 tablet (40 mg total) by mouth daily.   . meloxicam (MOBIC) 15 MG tablet Take 1 tablet (15 mg total) by mouth daily as needed.   . [DISCONTINUED] gabapentin (NEURONTIN) 300 MG capsule Take 1 capsule by mouth daily.    No facility-administered encounter medications on file as of 06/08/2019.   Patient Active Problem List   Diagnosis Date Noted  . Right leg pain 06/08/2019  . Hyperlipemia 09/30/2017  . Hypertension 09/15/2014  . Sleep related leg cramps 09/15/2014  . Impaired fasting glucose 09/15/2014  . Obesity 09/15/2014   Past Medical History:  Diagnosis Date  . Depression   . Diabetes mellitus without complication (HCC)   . Fatigue   . Gout   . Hyperlipidemia   . Hypertension    Relevant past medical, surgical, family and social history reviewed and updated as indicated. Interim medical history since our last visit reviewed. Allergies and medications reviewed and updated.  Review of Systems  Constitutional: Negative.  Negative for activity change, appetite change, fatigue and fever.  Cardiovascular: Negative.  Negative for chest pain, palpitations and leg swelling.  Endocrine: Positive for cold intolerance.  Musculoskeletal: Positive for myalgias. Negative for arthralgias, back pain, gait problem and joint swelling.  Skin: Negative.  Negative for color change, pallor, rash and wound.  Neurological: Positive for weakness and numbness. Negative for dizziness, tremors, light-headedness and headaches.  Psychiatric/Behavioral: Negative.  Negative for confusion and sleep disturbance. The patient is not nervous/anxious.     Per HPI unless specifically indicated above     Objective:    BP (!) 140/94 (BP Location: Right Arm, Cuff Size: Normal)   Pulse 90   Temp 98.6 F (37 C) (Oral)   Ht 5\' 4"  (1.626 m)   Wt 220 lb 6.4 oz (100 kg)   LMP  (LMP  Unknown)   SpO2 97%   BMI 37.83 kg/m   Wt Readings from Last 3 Encounters:  06/08/19 220 lb 6.4 oz (100 kg)  11/17/18 212 lb (96.2 kg)  10/19/18 212 lb (96.2 kg)    Physical Exam Vitals and nursing note reviewed.  Constitutional:      General: She is not in acute distress.    Appearance: Normal appearance. She is not toxic-appearing.  Cardiovascular:     Pulses:          Dorsalis pedis pulses are 2+ on the right side and 2+ on the left side.       Posterior tibial pulses are 2+ on the right side and 2+ on the left side.  Musculoskeletal:        General: Tenderness present. No swelling. Normal range of motion.     Right lower leg: Tenderness present. No swelling. No edema.     Left lower leg: No swelling or tenderness. No edema.     Right foot: Normal.     Left foot: Normal.  Feet:     Right foot:     Skin integrity: Skin integrity normal.     Left foot:     Skin integrity: Skin integrity normal.  Skin:    General: Skin is warm and dry.     Capillary Refill: Capillary refill takes less than 2 seconds.     Coloration: Skin is not jaundiced or pale.     Findings: No bruising, erythema or lesion.  Neurological:     General: No focal deficit present.     Mental Status: She is alert and oriented to person, place, and time.     Motor: No weakness.     Gait: Gait normal.     Deep Tendon Reflexes: Reflexes normal.  Psychiatric:        Mood and Affect: Mood normal.        Behavior: Behavior normal.        Thought Content: Thought content normal.        Judgment: Judgment normal.        Assessment & Plan:   Problem List Items Addressed This Visit      Other   Right leg pain - Primary    Acute, ongoing.  Will obtain doppler of right lower extremity to rule out blood clot.  Likely nerve-related as pain related to tempterature changes.  Gabapentin restarted, A1c checked and vitamin B12 checked today.  Follow up in 4 weeks to monitor response to gabapentin.      Relevant  Medications   gabapentin (NEURONTIN) 300 MG capsule   Other Relevant Orders   Bayer DCA Hb A1c Waived   Vitamin B12   VAS [place] LOWER EXTREMITY VENOUS (DVT)  Follow up plan: Return in about 4 weeks (around 07/06/2019) for follow up of leg pain.

## 2019-06-09 LAB — VITAMIN B12: Vitamin B-12: 577 pg/mL (ref 232–1245)

## 2019-06-14 ENCOUNTER — Other Ambulatory Visit: Payer: Self-pay

## 2019-06-14 ENCOUNTER — Ambulatory Visit (INDEPENDENT_AMBULATORY_CARE_PROVIDER_SITE_OTHER): Payer: BC Managed Care – PPO

## 2019-06-14 DIAGNOSIS — M79604 Pain in right leg: Secondary | ICD-10-CM

## 2019-07-05 ENCOUNTER — Telehealth: Payer: Self-pay | Admitting: Family Medicine

## 2019-07-05 NOTE — Telephone Encounter (Signed)
Appt scheduled tomorrow with Corrie Dandy

## 2019-07-05 NOTE — Telephone Encounter (Signed)
Pt called stating that the gabapentin that she has been taking has stopped helping her right leg pain. She states that she went to the vein and vascular specialist and that they did not find any blood clots. Pt states that she is looking for some relief. Please advise.      [id number] - [place], [place] - [address]  [address] [place] [address]  Phone: [phone] Fax: [phone]  Not a 24 hour pharmacy; exact hours not known.

## 2019-07-05 NOTE — Telephone Encounter (Addendum)
Per initial encounter,"Pt called stating that the gabapentin that she has been taking has stopped helping her right leg pain. She states that she went to the vein and vascular specialist and that they did not find any blood clots. Pt states that she is looking for some relief. Please advise."; contacted pt to schedule appt; decision tree completed; pt offered and accepted appt with Aura Dials, [facility], 07/06/19 at 1445; she verbalized understanding; will route to office for notification.     [id number] - [place], [place] - [address]  [address] [place] [address]  Phone: [phone] Fax: [phone]  Not a 24 hour pharmacy; exact hours not known.

## 2019-07-05 NOTE — Telephone Encounter (Signed)
Noted  

## 2019-07-06 ENCOUNTER — Other Ambulatory Visit: Payer: Self-pay

## 2019-07-06 ENCOUNTER — Ambulatory Visit: Payer: BC Managed Care – PPO | Admitting: Nurse Practitioner

## 2019-07-06 ENCOUNTER — Encounter: Payer: Self-pay | Admitting: Nurse Practitioner

## 2019-07-06 ENCOUNTER — Ambulatory Visit (INDEPENDENT_AMBULATORY_CARE_PROVIDER_SITE_OTHER): Payer: BC Managed Care – PPO | Admitting: Nurse Practitioner

## 2019-07-06 ENCOUNTER — Ambulatory Visit
Admission: RE | Admit: 2019-07-06 | Discharge: 2019-07-06 | Disposition: A | Discharge status: Home / Self Care | Payer: BC Managed Care – PPO | Source: Ambulatory Visit | Attending: Nurse Practitioner | Admitting: Nurse Practitioner

## 2019-07-06 VITALS — BP 142/88 | HR 66 | Temp 98.5°F | Wt 220.0 lb

## 2019-07-06 DIAGNOSIS — M79604 Pain in right leg: Secondary | ICD-10-CM

## 2019-07-06 DIAGNOSIS — M545 Low back pain: Secondary | ICD-10-CM | POA: Insufficient documentation

## 2019-07-06 MED ORDER — GABAPENTIN 400 MG PO CAPS: 400.0000 mg | ORAL_CAPSULE | Freq: Every day | ORAL | 3 refills | Status: DC

## 2019-07-06 MED ORDER — PREDNISONE 10 MG PO TABS: ORAL_TABLET | ORAL | 0 refills | Status: DC

## 2019-07-06 NOTE — Progress Notes (Signed)
BP (!) 142/88   Pulse 66   Temp 98.5 F (36.9 C) (Oral)   Wt 220 lb (99.8 kg)   LMP  (LMP Unknown)   SpO2 98%   BMI 37.76 kg/m    Subjective:    Patient ID: Ashley Mcknight, female    DOB: [birth year], 69 y.o.   MRN: [id number]  HPI: Ashley Mcknight is a 69 y.o. female  Chief Complaint  Patient presents with  . Leg Pain    right leg. pt states the gabapentin is not helping    LEG PAIN (RIGHT) Reports ongoing leg pain to right side.  Was seen on 06/08/19 for similar issue and had imaging, no DVT noted.  Was restarted on Gabapentin 300 MG daily, she has taken this in past for sciatic pain in right leg.   Took one this morning, took last one this morning and has no further left.  Reports this is not helping, makes her a little tired.  Pain is located to anterior thigh.  At her work she lifts heavier set patients, home health aide.  Pain starts in her right hip and goes down to right anterior thigh.  Denies SOB, CP, edema, erythema, warmth to area.  Current CrCl 81.  Recent labs noted normal B12 and A1C in prediabetic range. Duration: weeks Pain: yes Severity: 8/10 at worst Quality:  shooting and throbbing Location: right thigh Bilateral:  no Onset: sudden Frequency: intermittent -- does not bother her at night Paresthesias:   no Decreased sensation:  no Weakness:   no Insomnia:   no Alleviating factors: Ibuprofen, Voltaren, Tylenol, Gabapentin Aggravating factors: moving quickly Status: fluctuating Treatments attempted: Gabapentin, Voltaren, Ibuprofen, Aspercreme, Voltaren, Tylenol   Relevant past medical, surgical, family and social history reviewed and updated as indicated. Interim medical history since our last visit reviewed. Allergies and medications reviewed and updated.  Review of Systems  Constitutional: Negative for activity change, appetite change, diaphoresis, fatigue and fever.  Respiratory: Negative for cough, chest tightness and shortness of breath.     Cardiovascular: Negative for chest pain, palpitations and leg swelling.  Musculoskeletal: Positive for arthralgias.  Neurological: Negative.   Psychiatric/Behavioral: Negative.     Per HPI unless specifically indicated above     Objective:    BP (!) 142/88   Pulse 66   Temp 98.5 F (36.9 C) (Oral)   Wt 220 lb (99.8 kg)   LMP  (LMP Unknown)   SpO2 98%   BMI 37.76 kg/m   Wt Readings from Last 3 Encounters:  07/06/19 220 lb (99.8 kg)  06/08/19 220 lb 6.4 oz (100 kg)  11/17/18 212 lb (96.2 kg)    Physical Exam Vitals and nursing note reviewed.  Constitutional:      General: She is awake. She is not in acute distress.    Appearance: She is well-developed. She is morbidly obese. She is not ill-appearing.  HENT:     Head: Normocephalic.     Right Ear: Hearing normal.     Left Ear: Hearing normal.  Eyes:     General: Lids are normal.        Right eye: No discharge.        Left eye: No discharge.     Conjunctiva/sclera: Conjunctivae normal.     Pupils: Pupils are equal, round, and reactive to light.  Neck:     Vascular: No carotid bruit.  Cardiovascular:     Rate and Rhythm: Normal rate and regular rhythm.  Heart sounds: Normal heart sounds. No murmur. No gallop.   Pulmonary:     Effort: Pulmonary effort is normal. No accessory muscle usage or respiratory distress.     Breath sounds: Normal breath sounds.  Abdominal:     General: Bowel sounds are normal.     Palpations: Abdomen is soft.  Musculoskeletal:     Cervical back: Normal range of motion and neck supple.     Lumbar back: Normal. No swelling, edema or tenderness. Normal range of motion. Negative right straight leg raise test and negative left straight leg raise test. No scoliosis.     Right hip: Tenderness present. No deformity, lacerations, bony tenderness or crepitus. Decreased range of motion. Normal strength.     Left hip: Normal.     Right lower leg: No edema.     Left lower leg: No edema.      Comments: Right hip decrease extension and flexion with mild tenderness on palpation to posterior aspect. Negative Homans bilaterally.  Skin:    General: Skin is warm and dry.  Neurological:     Mental Status: She is alert and oriented to person, place, and time.  Psychiatric:        Attention and Perception: Attention normal.        Mood and Affect: Mood normal.        Speech: Speech normal.        Behavior: Behavior normal. Behavior is cooperative.        Thought Content: Thought content normal.     Results for orders placed or performed in visit on 06/08/19  Bayer DCA Hb A1c Waived  Result Value Ref Range   HB A1C (BAYER DCA - WAIVED) 6.1 <7.0 %  Vitamin B12  Result Value Ref Range   Vitamin B-12 577 232 - 1,245 pg/mL      Assessment & Plan:   Problem List Items Addressed This Visit      Other   Right leg pain - Primary    Acute and ongoing, suspect coming from lower back or right hip.  Will obtain imaging for further evaluation.  Increase Gabapentin to 400 MG QHS and will trial a Prednisone taper, she is aware this may increase sugars and to monitor diet with this.  Scripts sent.  Recommend continued use of Tylenol and Voltaren gel as needed.  Discussed trying Icy/Hot Lidocaine patches and alternating heat/ice.  Will determine after imaging resulted next steps, discussed with her that she may benefit from PT, especially due to strenuous lifting at job.  May need assistance with posture and lifting techniques.  Return to office in 6 weeks OR sooner if worsening symptoms.      Relevant Orders   DG Hip Unilat W OR W/O Pelvis 2-3 Views Right   DG Lumbar Spine Complete       Follow up plan: Return in about 6 weeks (around 08/17/2019) for For right leg and hip pain with PCP.

## 2019-07-06 NOTE — Patient Instructions (Signed)
Piriformis Syndrome  Piriformis syndrome is a condition that can cause pain and numbness in your buttocks and down the back of your leg. Piriformis syndrome happens when the small muscle that connects the base of your spine to your hip (piriformis muscle) presses on the nerve that runs down the back of your leg (sciatic nerve). The piriformis muscle helps your hip rotate and helps to bring your leg back and out. It also helps shift your weight to keep you stable while you are walking. The sciatic nerve runs under or through the piriformis muscle. Damage to the piriformis muscle can cause spasms that put pressure on the nerve below. This causes pain and discomfort while sitting and moving. The pain may feel as if it begins in the buttock and spreads (radiates) down your hip and thigh. What are the causes? This condition is caused by pressure on the sciatic nerve from the piriformis muscle. The piriformis muscle can get irritated with overuse, especially if other hip muscles are weak and the piriformis muscle has to do extra work. Piriformis syndrome can also occur after an injury, like a fall onto your buttocks. What increases the risk? You are more likely to develop this condition if you:  Are a woman.  Sit for long periods of time.  Are a cyclist.  Have weak buttocks muscles (gluteal muscles). What are the signs or symptoms? Symptoms of this condition include:  Pain, tingling, or numbness that starts in the buttock and runs down the back of your leg (sciatica).  Pain in the groin or thigh area. Your symptoms may get worse:  The longer you sit.  When you walk, run, or climb stairs.  When straining to have a bowel movement. How is this diagnosed? This condition is diagnosed based on your symptoms, medical history, and physical exam.  During the exam, your health care provider may: ? Move your leg into different positions to check for pain. ? Press on the muscles of your hip and  buttock to see if that increases your symptoms.  You may also have tests, including: ? Imaging tests such as X-rays, MRI, or ultrasound. ? Electromyogram (EMG). This test measures electrical signals sent by your nerves into the muscles. ? Nerve conduction study. This test measures how well electrical signals pass through your nerves. How is this treated? This condition may be treated by:  Stopping all activities that cause pain or make your condition worse.  Applying ice or using heat therapy.  Taking medicines to reduce pain and swelling.  Taking a muscle relaxer (muscle relaxant) to stop muscle spasms.  Doing range-of-motion and strengthening exercises (physical therapy) as told by your health care provider.  Massaging the area.  Having acupuncture.  Getting an injection of medicine in the piriformis muscle. Your health care provider will choose the medicine based on your condition. He or she may inject: ? An anti-inflammatory medicine (steroid) to reduce swelling. ? A numbing medicine (local anesthetic) to block the pain. ? Botulinum toxin. The toxin blocks nerve impulses to specific muscles to reduce muscle tension. In rare cases, you may need surgery to cut the muscle and release pressure on the nerve if other treatments do not work. Follow these instructions at home: Activity  Do not sit for long periods. Get up and walk around every 20 minutes or as often as told by your health care provider. ? When driving long distances, make sure to take frequent stops to get up and stretch.  Use a   cushion when you sit on hard surfaces.  Do exercises as told by your health care provider.  Return to your normal activities as told by your health care provider. Ask your health care provider what activities are safe for you. Managing pain, stiffness, and swelling      If directed, apply heat to the affected area as often as told by your health care provider. Use the heat source that  your health care provider recommends, such as a moist heat pack or a heating pad. ? Place a towel between your skin and the heat source. ? Leave the heat on for 20-30 minutes. ? Remove the heat if your skin turns bright red. This is especially important if you are unable to feel pain, heat, or cold. You may have a greater risk of getting burned.  If directed, put ice on the injured area. ? Put ice in a plastic bag. ? Place a towel between your skin and the bag. ? Leave the ice on for 20 minutes, 2-3 times a day. General instructions  Take over-the-counter and prescription medicines only as told by your health care provider.  Ask your health care provider if the medicine prescribed to you requires you to avoid driving or using heavy machinery.  You may need to take actions to prevent or treat constipation, such as: ? Drink enough fluid to keep your urine pale yellow. ? Take over-the-counter or prescription medicines. ? Eat foods that are high in fiber, such as beans, whole grains, and fresh fruits and vegetables. ? Limit foods that are high in fat and processed sugars, such as fried or sweet foods.  Keep all follow-up visits as told by your health care provider. This is important. How is this prevented?  Do not sit for longer than 20 minutes at a time. When you sit, choose padded surfaces.  Warm up and stretch before being active.  Cool down and stretch after being active.  Give your body time to rest between periods of activity.  Make sure to use equipment that fits you.  Maintain physical fitness, including: ? Strength. ? Flexibility. Contact a health care provider if:  Your pain and stiffness continue or get worse.  Your leg or hip becomes weak.  You have changes in your bowel function or bladder function. Summary  Piriformis syndrome is a condition that can cause pain, tingling, and numbness in your buttocks and down the back of your leg.  You may try applying heat  or ice to relieve the pain.  Do not sit for long periods. Get up and walk around every 20 minutes or as often as told by your health care provider. This information is not intended to replace advice given to you by your health care provider. Make sure you discuss any questions you have with your health care provider. Document Revised: 06/25/2018 Document Reviewed: 10/29/2017 Elsevier Patient Education  2020 [employer] Inc.  

## 2019-07-06 NOTE — Assessment & Plan Note (Addendum)
Acute and ongoing, suspect coming from lower back or right hip.  Will obtain imaging for further evaluation.  Increase Gabapentin to 400 MG QHS and will trial a Prednisone taper, she is aware this may increase sugars and to monitor diet with this.  Scripts sent.  Recommend continued use of Tylenol and Voltaren gel as needed.  Discussed trying Icy/Hot Lidocaine patches and alternating heat/ice.  Will determine after imaging resulted next steps, discussed with her that she may benefit from PT, especially due to strenuous lifting at job.  May need assistance with posture and lifting techniques.  Return to office in 6 weeks OR sooner if worsening symptoms.

## 2019-07-07 NOTE — Progress Notes (Signed)
Please let Ms. Jambor know hip imaging returned normal, but lower back did show some degenerative changes to L2-L3, suspect some her pain may be coming from this and nerve pain.  Continue Gabapentin and Prednisone as ordered.  I would recommend physical therapy if no improvement with these.  Return to office as scheduled or sooner if worsening.  Let me know if any questions:)

## 2019-08-04 ENCOUNTER — Other Ambulatory Visit: Payer: Self-pay | Admitting: Family Medicine

## 2019-08-04 NOTE — Telephone Encounter (Signed)
Requested medications are due for refill today?  Yes  Requested medications are on active medication list? Yes   Last Refill:   01/05/2019  # 90 with one refill.    Future visit scheduled?  Yes in 2 weeks.    Notes to Clinic:  Medication failed RX refill protocol due to labs not within established time frame.  Last labs performed on 01/13/2018.

## 2019-08-04 NOTE — Telephone Encounter (Signed)
Routing to provider  

## 2019-08-18 ENCOUNTER — Ambulatory Visit: Payer: BC Managed Care – PPO | Admitting: Family Medicine

## 2019-08-24 ENCOUNTER — Ambulatory Visit: Payer: BC Managed Care – PPO | Admitting: Family Medicine

## 2019-08-28 ENCOUNTER — Other Ambulatory Visit: Payer: Self-pay | Admitting: Family Medicine

## 2019-09-27 ENCOUNTER — Other Ambulatory Visit: Payer: Self-pay | Admitting: Family Medicine

## 2019-10-05 ENCOUNTER — Other Ambulatory Visit: Payer: Self-pay

## 2019-10-05 ENCOUNTER — Encounter: Payer: Self-pay | Admitting: Family Medicine

## 2019-10-05 ENCOUNTER — Ambulatory Visit (INDEPENDENT_AMBULATORY_CARE_PROVIDER_SITE_OTHER): Payer: BC Managed Care – PPO | Admitting: Family Medicine

## 2019-10-05 VITALS — BP 182/101 | HR 72 | Temp 98.2°F | Wt 215.0 lb

## 2019-10-05 DIAGNOSIS — I10 Essential (primary) hypertension: Secondary | ICD-10-CM | POA: Diagnosis not present

## 2019-10-05 DIAGNOSIS — R7301 Impaired fasting glucose: Secondary | ICD-10-CM

## 2019-10-05 DIAGNOSIS — E785 Hyperlipidemia, unspecified: Secondary | ICD-10-CM | POA: Diagnosis not present

## 2019-10-05 MED ORDER — ATORVASTATIN CALCIUM 10 MG PO TABS: 10.0000 mg | ORAL_TABLET | Freq: Every day | ORAL | 1 refills | Status: DC

## 2019-10-05 MED ORDER — LISINOPRIL 40 MG PO TABS: 40.0000 mg | ORAL_TABLET | Freq: Every day | ORAL | 1 refills | Status: DC

## 2019-10-05 MED ORDER — HYDROCHLOROTHIAZIDE 25 MG PO TABS: 25.0000 mg | ORAL_TABLET | Freq: Every day | ORAL | 1 refills | Status: DC

## 2019-10-05 NOTE — Progress Notes (Signed)
BP (!) 182/101   Pulse 72   Temp 98.2 F (36.8 C) (Oral)   Wt 215 lb (97.5 kg)   LMP  (LMP Unknown)   SpO2 96%   BMI 36.90 kg/m    Subjective:    Patient ID: Ashley Mcknight, female    DOB: 1951-02-12, 69 y.o.   MRN: [id number]  HPI: Ashley Mcknight is a 69 y.o. female  Chief Complaint  Patient presents with  . Hypertension  . Hyperlipidemia   Here today for f/u chronic conditions.   HTN - Home BPs running 130s/80s. Taking lisinopril and HCTZ faithfully wihtout side effects. Denies CP, SOB, HAs, dizziness. Not following strict diet but trying to eat well and stay active.   IFG - diet controlled. Denies polyuria, polydipsia, polyphagia.   HLD - on lipitor, tolerating well without myalgias, claudication, CP, SOB.   Relevant past medical, surgical, family and social history reviewed and updated as indicated. Interim medical history since our last visit reviewed. Allergies and medications reviewed and updated.  Review of Systems  Per HPI unless specifically indicated above     Objective:    BP (!) 182/101   Pulse 72   Temp 98.2 F (36.8 C) (Oral)   Wt 215 lb (97.5 kg)   LMP  (LMP Unknown)   SpO2 96%   BMI 36.90 kg/m   Wt Readings from Last 3 Encounters:  10/05/19 215 lb (97.5 kg)  07/06/19 220 lb (99.8 kg)  06/08/19 220 lb 6.4 oz (100 kg)    Physical Exam Vitals and nursing note reviewed.  Constitutional:      Appearance: Normal appearance. She is not ill-appearing.  HENT:     Head: Atraumatic.  Eyes:     Extraocular Movements: Extraocular movements intact.     Conjunctiva/sclera: Conjunctivae normal.  Cardiovascular:     Rate and Rhythm: Normal rate and regular rhythm.     Heart sounds: Normal heart sounds.  Pulmonary:     Effort: Pulmonary effort is normal.     Breath sounds: Normal breath sounds.  Musculoskeletal:        General: Normal range of motion.     Cervical back: Normal range of motion and neck supple.  Skin:    General: Skin is  warm and dry.  Neurological:     Mental Status: She is alert and oriented to person, place, and time.  Psychiatric:        Mood and Affect: Mood normal.        Thought Content: Thought content normal.        Judgment: Judgment normal.     Results for orders placed or performed in visit on 10/05/19  Comprehensive metabolic panel  Result Value Ref Range   Glucose 79 65 - 99 mg/dL   BUN 18 8 - 27 mg/dL   Creatinine, Ser 8.65 (H) 0.57 - 1.00 mg/dL   GFR calc non Af Amer 50 (L) >59 mL/min/1.73   GFR calc Af Amer 57 (L) >59 mL/min/1.73   BUN/Creatinine Ratio 16 12 - 28   Sodium 140 134 - 144 mmol/L   Potassium 4.2 3.5 - 5.2 mmol/L   Chloride 102 96 - 106 mmol/L   CO2 24 20 - 29 mmol/L   Calcium 9.4 8.7 - 10.3 mg/dL   Total Protein 7.2 6.0 - 8.5 g/dL   Albumin 4.3 3.8 - 4.8 g/dL   Globulin, Total 2.9 1.5 - 4.5 g/dL   Albumin/Globulin Ratio 1.5 1.2 - 2.2  Bilirubin Total 0.3 0.0 - 1.2 mg/dL   Alkaline Phosphatase 75 48 - 121 IU/L   AST 10 0 - 40 IU/L   ALT 9 0 - 32 IU/L  Lipid Panel w/o Chol/HDL Ratio  Result Value Ref Range   Cholesterol, Total 157 100 - 199 mg/dL   Triglycerides 468 0 - 149 mg/dL   HDL 71 >03 mg/dL   VLDL Cholesterol Cal 24 5 - 40 mg/dL   LDL Chol Calc (NIH) 62 0 - 99 mg/dL  HgB O1Y  Result Value Ref Range   Hgb A1c MFr Bld 6.0 (H) 4.8 - 5.6 %   Est. average glucose Bld gHb Est-mCnc 126 mg/dL      Assessment & Plan:   Problem List Items Addressed This Visit      Cardiovascular and Mediastinum   Hypertension - Primary    BPs stable and well controlled, continue current regimen      Relevant Medications   lisinopril (ZESTRIL) 40 MG tablet   hydrochlorothiazide (HYDRODIURIL) 25 MG tablet   atorvastatin (LIPITOR) 10 MG tablet   Other Relevant Orders   Comprehensive metabolic panel (Completed)     Endocrine   Impaired fasting glucose    Recheck A1C, adjust as needed. Continue current regimen      Relevant Orders   HgB A1c (Completed)      Other   Hyperlipemia    Recheck lipids, adjust as needed. Continue lipitor regimen and lifestyle modifications      Relevant Medications   lisinopril (ZESTRIL) 40 MG tablet   hydrochlorothiazide (HYDRODIURIL) 25 MG tablet   atorvastatin (LIPITOR) 10 MG tablet   Other Relevant Orders   Lipid Panel w/o Chol/HDL Ratio (Completed)       Follow up plan: Return in about 6 months (around 04/06/2020) for CPE.

## 2019-10-06 LAB — COMPREHENSIVE METABOLIC PANEL
ALT: 9 IU/L (ref 0–32)
AST: 10 IU/L (ref 0–40)
Albumin/Globulin Ratio: 1.5 (ref 1.2–2.2)
Albumin: 4.3 g/dL (ref 3.8–4.8)
Alkaline Phosphatase: 75 IU/L (ref 48–121)
BUN/Creatinine Ratio: 16 (ref 12–28)
BUN: 18 mg/dL (ref 8–27)
Bilirubin Total: 0.3 mg/dL (ref 0.0–1.2)
CO2: 24 mmol/L (ref 20–29)
Calcium: 9.4 mg/dL (ref 8.7–10.3)
Chloride: 102 mmol/L (ref 96–106)
Creatinine, Ser: 1.14 mg/dL — ABNORMAL HIGH (ref 0.57–1.00)
GFR calc Af Amer: 57 mL/min/{1.73_m2} — ABNORMAL LOW (ref >59)
GFR calc non Af Amer: 50 mL/min/{1.73_m2} — ABNORMAL LOW (ref >59)
Globulin, Total: 2.9 g/dL (ref 1.5–4.5)
Glucose: 79 mg/dL (ref 65–99)
Potassium: 4.2 mmol/L (ref 3.5–5.2)
Sodium: 140 mmol/L (ref 134–144)
Total Protein: 7.2 g/dL (ref 6.0–8.5)

## 2019-10-06 LAB — LIPID PANEL W/O CHOL/HDL RATIO
Cholesterol, Total: 157 mg/dL (ref 100–199)
HDL: 71 mg/dL (ref >39)
LDL Chol Calc (NIH): 62 mg/dL (ref 0–99)
Triglycerides: 145 mg/dL (ref 0–149)
VLDL Cholesterol Cal: 24 mg/dL (ref 5–40)

## 2019-10-06 LAB — HEMOGLOBIN A1C
Est. average glucose Bld gHb Est-mCnc: 126 mg/dL
Hgb A1c MFr Bld: 6 % — ABNORMAL HIGH (ref 4.8–5.6)

## 2019-10-07 ENCOUNTER — Encounter: Payer: Self-pay | Admitting: Family Medicine

## 2019-10-10 NOTE — Assessment & Plan Note (Signed)
Recheck A1C, adjust as needed. Continue current regimen. 

## 2019-10-10 NOTE — Assessment & Plan Note (Signed)
BPs stable and well controlled, continue current regimen 

## 2019-10-10 NOTE — Assessment & Plan Note (Signed)
Recheck lipids, adjust as needed. Continue lipitor regimen and lifestyle modifications

## 2019-10-25 ENCOUNTER — Other Ambulatory Visit: Payer: Self-pay | Admitting: Family Medicine

## 2019-11-02 ENCOUNTER — Encounter: Payer: Self-pay | Admitting: Family Medicine

## 2019-11-10 ENCOUNTER — Other Ambulatory Visit: Payer: Self-pay | Admitting: Family Medicine

## 2019-11-29 ENCOUNTER — Other Ambulatory Visit: Payer: BC Managed Care – PPO

## 2019-12-02 ENCOUNTER — Ambulatory Visit: Payer: BC Managed Care – PPO

## 2019-12-05 ENCOUNTER — Other Ambulatory Visit: Payer: Self-pay | Admitting: Family Medicine

## 2019-12-05 NOTE — Telephone Encounter (Signed)
Requested Prescriptions  Pending Prescriptions Disp Refills   lisinopril (ZESTRIL) 40 MG tablet [Pharmacy Med Name: LISINOPRIL 40 MG TABLET] 30 tablet 0    Sig: TAKE 1 TABLET BY MOUTH EVERY DAY     Cardiovascular:  ACE Inhibitors Failed - 12/05/2019 12:50 AM      Failed - Cr in normal range and within 180 days    Creatinine  Date Value Ref Range Status  11/15/2013 0.90 0.60 - 1.30 mg/dL Final   Creatinine, Ser  Date Value Ref Range Status  10/05/2019 1.14 (H) 0.57 - 1.00 mg/dL Final         Failed - Last BP in normal range    BP Readings from Last 1 Encounters:  10/05/19 (!) 182/101         Passed - K in normal range and within 180 days    Potassium  Date Value Ref Range Status  10/05/2019 4.2 3.5 - 5.2 mmol/L Final  11/15/2013 3.3 (L) 3.5 - 5.1 mmol/L Final         Passed - Patient is not pregnant      Passed - Valid encounter within last 6 months    Recent Outpatient Visits          2 months ago Essential hypertension   [facility] Roosvelt Maser [place], [place]   5 months ago Right leg pain   [facility] [place], Corrie Dandy T, NP   6 months ago Right leg pain   [facility] Valentino Nose, NP   1 year ago Essential hypertension   [facility] Particia Nearing, [place]   1 year ago Neck pain   [facility] [place], Salley Hews, [place]      Future Appointments            In 4 months Laural Benes, Oralia Rud, DO [employer] [facility], [facility]

## 2019-12-07 ENCOUNTER — Other Ambulatory Visit: Payer: Self-pay | Admitting: Family Medicine

## 2019-12-07 NOTE — Telephone Encounter (Signed)
Requested Prescriptions  Pending Prescriptions Disp Refills   hydrochlorothiazide (HYDRODIURIL) 25 MG tablet [Pharmacy Med Name: HYDROCHLOROTHIAZIDE 25 MG TAB] 30 tablet 0    Sig: TAKE 1 TABLET BY MOUTH EVERY DAY     Cardiovascular: Diuretics - Thiazide Failed - 12/07/2019  1:15 AM      Failed - Cr in normal range and within 360 days    Creatinine  Date Value Ref Range Status  11/15/2013 0.90 0.60 - 1.30 mg/dL Final   Creatinine, Ser  Date Value Ref Range Status  10/05/2019 1.14 (H) 0.57 - 1.00 mg/dL Final         Failed - Last BP in normal range    BP Readings from Last 1 Encounters:  10/05/19 (!) 182/101         Passed - Ca in normal range and within 360 days    Calcium  Date Value Ref Range Status  10/05/2019 9.4 8.7 - 10.3 mg/dL Final   Calcium, Total  Date Value Ref Range Status  11/15/2013 8.7 8.5 - 10.1 mg/dL Final         Passed - K in normal range and within 360 days    Potassium  Date Value Ref Range Status  10/05/2019 4.2 3.5 - 5.2 mmol/L Final  11/15/2013 3.3 (L) 3.5 - 5.1 mmol/L Final         Passed - Na in normal range and within 360 days    Sodium  Date Value Ref Range Status  10/05/2019 140 134 - 144 mmol/L Final  11/15/2013 140 136 - 145 mmol/L Final         Passed - Valid encounter within last 6 months    Recent Outpatient Visits          2 months ago Essential hypertension   [facility] Roosvelt Maser [place], [place]   5 months ago Right leg pain   [facility] [place], [place] T, NP   6 months ago Right leg pain   [facility] Valentino Nose, NP   1 year ago Essential hypertension   [facility] Particia Nearing, [place]   1 year ago Neck pain   [facility], Salley Hews, [place]      Future Appointments            In 4 months Laural Benes, Oralia Rud, DO [employer], [facility]

## 2019-12-16 ENCOUNTER — Encounter: Payer: Self-pay | Admitting: Unknown Physician Specialty

## 2019-12-16 ENCOUNTER — Other Ambulatory Visit: Payer: Self-pay

## 2019-12-16 ENCOUNTER — Ambulatory Visit (INDEPENDENT_AMBULATORY_CARE_PROVIDER_SITE_OTHER): Payer: BC Managed Care – PPO | Admitting: Unknown Physician Specialty

## 2019-12-16 VITALS — BP 139/82 | HR 85 | Temp 98.2°F | Ht 64.0 in | Wt 213.6 lb

## 2019-12-16 DIAGNOSIS — Z23 Encounter for immunization: Secondary | ICD-10-CM

## 2019-12-16 DIAGNOSIS — F322 Major depressive disorder, single episode, severe without psychotic features: Secondary | ICD-10-CM | POA: Diagnosis not present

## 2019-12-16 DIAGNOSIS — G4700 Insomnia, unspecified: Secondary | ICD-10-CM | POA: Diagnosis not present

## 2019-12-16 MED ORDER — TRAZODONE HCL 50 MG PO TABS: 25.0000 mg | ORAL_TABLET | Freq: Every evening | ORAL | 3 refills | Status: DC | PRN

## 2019-12-16 NOTE — Assessment & Plan Note (Signed)
Pt with depression and insomnia.  Will treat insomnia and see if it helps with depression and stress.  Start Trazadone 50 mg QHS.  Consider adding SSRI next visit or changing Trazadone to Mirtazepine

## 2019-12-16 NOTE — Progress Notes (Signed)
BP 139/82 (BP Location: Left Arm, Patient Position: Sitting, Cuff Size: Large)   Pulse 85   Temp 98.2 F (36.8 C) (Oral)   Ht 5\' 4"  (1.626 m)   Wt 213 lb 9.6 oz (96.9 kg)   LMP  (LMP Unknown)   SpO2 96%   BMI 36.66 kg/m    Subjective:    Patient ID: , female    DOB: 07-Feb-1951, 69 y.o.   MRN: [id number]  HPI: Ashley Mcknight is a 69 y.o. female  Chief Complaint  Patient presents with  . Insomnia   Patient presents with trouble falling and staying asleep since her spouse's accident 6 weeks ago. Verbalizes, "I  cries all the time since my spouse had an accident at home, tripped over foot stool and now paralyzed". During conversation about the above patient became tearful. She has tried Melatonin OTC for the past 2 weeks without effect.   Depression screen [facility] 2/9 12/16/2019 01/13/2018 02/14/2017 04/28/2015  Decreased Interest 3 0 0 0  Down, Depressed, Hopeless 3 0 0 0  PHQ - 2 Score 6 0 0 0  Altered sleeping 3 0 0 -  Tired, decreased energy 3 0 0 -  Change in appetite 3 0 0 -  Feeling bad or failure about yourself  0 0 0 -  Trouble concentrating 3 0 0 -  Moving slowly or fidgety/restless 3 0 0 -  Suicidal thoughts 0 0 0 -  PHQ-9 Score 21 0 0 -  Difficult doing work/chores Not difficult at all - Not difficult at all -    Relevant past medical, surgical, family and social history reviewed and updated as indicated. Interim medical history since our last visit reviewed. Allergies and medications reviewed and updated.  Review of Systems  Constitutional: Negative.   HENT: Negative.   Eyes: Negative.   Respiratory: Negative.   Cardiovascular: Negative.   Gastrointestinal: Negative.   Endocrine: Negative.   Genitourinary: Negative.   Musculoskeletal: Positive for neck stiffness (muscle tension).  Skin: Negative.   Allergic/Immunologic: Negative.   Neurological: Negative.   Hematological: Negative.   Psychiatric/Behavioral: Positive for sleep  disturbance.        Objective:    BP 139/82 (BP Location: Left Arm, Patient Position: Sitting, Cuff Size: Large)   Pulse 85   Temp 98.2 F (36.8 C) (Oral)   Ht 5\' 4"  (1.626 m)   Wt 213 lb 9.6 oz (96.9 kg)   LMP  (LMP Unknown)   SpO2 96%   BMI 36.66 kg/m   Wt Readings from Last 3 Encounters:  12/16/19 213 lb 9.6 oz (96.9 kg)  10/05/19 215 lb (97.5 kg)  07/06/19 220 lb (99.8 kg)    Physical Exam Vitals reviewed.  HENT:     Head: Normocephalic.     Nose: Nose normal.  Pulmonary:     Effort: Pulmonary effort is normal.  Musculoskeletal:        General: Normal range of motion.     Cervical back: Normal range of motion.  Skin:    General: Skin is warm and dry.  Neurological:     General: No focal deficit present.     Mental Status: She is oriented to person, place, and time.  Psychiatric:        Attention and Perception: Attention and perception normal.        Mood and Affect: Mood is depressed. Affect is flat and tearful.     Comments:  Results for orders placed or performed in visit on 10/05/19  Comprehensive metabolic panel  Result Value Ref Range   Glucose 79 65 - 99 mg/dL   BUN 18 8 - 27 mg/dL   Creatinine, Ser 7.61 (H) 0.57 - 1.00 mg/dL   GFR calc non Af Amer 50 (L) >59 mL/min/1.73   GFR calc Af Amer 57 (L) >59 mL/min/1.73   BUN/Creatinine Ratio 16 12 - 28   Sodium 140 134 - 144 mmol/L   Potassium 4.2 3.5 - 5.2 mmol/L   Chloride 102 96 - 106 mmol/L   CO2 24 20 - 29 mmol/L   Calcium 9.4 8.7 - 10.3 mg/dL   Total Protein 7.2 6.0 - 8.5 g/dL   Albumin 4.3 3.8 - 4.8 g/dL   Globulin, Total 2.9 1.5 - 4.5 g/dL   Albumin/Globulin Ratio 1.5 1.2 - 2.2   Bilirubin Total 0.3 0.0 - 1.2 mg/dL   Alkaline Phosphatase 75 48 - 121 IU/L   AST 10 0 - 40 IU/L   ALT 9 0 - 32 IU/L  Lipid Panel w/o Chol/HDL Ratio  Result Value Ref Range   Cholesterol, Total 157 100 - 199 mg/dL   Triglycerides 950 0 - 149 mg/dL   HDL 71 >93 mg/dL   VLDL Cholesterol Cal 24 5 - 40  mg/dL   LDL Chol Calc (NIH) 62 0 - 99 mg/dL  HgB O6Z  Result Value Ref Range   Hgb A1c MFr Bld 6.0 (H) 4.8 - 5.6 %   Est. average glucose Bld gHb Est-mCnc 126 mg/dL      Assessment & Plan:   Problem List Items Addressed This Visit      Unprioritized   Depression, major, single episode, severe (HCC)    New diagnosis triggered by traumatic event and life change.  In addition to Trazadone, discussed self care, and to seek pastoral counseling      Relevant Medications   traZODone (DESYREL) 50 MG tablet   Insomnia - Primary    Pt with depression and insomnia.  Will treat insomnia and see if it helps with depression and stress.  Start Trazadone 50 mg QHS.  Consider adding SSRI next visit or changing Trazadone to Mirtazepine       Other Visit Diagnoses    Influenza vaccine needed       Relevant Orders   Flu Vaccine QUAD High Dose(Fluad) (Completed)       Follow up plan: Return in about 1 week (around 12/23/2019).

## 2019-12-16 NOTE — Assessment & Plan Note (Signed)
New diagnosis triggered by traumatic event and life change.  In addition to Trazadone, discussed self care, and to seek pastoral counseling

## 2019-12-23 ENCOUNTER — Ambulatory Visit (INDEPENDENT_AMBULATORY_CARE_PROVIDER_SITE_OTHER): Payer: BC Managed Care – PPO | Admitting: Unknown Physician Specialty

## 2019-12-23 ENCOUNTER — Encounter: Payer: Self-pay | Admitting: Unknown Physician Specialty

## 2019-12-23 ENCOUNTER — Other Ambulatory Visit: Payer: Self-pay | Admitting: Unknown Physician Specialty

## 2019-12-23 ENCOUNTER — Other Ambulatory Visit: Payer: Self-pay

## 2019-12-23 ENCOUNTER — Telehealth: Payer: Self-pay

## 2019-12-23 DIAGNOSIS — G4700 Insomnia, unspecified: Secondary | ICD-10-CM | POA: Diagnosis not present

## 2019-12-23 DIAGNOSIS — F322 Major depressive disorder, single episode, severe without psychotic features: Secondary | ICD-10-CM

## 2019-12-23 NOTE — Assessment & Plan Note (Addendum)
Insomnia improved since taking full dosage of Trazodone 50mg  QHS. Verbalizes less stress and less tense. Continue current therapy of Trazadone 50ng QHS. RTC in one month.

## 2019-12-23 NOTE — Chronic Care Management (AMB) (Signed)
  Care Management   Outreach Note  12/23/2019 Name: Ashley Mcknight MRN: [id number] DOB: December 08, 1950  Referred by: Particia Nearing, PA-C Reason for referral : Care Coordination (Outreach to schedule referral for LCSW )   An unsuccessful telephone outreach was attempted today. The patient was referred to the case management team for assistance with care management and care coordination.   Follow Up Plan: A HIPAA compliant phone message was left for the patient providing contact information and requesting a return call.  The care management team will reach out to the patient again over the next 7 days.  If patient returns call to provider office, please advise to call Embedded Care Management Care Guide Porfirio Bollier at [phone]  Penne Lash, RMA Care Guide, Embedded Care Coordination [facility]  [place], [place] [address] Direct Dial: [phone] [email or web address]

## 2019-12-23 NOTE — Progress Notes (Signed)
BP (!) 145/83 (BP Location: Left Arm, Patient Position: Sitting, Cuff Size: Large)   Pulse 76   Temp 98.7 F (37.1 C) (Oral)   Ht 5\' 4"  (1.626 m)   Wt 215 lb (97.5 kg)   LMP  (LMP Unknown)   SpO2 99%   BMI 36.90 kg/m    Subjective:    Patient ID: , female    DOB: 1950/09/19, 69 y.o.   MRN: [id number]  HPI: Ashley Mcknight is a 69 y.o. female presents for follow up with depression and insomnia.  Trazodone 50mg  QHS started last week. Patient only took 1/2 tablet for first 2-3 days due to fearfulness of adverse reaction, after none noted she increased to full dose of 50mg  QHS. She expresses she is less tense and is not crying spontaneously anymore. Sleep pattern has improved and is achieving a minimal of 6 hours nightly. She denies seeking spiritual counseling as relationship with church is strain but verbalizes strong family support from her five children and family. Spouse is scheduled to discharge home on the 29th.   Chief Complaint  Patient presents with  . Depression  . Insomnia   Depression screen [facility] 2/9 12/23/2019 12/16/2019 01/13/2018 02/14/2017 04/28/2015  Decreased Interest 0 3 0 0 0  Down, Depressed, Hopeless 3 3 0 0 0  PHQ - 2 Score 3 6 0 0 0  Altered sleeping 3 3 0 0 -  Tired, decreased energy 3 3 0 0 -  Change in appetite 1 3 0 0 -  Feeling bad or failure about yourself  0 0 0 0 -  Trouble concentrating 0 3 0 0 -  Moving slowly or fidgety/restless 1 3 0 0 -  Suicidal thoughts 0 0 0 0 -  PHQ-9 Score 11 21 0 0 -  Difficult doing work/chores Not difficult at all Not difficult at all - Not difficult at all -    Relevant past medical, surgical, family and social history reviewed and updated as indicated. Interim medical history since our last visit reviewed. Allergies and medications reviewed and updated.  Review of Systems  All other systems reviewed and are negative.   Per HPI unless specifically indicated above       Objective:    BP (!)  145/83 (BP Location: Left Arm, Patient Position: Sitting, Cuff Size: Large)   Pulse 76   Temp 98.7 F (37.1 C) (Oral)   Ht 5\' 4"  (1.626 m)   Wt 215 lb (97.5 kg)   LMP  (LMP Unknown)   SpO2 99%   BMI 36.90 kg/m   Wt Readings from Last 3 Encounters:  12/23/19 215 lb (97.5 kg)  12/16/19 213 lb 9.6 oz (96.9 kg)  10/05/19 215 lb (97.5 kg)    Physical Exam Vitals reviewed.  Constitutional:      Appearance: Normal appearance.  Cardiovascular:     Rate and Rhythm: Normal rate and regular rhythm.     Pulses: Normal pulses.     Heart sounds: Normal heart sounds.  Pulmonary:     Effort: Pulmonary effort is normal.     Breath sounds: Normal breath sounds.  Skin:    General: Skin is warm and dry.  Neurological:     General: No focal deficit present.     Mental Status: She is alert and oriented to person, place, and time. Mental status is at baseline.  Psychiatric:        Mood and Affect: Mood normal.  Behavior: Behavior normal.        Thought Content: Thought content normal.        Judgment: Judgment normal.         Assessment & Plan:   Problem List Items Addressed This Visit      Other   Insomnia     Insomnia improved since taking full dosage of Trazodone 50mg  QHS. Verbalizes less stress and less tense. Continue current therapy of Trazadone 50ng QHS. RTC in one month.       Depression, major, single episode, severe (HCC)    PHQ9 results today 11 decreased from 21 last week. Depressive mood improved per patient, states she is less tense and stressed and spontaneous crying spells are gone. Continue current treatment, Trazadone 50mg  QHS. F/U in one month.           Follow up plan: Return in about 4 weeks (around 01/20/2020) for Depression and insomnia rescreen.

## 2019-12-23 NOTE — Assessment & Plan Note (Addendum)
PHQ9 results today 11 decreased from 21 last week. Depressive mood improved per patient, states she is less tense and stressed and spontaneous crying spells are gone. Continue current treatment, Trazadone 50mg  QHS. F/U in one month.

## 2019-12-28 ENCOUNTER — Telehealth: Payer: Self-pay

## 2019-12-28 NOTE — Chronic Care Management (AMB) (Signed)
  Care Management   Note  12/28/2019 Name: Ashley Mcknight MRN: [id number] DOB: Jun 05, 1950  Ashley Mcknight is a 69 y.o. year old female who is a primary care patient of Particia Nearing, [place]. I reached out to Skeet Simmer Flaharty by phone today in response to a referral sent by Ms. [name]'s PCP.    [name] was given information about care management services today including:  1. Care management services include personalized support from designated clinical staff supervised by her physician, including individualized plan of care and coordination with other care providers 2. 24/7 contact phone numbers for assistance for urgent and routine care needs. 3. The patient may stop care management services at any time by phone call to the office staff.  Patient agreed to services and verbal consent obtained.   Follow up plan: Telephone appointment with care management team member scheduled HAL:PFXT 01/10/2020  .Penne Lash, RMA Care Guide, Embedded Care Coordination [facility]  [place], [place] [address] Direct Dial: [phone] [email or web address]

## 2019-12-28 NOTE — Progress Notes (Signed)
ERROR

## 2020-01-10 ENCOUNTER — Telehealth: Payer: BC Managed Care – PPO

## 2020-01-10 ENCOUNTER — Telehealth: Payer: Self-pay | Admitting: *Deleted

## 2020-01-10 ENCOUNTER — Other Ambulatory Visit: Payer: Self-pay | Admitting: Family Medicine

## 2020-01-10 NOTE — Chronic Care Management (AMB) (Signed)
  Care Management   Note  01/10/2020 Name: Ashley Mcknight MRN: [id number] DOB: 03/05/1951  Ashley Mcknight is a 69 y.o. year old female who is a primary care patient of Particia Nearing, Cordelia Poche and is actively engaged with the care management team. I reached out to Skeet Simmer Schley by phone today to assist with re-scheduling an initial visit with the Licensed Clinical Child psychotherapist.  Follow up plan: Unsuccessful telephone outreach attempt made. A HIPAA compliant phone message was left for the patient providing contact information and requesting a return call. The care management team will reach out to the patient again over the next 7-10 days. If patient returns call to provider office, please advise to call Embedded Care Management Care Guide Gwenevere Ghazi at [phone].  Gwenevere Ghazi  Care Guide, Embedded Care Coordination [facility] Management

## 2020-01-18 NOTE — Chronic Care Management (AMB) (Signed)
°  Care Management   Note  01/18/2020 Name: Ashley Mcknight MRN: [id number] DOB: May 30, 1950  Ashley Mcknight is a 69 y.o. year old female who is a primary care patient of Particia Nearing, Cordelia Poche and is actively engaged with the care management team. I reached out to Skeet Simmer Hollinger by phone today to assist with re-scheduling an initial visit with the Licensed Clinical Child psychotherapist.  Follow up plan: Telephone appointment with care management team member scheduled for:02/25/2020 [facility] Guide, Embedded Care Coordination [facility] Management

## 2020-01-18 NOTE — Chronic Care Management (AMB) (Signed)
  Care Management   Note  01/18/2020 Name: Ashley Mcknight MRN: [id number] DOB: [birth year]  Ashley Mcknight is a 69 y.o. year old female who is a primary care patient of Particia Nearing, Cordelia Poche and is actively engaged with the care management team. I reached out to Skeet Simmer Arnaud by phone today to assist with re-scheduling an initial visit with the Licensed Clinical Social Worker  Follow up plan: Unsuccessful telephone outreach attempt made. A HIPAA compliant phone message was left for the patient providing contact information and requesting a return call.  The care management team will reach out to the patient again over the next 7 days.  If patient returns call to provider office, please advise to call Embedded Care Management Care Guide Gwenevere Ghazi at [phone].  Gwenevere Ghazi  Care Guide, Embedded Care Coordination [facility] Management

## 2020-01-27 ENCOUNTER — Ambulatory Visit: Payer: BC Managed Care – PPO | Admitting: Unknown Physician Specialty

## 2020-02-06 ENCOUNTER — Other Ambulatory Visit: Payer: Self-pay | Admitting: Family Medicine

## 2020-02-25 ENCOUNTER — Ambulatory Visit: Payer: BC Managed Care – PPO | Admitting: Licensed Clinical Social Worker

## 2020-02-25 NOTE — Chronic Care Management (AMB) (Signed)
  Chronic Care Management    Clinical Social Work General Note  02/25/2020 Name: Ashley Mcknight MRN: [id number] DOB: [birth year]  Ashley Mcknight is a 69 y.o. year old female who is a primary care patient of Volney American, [place]. The CCM was consulted to assist the patient with Mental Health Counseling and Resources.   Ashley Mcknight was given information about Chronic Care Management services today including:  1. CCM service includes personalized support from designated clinical staff supervised by her physician, including individualized plan of care and coordination with other care providers 2. 24/7 contact phone numbers for assistance for urgent and routine care needs. 3. Service will only be billed when office clinical staff spend 20 minutes or more in a month to coordinate care. 4. Only one practitioner may furnish and bill the service in a calendar month. 5. The patient may stop CCM services at any time (effective at the end of the month) by phone call to the office staff. 6. The patient will be responsible for cost sharing (co-pay) of up to 20% of the service fee (after annual deductible is met).  Patient wishes to consider information provided and/or speak with a member of the care team before deciding about enrollment in care management services.   Review of patient status, including review of consultants reports, relevant laboratory and other test results, and collaboration with appropriate care team members and the patient's provider was performed as part of comprehensive patient evaluation and provision of chronic care management services.    SDOH (Social Determinants of Health) assessments and interventions performed:  Yes   CCM LCSW received new referral for depression management. Patient shares that she is not sure if she is interested in CCM program or depression resources at this time and would like to take some time to consider this. Patient prefers a call in 60 day to  reassess her needs.     Outpatient Encounter Medications as of 02/25/2020  Medication Sig  . aspirin 81 MG tablet Take 81 mg by mouth daily.  Marland Kitchen atorvastatin (LIPITOR) 10 MG tablet TAKE 1 TABLET BY MOUTH EVERY DAY  . hydrochlorothiazide (HYDRODIURIL) 25 MG tablet TAKE 1 TABLET BY MOUTH EVERY DAY  . lisinopril (ZESTRIL) 40 MG tablet TAKE 1 TABLET BY MOUTH EVERY DAY  . meloxicam (MOBIC) 15 MG tablet TAKE 1 TABLET (15 MG TOTAL) BY MOUTH DAILY AS NEEDED.  Marland Kitchen tiZANidine (ZANAFLEX) 4 MG tablet Take 4 mg by mouth every 6 (six) hours as needed for muscle spasms.  . traZODone (DESYREL) 50 MG tablet Take 0.5-1 tablets (25-50 mg total) by mouth at bedtime as needed for sleep.   No facility-administered encounter medications on file as of 02/25/2020.    Follow Up Plan: SW will follow up with patient by phone over the next 60 days      Eula Fried, [place], MSW, [email or web address] Phone: [phone]

## 2020-02-26 ENCOUNTER — Encounter: Payer: Self-pay | Admitting: Nurse Practitioner

## 2020-02-26 DIAGNOSIS — N183 Chronic kidney disease, stage 3 unspecified: Secondary | ICD-10-CM | POA: Insufficient documentation

## 2020-03-01 ENCOUNTER — Ambulatory Visit: Payer: BC Managed Care – PPO | Admitting: Nurse Practitioner

## 2020-03-25 ENCOUNTER — Other Ambulatory Visit: Payer: Self-pay | Admitting: Family Medicine

## 2020-03-26 NOTE — Telephone Encounter (Signed)
Pt has appt with Dr. Laural Benes 04/12/20

## 2020-04-11 ENCOUNTER — Ambulatory Visit: Payer: BC Managed Care – PPO | Admitting: Family Medicine

## 2020-04-12 ENCOUNTER — Encounter: Payer: BC Managed Care – PPO | Admitting: Family Medicine

## 2020-04-28 ENCOUNTER — Telehealth: Payer: Self-pay

## 2020-05-08 ENCOUNTER — Encounter: Payer: Self-pay | Admitting: Family Medicine

## 2020-05-31 ENCOUNTER — Other Ambulatory Visit: Payer: Self-pay | Admitting: Family Medicine

## 2020-05-31 NOTE — Telephone Encounter (Signed)
Requested Prescriptions  Pending Prescriptions Disp Refills  . lisinopril (ZESTRIL) 40 MG tablet [Pharmacy Med Name: LISINOPRIL 40 MG TABLET] 90 tablet 0    Sig: TAKE 1 TABLET BY MOUTH EVERY DAY     Cardiovascular:  ACE Inhibitors Failed - 05/31/2020  1:21 AM      Failed - Cr in normal range and within 180 days    Creatinine  Date Value Ref Range Status  11/15/2013 0.90 0.60 - 1.30 mg/dL Final   Creatinine, Ser  Date Value Ref Range Status  10/05/2019 1.14 (H) 0.57 - 1.00 mg/dL Final         Failed - K in normal range and within 180 days    Potassium  Date Value Ref Range Status  10/05/2019 4.2 3.5 - 5.2 mmol/L Final  11/15/2013 3.3 (L) 3.5 - 5.1 mmol/L Final         Failed - Last BP in normal range    BP Readings from Last 1 Encounters:  12/23/19 (!) 145/83         Passed - Patient is not pregnant      Passed - Valid encounter within last 6 months    Recent Outpatient Visits          5 months ago Depression, major, single episode, severe (HCC)   [facility] Gabriel Cirri, NP   5 months ago Insomnia, unspecified type   [facility] Gabriel Cirri, NP   7 months ago Essential hypertension   [facility] Particia Nearing, [place]   11 months ago Right leg pain   [facility] [place], [place] T, NP   11 months ago Right leg pain   [facility] Valentino Nose, NP      Future Appointments            In 6 days Laural Benes, Oralia Rud, DO [facility], [facility]           . hydrochlorothiazide (HYDRODIURIL) 25 MG tablet [Pharmacy Med Name: HYDROCHLOROTHIAZIDE 25 MG TAB] 90 tablet 0    Sig: TAKE 1 TABLET BY MOUTH EVERY DAY     Cardiovascular: Diuretics - Thiazide Failed - 05/31/2020  1:21 AM      Failed - Cr in normal range and within 360 days    Creatinine  Date Value Ref Range Status  11/15/2013 0.90 0.60 - 1.30 mg/dL Final   Creatinine, Ser  Date Value Ref Range Status  10/05/2019 1.14 (H)  0.57 - 1.00 mg/dL Final         Failed - Last BP in normal range    BP Readings from Last 1 Encounters:  12/23/19 (!) 145/83         Passed - Ca in normal range and within 360 days    Calcium  Date Value Ref Range Status  10/05/2019 9.4 8.7 - 10.3 mg/dL Final   Calcium, Total  Date Value Ref Range Status  11/15/2013 8.7 8.5 - 10.1 mg/dL Final         Passed - K in normal range and within 360 days    Potassium  Date Value Ref Range Status  10/05/2019 4.2 3.5 - 5.2 mmol/L Final  11/15/2013 3.3 (L) 3.5 - 5.1 mmol/L Final         Passed - Na in normal range and within 360 days    Sodium  Date Value Ref Range Status  10/05/2019 140 134 - 144 mmol/L Final  11/15/2013 140 136 -  145 mmol/L Final         Passed - Valid encounter within last 6 months    Recent Outpatient Visits          5 months ago Depression, major, single episode, severe (HCC)   [facility] Gabriel Cirri, NP   5 months ago Insomnia, unspecified type   [facility] Gabriel Cirri, NP   7 months ago Essential hypertension   [facility] Roosvelt Maser [place], [place]   11 months ago Right leg pain   [facility] [place], [place] T, NP   11 months ago Right leg pain   [facility] Valentino Nose, NP      Future Appointments            In 6 days Laural Benes, Oralia Rud, DO [facility], [facility]

## 2020-06-01 ENCOUNTER — Ambulatory Visit (INDEPENDENT_AMBULATORY_CARE_PROVIDER_SITE_OTHER): Payer: Medicaid Other | Admitting: Licensed Clinical Social Worker

## 2020-06-01 DIAGNOSIS — I10 Essential (primary) hypertension: Secondary | ICD-10-CM

## 2020-06-01 DIAGNOSIS — E785 Hyperlipidemia, unspecified: Secondary | ICD-10-CM

## 2020-06-01 DIAGNOSIS — G4700 Insomnia, unspecified: Secondary | ICD-10-CM

## 2020-06-01 DIAGNOSIS — F322 Major depressive disorder, single episode, severe without psychotic features: Secondary | ICD-10-CM | POA: Diagnosis not present

## 2020-06-01 NOTE — Patient Instructions (Signed)
Licensed Clinical Social Worker Visit Information  Goals we discussed today:  Goals Addressed            This Visit's Progress   . Track and Manage My Symptoms-Depression       Timeframe:  Long Term Goal Priority:  Medium Start Date:  06/01/20                           Expected End Date:  09/01/20                    Follow Up Date-07/14/20   - avoid negative self-talk - develop a personal safety plan - develop a plan to deal with triggers like holidays, anniversaries - exercise at least 2 to 3 times per week - have a plan for how to handle bad days - journal feelings and what helps to feel better or worse - spend time or talk with others at least 2 to 3 times per week - spend time or talk with others every day - watch for early signs of feeling worse - write in journal every day    Why is this important?    Keeping track of your progress will help your treatment team find the right mix of medicine and therapy for you.   Write in your journal every day.   Day-to-day changes in depression symptoms are normal. It may be more helpful to check your progress at the end of each week instead of every day.      Current barriers:   . Chronic Mental Health needs related to depression and caregiver strain  Currently unable to independently self manage needs related to mental health conditions.  Knowledge Deficits related to short term plan for care coordination needs and long term plans for chronic disease management . Lacks knowledge of where and how to connect for mental health support. Patient is unsure if she wishes to pursue mental health resources at this time but will discuss with PCP during appointment on 06/06/20. Marland Kitchen Needs Support, Education, and Care Coordination in order to meet unmet need.   Clinical Goal(s): Over the next 120 days, patient will implement healthy self-care and work with SW to reduce or manage symptoms of agitation, anxiety, depression, insomnia, mood  instability, and stress until connected for ongoing counseling.   Clinical Interventions:  . Assessed patient's previous treatment, needs, coping skills, current treatment, support system and barriers to care . 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 Patient reports experiencing depressive symptoms and low energy. 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 She reports that she has 2 daughters that help with caregiving and also are experiencing caregiver burnout symptoms. Patient reports that 1 daughter lives in [place] and the other in [place]. Patient was strongly encouraged to consider medication therapy and counseling and was provided extensive education on available mental health resources that accept her insurance but patient shares that she will need time to consider this resource implementation first.  . Patient interviewed and appropriate assessments performed or reviewed: brief mental health assessment;Suicidal Ideation/Homicidal Ideation: No . Provided basic mental health support, education and interventions . Discussed several options  for long term counseling based on need and insurance. Assisted patient with narrowing the options down to [facility] or [facility]. However, patient is currently hesitant about starting mental health treatment and would like time to consider these treatment options.  . Reviewed medications with patient prescribed by PCP and discussed compliance  . Other interventions include: Solution-Focused Strategies . Mindfulness or Relaxation Training . Emotional/Supportive Counseling  . Patient was informed that current CCM LCSW will be leaving position next month and her next CCM Social Work follow up  visit will be with another LCSW. Patient was appreciative of support provided and receptive to news . LCSW discussed coping skills for anxiety. SW used empathetic and active and reflective listening, validated patient's feelings/concerns, and provided emotional support. LCSW provided self-care education to help manage her multiple health conditions and improve her mood. Development of long term plan of care and institution of self health management strategies completed. LCSW provided education on relaxation techniques such as meditation, deep breathing, massage, grounding exercsies or yoga that can activate the body's relaxation response and ease symptoms of stress and anxiety.  Marland Kitchen Collaboration with PCP regarding development and update of comprehensive plan of care as evidenced by provider attestation and co-signature . Inter-disciplinary care team collaboration (see longitudinal plan of care)  Patient Goals/Self-Care Activities: Over the next 120 days . Implement interventions discussed today to decreases symptoms of agitation, anxiety, depression, insomnia, mood instability, and stress and increase knowledge and/or ability of: coping skills, healthy habits, self-management skills, and stress reduction.        Ms. Goheen was given information about Chronic Care Management services today including:  1. CCM service includes personalized support from designated clinical staff supervised by her physician, including individualized plan of care and coordination with other care providers 2. 24/7 contact phone numbers for assistance for urgent and routine care needs. 3. Service will only be billed when office clinical staff spend 20 minutes or more in a month to coordinate care. 4. Only one practitioner may furnish and bill the service in a calendar month. 5. The patient may stop CCM services at any time (effective at the end of the month) by phone call to the office staff. 6. The patient will be responsible  for cost sharing (co-pay) of up to 20% of the service fee (after annual deductible is met).  Patient agreed to services and verbal consent obtained.   Eula Fried, BSW, MSW, [facility] [email or web address] Phone: [phone]

## 2020-06-01 NOTE — Chronic Care Management (AMB) (Signed)
Chronic Care Management    Clinical Social Work Note  06/01/2020 Name: Ashley Mcknight MRN: [id number] DOB: October 07, 1950  Ashley Mcknight is a 70 y.o. year old female who is a primary care patient of Jon Billings, NP. The CCM team was consulted to assist the patient with chronic disease management and/or care coordination needs related to: Mental Health Counseling and Resources.   Engaged with patient by telephone for initial visit in response to provider referral for social work chronic care management and care coordination services.   Consent to Services:  The patient was given the following information about Chronic Care Management services today, agreed to services, and gave verbal consent: 1. CCM service includes personalized support from designated clinical staff supervised by the primary care provider, including individualized plan of care and coordination with other care providers 2. 24/7 contact phone numbers for assistance for urgent and routine care needs. 3. Service will only be billed when office clinical staff spend 20 minutes or more in a month to coordinate care. 4. Only one practitioner may furnish and bill the service in a calendar month. 5.The patient may stop CCM services at any time (effective at the end of the month) by phone call to the office staff. 6. The patient will be responsible for cost sharing (co-pay) of up to 20% of the service fee (after annual deductible is met). Patient agreed to services and consent obtained.  Patient agreed to services and consent obtained.   Assessment: Review of patient past medical history, allergies, medications, and health status, including review of relevant consultants reports was performed today as part of a comprehensive evaluation and provision of chronic care management and care coordination services.     SDOH (Social Determinants of Health) assessments and interventions performed:    Advanced Directives Status: Not addressed  in this encounter.  CCM Care Plan  No Known Allergies  Outpatient Encounter Medications as of 06/01/2020  Medication Sig  . aspirin 81 MG tablet Take 81 mg by mouth daily.  Marland Kitchen atorvastatin (LIPITOR) 10 MG tablet TAKE 1 TABLET BY MOUTH EVERY DAY  . hydrochlorothiazide (HYDRODIURIL) 25 MG tablet TAKE 1 TABLET BY MOUTH EVERY DAY  . lisinopril (ZESTRIL) 40 MG tablet TAKE 1 TABLET BY MOUTH EVERY DAY  . meloxicam (MOBIC) 15 MG tablet TAKE 1 TABLET (15 MG TOTAL) BY MOUTH DAILY AS NEEDED.  Marland Kitchen tiZANidine (ZANAFLEX) 4 MG tablet Take 4 mg by mouth every 6 (six) hours as needed for muscle spasms.  . traZODone (DESYREL) 50 MG tablet Take 0.5-1 tablets (25-50 mg total) by mouth at bedtime as needed for sleep.   No facility-administered encounter medications on file as of 06/01/2020.    Patient Active Problem List   Diagnosis Date Noted  . CKD (chronic kidney disease) stage 3, GFR 30-59 ml/min (HCC) 02/26/2020  . Insomnia 12/16/2019  . Depression, major, single episode, severe ([facility]) 12/16/2019  . Right leg pain 06/08/2019  . Hyperlipemia 09/30/2017  . Hypertension 09/15/2014  . Sleep related leg cramps 09/15/2014  . Impaired fasting glucose 09/15/2014  . Obesity 09/15/2014    Conditions to be addressed/monitored: Depression; Mental Health Concerns  and Limited access to caregiver  Care Plan : General Social Work (Adult)  Updates made by Greg Cutter, LCSW since 06/01/2020 12:00 AM    Problem: Caregiver Stress     Long-Range Goal: Caregiver Coping Optimized   Start Date: 06/01/2020  Priority: Medium  Note:   Timeframe:  Long Term Goal Priority:  Medium  Start Date:  06/01/20                           Expected End Date:  09/01/20                    Follow Up Date-07/14/20   - avoid negative self-talk - develop a personal safety plan - develop a plan to deal with triggers like holidays, anniversaries - exercise at least 2 to 3 times per week - have a plan for how to handle bad days -  journal feelings and what helps to feel better or worse - spend time or talk with others at least 2 to 3 times per week - spend time or talk with others every day - watch for early signs of feeling worse - write in journal every day    Why is this important?    Keeping track of your progress will help your treatment team find the right mix of medicine and therapy for you.   Write in your journal every day.   Day-to-day changes in depression symptoms are normal. It may be more helpful to check your progress at the end of each week instead of every day.      Current barriers:   . Chronic Mental Health needs related to depression and caregiver strain  Currently unable to independently self manage needs related to mental health conditions.  Knowledge Deficits related to short term plan for care coordination needs and long term plans for chronic disease management . Lacks knowledge of where and how to connect for mental health support. Patient is unsure if she wishes to pursue mental health resources at this time but will discuss with PCP during appointment on 06/06/20. Marland Kitchen Needs Support, Education, and Care Coordination in order to meet unmet need.   Clinical Goal(s): Over the next 120 days, patient will implement healthy self-care and work with SW to reduce or manage symptoms of agitation, anxiety, depression, insomnia, mood instability, and stress until connected for ongoing counseling.   Clinical Interventions:  . Assessed patient's previous treatment, needs, coping skills, current treatment, support system and barriers to care . 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 Patient reports experiencing depressive symptoms and low energy. 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 She reports that she has 2 daughters that help with caregiving and also are experiencing caregiver burnout symptoms. Patient reports that 1 daughter lives in [place] and the other in [place]. Patient was strongly encouraged to consider medication therapy and counseling and was provided extensive education on available mental health resources that accept her insurance but patient shares that she will need time to consider this resource implementation first.  . Patient interviewed and appropriate assessments performed or reviewed: brief mental health assessment;Suicidal Ideation/Homicidal Ideation: No . Provided basic mental health support, education and interventions . Discussed several options for long term counseling based on need and insurance. Assisted patient with narrowing the options down to [facility] or [facility]. However, patient is currently hesitant about starting mental health treatment and would like time to consider these treatment options.  . Reviewed medications with patient prescribed by PCP  and discussed compliance  . Other interventions include: Solution-Focused Strategies . Mindfulness or Relaxation Training . Emotional/Supportive Counseling  . Patient was informed that current CCM LCSW will be leaving position next month and her next CCM Social Work follow up visit will be with another LCSW. Patient was appreciative of support provided and receptive to news . LCSW discussed coping skills for anxiety. SW used empathetic and active and reflective listening, validated patient's feelings/concerns, and provided emotional support. LCSW provided self-care education to help manage her multiple health conditions and improve her mood. Development of long term plan of care and institution of self health management strategies completed. LCSW provided education on relaxation techniques such as meditation, deep breathing, massage, grounding  exercsies or yoga that can activate the body's relaxation response and ease symptoms of stress and anxiety.  Marland Kitchen Collaboration with PCP regarding development and update of comprehensive plan of care as evidenced by provider attestation and co-signature . Inter-disciplinary care team collaboration (see longitudinal plan of care)  Patient Goals/Self-Care Activities: Over the next 120 days . Implement interventions discussed today to decreases symptoms of agitation, anxiety, depression, insomnia, mood instability, and stress and increase knowledge and/or ability of: coping skills, healthy habits, self-management skills, and stress reduction.   Task: Recognize and Manage Caregiver Stress   Note:   Care Management Activities:    - active listening utilized - caregiver stress acknowledged - complementary therapy use encouraged - consideration of in-home help encouraged - decision-making supported - engagement with primary care provider encouraged - healthy lifestyle promoted - participation in support group encouraged - positive reinforcement provided - self-reflection promoted - social and community activities encouraged - spiritual activity use promoted - support group information provided - verbalization of feelings encouraged    Notes:       Follow Up Plan: SW will follow up with patient by phone over the next 60 days  Eula Fried, [place], MSW, [email or web address] Phone: [phone]

## 2020-06-06 ENCOUNTER — Encounter: Payer: Self-pay | Admitting: Family Medicine

## 2020-06-12 ENCOUNTER — Other Ambulatory Visit: Payer: Self-pay | Admitting: Unknown Physician Specialty

## 2020-07-14 ENCOUNTER — Telehealth: Payer: Self-pay

## 2020-08-28 ENCOUNTER — Telehealth: Payer: Self-pay

## 2020-09-01 ENCOUNTER — Other Ambulatory Visit: Payer: Self-pay | Admitting: Family Medicine

## 2020-09-01 NOTE — Telephone Encounter (Signed)
Called pt to schedule appt no answer left vm  

## 2020-09-01 NOTE — Telephone Encounter (Signed)
Requested medication (s) are due for refill today:yes  Requested medication (s) are on the active medication list:yes   Last refill: 05/31/2020  Future visit scheduled: no   Notes to clinic: overdue for 6 month follow up Message has been sent to patient to contact office    Requested Prescriptions  Pending Prescriptions Disp Refills   lisinopril (ZESTRIL) 40 MG tablet [Pharmacy Med Name: LISINOPRIL 40 MG TABLET] 90 tablet 0    Sig: TAKE 1 TABLET BY MOUTH EVERY DAY      Cardiovascular:  ACE Inhibitors Failed - 09/01/2020  1:26 AM      Failed - Cr in normal range and within 180 days    Creatinine  Date Value Ref Range Status  11/15/2013 0.90 0.60 - 1.30 mg/dL Final   Creatinine, Ser  Date Value Ref Range Status  10/05/2019 1.14 (H) 0.57 - 1.00 mg/dL Final          Failed - K in normal range and within 180 days    Potassium  Date Value Ref Range Status  10/05/2019 4.2 3.5 - 5.2 mmol/L Final  11/15/2013 3.3 (L) 3.5 - 5.1 mmol/L Final          Failed - Last BP in normal range    BP Readings from Last 1 Encounters:  12/23/19 (!) 145/83          Failed - Valid encounter within last 6 months    Recent Outpatient Visits           8 months ago Depression, major, single episode, severe (HCC)   [facility] Gabriel Cirri, NP   8 months ago Insomnia, unspecified type   [facility] Gabriel Cirri, NP   11 months ago Essential hypertension   [facility] Particia Nearing, [place]   1 year ago Right leg pain   [facility] [place], Corrie Dandy T, NP   1 year ago Right leg pain   [facility] Valentino Nose, NP                Passed - Patient is not pregnant

## 2020-09-06 ENCOUNTER — Other Ambulatory Visit: Payer: Self-pay | Admitting: Family Medicine

## 2020-09-06 ENCOUNTER — Other Ambulatory Visit: Payer: Self-pay | Admitting: Nurse Practitioner

## 2020-09-06 NOTE — Telephone Encounter (Signed)
Notes to clinic:  appt on 08/28/2020 canceled by provider  Review for refills   Requested Prescriptions  Pending Prescriptions Disp Refills   lisinopril (ZESTRIL) 40 MG tablet 90 tablet 0    Sig: Take 1 tablet (40 mg total) by mouth daily.      Cardiovascular:  ACE Inhibitors Failed - 09/06/2020  9:52 AM      Failed - Cr in normal range and within 180 days    Creatinine  Date Value Ref Range Status  11/15/2013 0.90 0.60 - 1.30 mg/dL Final   Creatinine, Ser  Date Value Ref Range Status  10/05/2019 1.14 (H) 0.57 - 1.00 mg/dL Final          Failed - K in normal range and within 180 days    Potassium  Date Value Ref Range Status  10/05/2019 4.2 3.5 - 5.2 mmol/L Final  11/15/2013 3.3 (L) 3.5 - 5.1 mmol/L Final          Failed - Last BP in normal range    BP Readings from Last 1 Encounters:  12/23/19 (!) 145/83          Failed - Valid encounter within last 6 months    Recent Outpatient Visits           8 months ago Depression, major, single episode, severe (HCC)   [facility] Gabriel Cirri, NP   8 months ago Insomnia, unspecified type   [facility] Gabriel Cirri, NP   11 months ago Essential hypertension   [facility] [place], Salley Hews, [place]   1 year ago Right leg pain   [facility] [place], [place] T, NP   1 year ago Right leg pain   [facility] Valentino Nose, NP                Passed - Patient is not pregnant        atorvastatin (LIPITOR) 10 MG tablet 30 tablet 0    Sig: Take 1 tablet (10 mg total) by mouth daily.      Cardiovascular:  Antilipid - Statins Passed - 09/06/2020  9:52 AM      Passed - Total Cholesterol in normal range and within 360 days    Cholesterol, Total  Date Value Ref Range Status  10/05/2019 157 100 - 199 mg/dL Final   Cholesterol Piccolo, Waived  Date Value Ref Range Status  10/03/2014 144 <200 mg/dL Final    Comment:                             Desirable                <200                         Borderline High      200- 239                         High                     >239           Passed - LDL in normal range and within 360 days    LDL Chol Calc (NIH)  Date Value Ref Range Status  10/05/2019 62 0 - 99 mg/dL Final          Passed -  HDL in normal range and within 360 days    HDL  Date Value Ref Range Status  10/05/2019 71 >39 mg/dL Final          Passed - Triglycerides in normal range and within 360 days    Triglycerides  Date Value Ref Range Status  10/05/2019 145 0 - 149 mg/dL Final   Triglycerides Piccolo,Waived  Date Value Ref Range Status  10/03/2014 73 <150 mg/dL Final    Comment:                            Normal                   <150                         Borderline High     150 - 199                         High                200 - 499                         Very High                >499           Passed - Patient is not pregnant      Passed - Valid encounter within last 12 months    Recent Outpatient Visits           8 months ago Depression, major, single episode, severe (HCC)   [facility] Gabriel Cirri, NP   8 months ago Insomnia, unspecified type   [facility] Gabriel Cirri, NP   11 months ago Essential hypertension   [facility] Particia Nearing, [place]   1 year ago Right leg pain   [facility] [place], Corrie Dandy T, NP   1 year ago Right leg pain   [facility] Valentino Nose, NP

## 2020-09-06 NOTE — Telephone Encounter (Signed)
Medication Refill - Medication: atorvastatin (LIPITOR) 10 MG tablet ,lisinopril (ZESTRIL) 40 MG tablet, meloxicam (MOBIC) 15 MG tablet   Has the patient contacted their pharmacy? Yes.   ((Agent: If yes, when and what did the pharmacy advise?) contact pcp  Preferred Pharmacy (with phone number or street name):  [id number] - [place], [place] [address] Phone:  [phone]  Fax:  [phone]      Agent: Please be advised that RX refills may take up to 3 business days. We ask that you follow-up with your pharmacy.

## 2020-09-06 NOTE — Telephone Encounter (Signed)
Notes to clinic: Patient given courtesy refills  Appt has not been scheduled    Requested Prescriptions  Pending Prescriptions Disp Refills   meloxicam (MOBIC) 15 MG tablet [Pharmacy Med Name: MELOXICAM 15 MG TABLET] 30 tablet 0    Sig: TAKE 1 TABLET BY MOUTH DAILY AS NEEDED.      Analgesics:  COX2 Inhibitors Failed - 09/06/2020  9:39 AM      Failed - HGB in normal range and within 360 days    Hemoglobin  Date Value Ref Range Status  01/13/2018 12.8 11.1 - 15.9 g/dL Final          Failed - Cr in normal range and within 360 days    Creatinine  Date Value Ref Range Status  11/15/2013 0.90 0.60 - 1.30 mg/dL Final   Creatinine, Ser  Date Value Ref Range Status  10/05/2019 1.14 (H) 0.57 - 1.00 mg/dL Final          Passed - Patient is not pregnant      Passed - Valid encounter within last 12 months    Recent Outpatient Visits           8 months ago Depression, major, single episode, severe (HCC)   [facility] Gabriel Cirri, NP   8 months ago Insomnia, unspecified type   [facility] Gabriel Cirri, NP   11 months ago Essential hypertension   [facility] [place], [place], [place]   1 year ago Right leg pain   [facility] [place], Corrie Dandy T, NP   1 year ago Right leg pain   [facility] Cathlean Marseilles A, NP                  hydrochlorothiazide (HYDRODIURIL) 25 MG tablet [Pharmacy Med Name: HYDROCHLOROTHIAZIDE 25 MG TAB] 90 tablet 0    Sig: TAKE 1 TABLET BY MOUTH EVERY DAY      Cardiovascular: Diuretics - Thiazide Failed - 09/06/2020  9:39 AM      Failed - Cr in normal range and within 360 days    Creatinine  Date Value Ref Range Status  11/15/2013 0.90 0.60 - 1.30 mg/dL Final   Creatinine, Ser  Date Value Ref Range Status  10/05/2019 1.14 (H) 0.57 - 1.00 mg/dL Final          Failed - Last BP in normal range    BP Readings from Last 1 Encounters:  12/23/19 (!) 145/83           Failed - Valid encounter within last 6 months    Recent Outpatient Visits           8 months ago Depression, major, single episode, severe (HCC)   [facility] Gabriel Cirri, NP   8 months ago Insomnia, unspecified type   [facility] Gabriel Cirri, NP   11 months ago Essential hypertension   [facility] [place], Salley [place], [place]   1 year ago Right leg pain   [facility] [place], Corrie Dandy T, NP   1 year ago Right leg pain   [facility] Valentino Nose, NP                Passed - Ca in normal range and within 360 days    Calcium  Date Value Ref Range Status  10/05/2019 9.4 8.7 - 10.3 mg/dL Final   Calcium, Total  Date Value Ref Range Status  11/15/2013 8.7 8.5 - 10.1 mg/dL  Final          Passed - K in normal range and within 360 days    Potassium  Date Value Ref Range Status  10/05/2019 4.2 3.5 - 5.2 mmol/L Final  11/15/2013 3.3 (L) 3.5 - 5.1 mmol/L Final          Passed - Na in normal range and within 360 days    Sodium  Date Value Ref Range Status  10/05/2019 140 134 - 144 mmol/L Final  11/15/2013 140 136 - 145 mmol/L Final

## 2020-09-07 MED ORDER — LISINOPRIL 40 MG PO TABS: 40.0000 mg | ORAL_TABLET | Freq: Every day | ORAL | 0 refills | Status: DC

## 2020-09-07 MED ORDER — ATORVASTATIN CALCIUM 10 MG PO TABS: 1.0000 | ORAL_TABLET | Freq: Every day | ORAL | 0 refills | Status: DC

## 2020-09-07 NOTE — Telephone Encounter (Signed)
Pt has apt on 09/21/2020 asking for medication to bridge over.

## 2020-09-07 NOTE — Telephone Encounter (Signed)
Fyi Pt has apt on 09/21/2020 asking for medication to bridge over.

## 2020-09-07 NOTE — Telephone Encounter (Signed)
Pt scheduled for over due physical on 09/21/2020 asking if medication can be sent to bridge over

## 2020-09-20 NOTE — Progress Notes (Deleted)
LMP  (LMP Unknown)    Subjective:    Patient ID: Ashley Mcknight, female    DOB: 12/22/1950, 70 y.o.   MRN: [id number]  HPI: Ashley Mcknight is a 70 y.o. female presenting on 09/21/2020 for comprehensive medical examination. Current medical complaints include:{Blank single:19197::"none","***"}  She currently lives with: Menopausal Symptoms: {Blank single:19197::"yes","no"} HYPERTENSION / HYPERLIPIDEMIA Satisfied with current treatment? {Blank single:19197::"yes","no"} Duration of hypertension: {Blank single:19197::"chronic","months","years"} BP monitoring frequency: {Blank single:19197::"not checking","rarely","daily","weekly","monthly","a few times a day","a few times a week","a few times a month"} BP range:  BP medication side effects: {Blank single:19197::"yes","no"} Past BP meds: {Blank multiple:19196::"none","amlodipine","amlodipine/benazepril","atenolol","benazepril","benazepril/HCTZ","bisoprolol (bystolic)","carvedilol","chlorthalidone","clonidine","diltiazem","exforge HCT","HCTZ","irbesartan (avapro)","labetalol","lisinopril","lisinopril-HCTZ","losartan (cozaar)","methyldopa","nifedipine","olmesartan (benicar)","olmesartan-HCTZ","quinapril","ramipril","spironalactone","tekturna","valsartan","valsartan-HCTZ","verapamil"} Duration of hyperlipidemia: {Blank single:19197::"chronic","months","years"} Cholesterol medication side effects: {Blank single:19197::"yes","no"} Cholesterol supplements: {Blank multiple:19196::"none","fish oil","niacin","red yeast rice"} Past cholesterol medications: {Blank multiple:19196::"none","atorvastain (lipitor)","lovastatin (mevacor)","pravastatin (pravachol)","rosuvastatin (crestor)","simvastatin (zocor)","vytorin","fenofibrate (tricor)","gemfibrozil","ezetimide (zetia)","niaspan","lovaza"} Medication compliance: {Blank single:19197::"excellent compliance","good compliance","fair compliance","poor compliance"} Aspirin: {Blank  single:19197::"yes","no"} Recent stressors: {Blank single:19197::"yes","no"} Recurrent headaches: {Blank single:19197::"yes","no"} Visual changes: {Blank single:19197::"yes","no"} Palpitations: {Blank single:19197::"yes","no"} Dyspnea: {Blank single:19197::"yes","no"} Chest pain: {Blank single:19197::"yes","no"} Lower extremity edema: {Blank single:19197::"yes","no"} Dizzy/lightheaded: {Blank single:19197::"yes","no"}  DEPRESSION  CHRONIC KIDNEY DISEASE CKD status: {Blank single:19197::"controlled","uncontrolled","better","worse","exacerbated","stable"} Medications renally dose: {Blank single:19197::"yes","no"} Previous renal evaluation: {Blank single:19197::"yes","no"} Pneumovax:  {Blank single:19197::"Up to Date","Not up to Date","unknown"} Influenza Vaccine:  {Blank single:19197::"Up to Date","Not up to Date","unknown"}  Depression Screen done today and results listed below:  Depression screen [facility] 2/9 12/23/2019 12/16/2019 01/13/2018 02/14/2017 04/28/2015  Decreased Interest 0 3 0 0 0  Down, Depressed, Hopeless 3 3 0 0 0  PHQ - 2 Score 3 6 0 0 0  Altered sleeping 3 3 0 0 -  Tired, decreased energy 3 3 0 0 -  Change in appetite 1 3 0 0 -  Feeling bad or failure about yourself  0 0 0 0 -  Trouble concentrating 0 3 0 0 -  Moving slowly or fidgety/restless 1 3 0 0 -  Suicidal thoughts 0 0 0 0 -  PHQ-9 Score 11 21 0 0 -  Difficult doing work/chores Not difficult at all Not difficult at all - Not difficult at all -    The patient {has/does not MCNO:70962} a history of falls. I {did/did not:19850} complete a risk assessment for falls. A plan of care for falls {was/was not:19852} documented.   Past Medical History:  Past Medical History:  Diagnosis Date   Depression    Diabetes mellitus without complication (HCC)    Fatigue    Gout    Hyperlipidemia    Hypertension     Surgical History:  Past Surgical History:  Procedure Laterality Date   ABDOMINAL HYSTERECTOMY      APPENDECTOMY  2011   CARPAL TUNNEL RELEASE Bilateral    CHOLECYSTECTOMY     KNEE SURGERY Left    ROTATOR CUFF REPAIR     toenail removal      Medications:  Current Outpatient Medications on File Prior to Visit  Medication Sig   aspirin 81 MG tablet Take 81 mg by mouth daily.   atorvastatin (LIPITOR) 10 MG tablet Take 1 tablet (10 mg total) by mouth daily.   hydrochlorothiazide (HYDRODIURIL) 25 MG tablet TAKE 1 TABLET BY MOUTH EVERY DAY   lisinopril (ZESTRIL) 40 MG tablet TAKE 1 TABLET BY MOUTH EVERY DAY   meloxicam (MOBIC) 15 MG tablet TAKE 1 TABLET BY MOUTH DAILY AS NEEDED.   tiZANidine (ZANAFLEX) 4 MG tablet Take 4 mg by mouth every 6 (six) hours as needed for muscle spasms.   traZODone (DESYREL) 50 MG tablet  Take 0.5-1 tablets (25-50 mg total) by mouth at bedtime as needed for sleep.   No current facility-administered medications on file prior to visit.    Allergies:  No Known Allergies  Social History:  Social History   Socioeconomic History   Marital status: Married    Spouse name: Not on file   Number of children: Not on file   Years of education: Not on file   Highest education level: Not on file  Occupational History   Not on file  Tobacco Use   Smoking status: Never   Smokeless tobacco: Never  Vaping Use   Vaping Use: Never used  Substance and Sexual Activity   Alcohol use: No    Alcohol/week: 0.0 standard drinks   Drug use: No   Sexual activity: Yes  Other Topics Concern   Not on file  Social History Narrative   Not on file   Social Determinants of Health   Financial Resource Strain: Not on file  Food Insecurity: Not on file  Transportation Needs: Not on file  Physical Activity: Not on file  Stress: Not on file  Social Connections: Not on file  Intimate Partner Violence: Not on file   Social History   Tobacco Use  Smoking Status Never  Smokeless Tobacco Never   Social History   Substance and Sexual Activity  Alcohol Use No    Alcohol/week: 0.0 standard drinks    Family History:  Family History  Problem Relation Age of Onset   Hypertension Mother    Cancer Father        throat   Cancer Brother     Past medical history, surgical history, medications, allergies, family history and social history reviewed with patient today and changes made to appropriate areas of the chart.   ROS All other ROS negative except what is listed above and in the HPI.      Objective:    LMP  (LMP Unknown)   Wt Readings from Last 3 Encounters:  12/23/19 215 lb (97.5 kg)  12/16/19 213 lb 9.6 oz (96.9 kg)  10/05/19 215 lb (97.5 kg)    Physical Exam  Results for orders placed or performed in visit on 10/05/19  Comprehensive metabolic panel  Result Value Ref Range   Glucose 79 65 - 99 mg/dL   BUN 18 8 - 27 mg/dL   Creatinine, Ser 2.63 (H) 0.57 - 1.00 mg/dL   GFR calc non Af Amer 50 (L) >59 mL/min/1.73   GFR calc Af Amer 57 (L) >59 mL/min/1.73   BUN/Creatinine Ratio 16 12 - 28   Sodium 140 134 - 144 mmol/L   Potassium 4.2 3.5 - 5.2 mmol/L   Chloride 102 96 - 106 mmol/L   CO2 24 20 - 29 mmol/L   Calcium 9.4 8.7 - 10.3 mg/dL   Total Protein 7.2 6.0 - 8.5 g/dL   Albumin 4.3 3.8 - 4.8 g/dL   Globulin, Total 2.9 1.5 - 4.5 g/dL   Albumin/Globulin Ratio 1.5 1.2 - 2.2   Bilirubin Total 0.3 0.0 - 1.2 mg/dL   Alkaline Phosphatase 75 48 - 121 IU/L   AST 10 0 - 40 IU/L   ALT 9 0 - 32 IU/L  Lipid Panel w/o Chol/HDL Ratio  Result Value Ref Range   Cholesterol, Total 157 100 - 199 mg/dL   Triglycerides 785 0 - 149 mg/dL   HDL 71 >88 mg/dL   VLDL Cholesterol Cal 24 5 - 40 mg/dL   LDL Chol Calc (  NIH) 62 0 - 99 mg/dL  HgB S2G  Result Value Ref Range   Hgb A1c MFr Bld 6.0 (H) 4.8 - 5.6 %   Est. average glucose Bld gHb Est-mCnc 126 mg/dL      Assessment & Plan:   Problem List Items Addressed This Visit       Cardiovascular and Mediastinum   Hypertension - Primary     Endocrine   Impaired fasting glucose      Genitourinary   CKD (chronic kidney disease) stage 3, GFR 30-59 ml/min (HCC)     Other   Hyperlipemia   Depression, major, single episode, severe (HCC)     Follow up plan: No follow-ups on file.   LABORATORY TESTING:  - Pap smear: {Blank single:19197::"pap done","not applicable","up to date","done elsewhere"}  IMMUNIZATIONS:   - Tdap: Tetanus vaccination status reviewed: last tetanus booster within 10 years. - Influenza: Postponed to flu season - Pneumovax: {Blank single:19197::"Up to date","Administered today","Not applicable","Refused","Given elsewhere"} - Prevnar: {Blank single:19197::"Up to date","Administered today","Not applicable","Refused","Given elsewhere"} - HPV: Not applicable - Zostavax vaccine: {Blank single:19197::"Up to date","Administered today","Not applicable","Refused","Given elsewhere"}  SCREENING: -Mammogram: {Blank single:19197::"Up to date","Ordered today","Not applicable","Refused","Done elsewhere"}  - Colonoscopy: {Blank single:19197::"Up to date","Ordered today","Not applicable","Refused","Done elsewhere"}  - Bone Density: {Blank single:19197::"Up to date","Ordered today","Not applicable","Refused","Done elsewhere"}  -Hearing Test: Not applicable  -Spirometry: Not applicable   PATIENT COUNSELING:   Advised to take 1 mg of folate supplement per day if capable of pregnancy.   Sexuality: Discussed sexually transmitted diseases, partner selection, use of condoms, avoidance of unintended pregnancy  and contraceptive alternatives.   Advised to avoid cigarette smoking.  I discussed with the patient that most people either abstain from alcohol or drink within safe limits (<=14/week and <=4 drinks/occasion for males, <=7/weeks and <= 3 drinks/occasion for females) and that the risk for alcohol disorders and other health effects rises proportionally with the number of drinks per week and how often a drinker exceeds daily limits.  Discussed cessation/primary  prevention of drug use and availability of treatment for abuse.   Diet: Encouraged to adjust caloric intake to maintain  or achieve ideal body weight, to reduce intake of dietary saturated fat and total fat, to limit sodium intake by avoiding high sodium foods and not adding table salt, and to maintain adequate dietary potassium and calcium preferably from fresh fruits, vegetables, and low-fat dairy products.    stressed the importance of regular exercise  Injury prevention: Discussed safety belts, safety helmets, smoke detector, smoking near bedding or upholstery.   Dental health: Discussed importance of regular tooth brushing, flossing, and dental visits.    NEXT PREVENTATIVE PHYSICAL DUE IN 1 YEAR. No follow-ups on file.

## 2020-09-21 ENCOUNTER — Encounter: Payer: Self-pay | Admitting: Nurse Practitioner

## 2020-09-21 ENCOUNTER — Ambulatory Visit: Payer: Medicaid Other | Admitting: Licensed Clinical Social Worker

## 2020-09-21 ENCOUNTER — Other Ambulatory Visit: Payer: Self-pay

## 2020-09-21 DIAGNOSIS — G4700 Insomnia, unspecified: Secondary | ICD-10-CM

## 2020-09-21 DIAGNOSIS — F322 Major depressive disorder, single episode, severe without psychotic features: Secondary | ICD-10-CM

## 2020-09-21 DIAGNOSIS — I10 Essential (primary) hypertension: Secondary | ICD-10-CM

## 2020-09-25 NOTE — Chronic Care Management (AMB) (Signed)
    Clinical Social Work  Chronic Care Management   Phone Outreach    09/25/2020 Name: [name] MRN: [id number] DOB: 08-05-1950  [name] is a 70 y.o. year old female who is a primary care patient of Larae Grooms, NP .   CCM LCSW reached out to patient today by phone to introduce self, assess needs and offer Care Management services and interventions.    Plan:Appointment was scheduled with CCM LCSW for 09/28/20  Review of patient status, including review of consultants reports, relevant laboratory and other test results, and collaboration with appropriate care team members and the patient's provider was performed as part of comprehensive patient evaluation and provision of care management services.    Jenel Lucks, MSW, LCSW [facility] Care Management [place]  [facility] [email or web address] Phone [phone] 4:31 PM

## 2020-09-28 ENCOUNTER — Ambulatory Visit (INDEPENDENT_AMBULATORY_CARE_PROVIDER_SITE_OTHER): Payer: Medicaid Other | Admitting: Licensed Clinical Social Worker

## 2020-09-28 DIAGNOSIS — F322 Major depressive disorder, single episode, severe without psychotic features: Secondary | ICD-10-CM | POA: Diagnosis not present

## 2020-09-28 DIAGNOSIS — I10 Essential (primary) hypertension: Secondary | ICD-10-CM | POA: Diagnosis not present

## 2020-09-28 NOTE — Chronic Care Management (AMB) (Signed)
    Clinical Social Work  Chronic Care Management   Phone Outreach    09/28/2020 Name: Ashley Mcknight MRN: [id number] DOB: 11/08/1950  Ashley Mcknight is a 70 y.o. year old female who is a primary care patient of Larae Grooms, NP .   CCM LCSW reached out to patient today by phone to introduce self, assess needs and offer Care Management services and interventions.  Patient shared that she got off work this morning and was asleep. She is unable to keep phone appointment today and requested to reschedule.  Plan:Appointment was rescheduled with CCM LCSW for  10/05/20  Review of patient status, including review of consultants reports, relevant laboratory and other test results, and collaboration with appropriate care team members and the patient's provider was performed as part of comprehensive patient evaluation and provision of care management services.    Ashley Mcknight, MSW, LCSW [facility] Care [facility] [place]  [facility] [email or web address] Phone [phone] 10:42 AM

## 2020-10-05 ENCOUNTER — Telehealth: Payer: Self-pay | Admitting: Licensed Clinical Social Worker

## 2020-10-05 ENCOUNTER — Telehealth: Payer: Medicaid Other

## 2020-10-05 NOTE — Telephone Encounter (Signed)
    Clinical Social Work  Care Management   Phone Outreach    10/05/2020 Name: Ashley Mcknight MRN: [id number] DOB: July 08, 1950  Ashley Mcknight is a 70 y.o. year old female who is a primary care patient of Larae Grooms, NP .   F/U phone call today to assess needs, progress and barriers with care plan goals.   Telephone outreach was unsuccessful A HIPPA compliant phone message was left for the patient providing contact information and requesting a return call.   Plan:CCM LCSW will wait for return call. If no return call is received, Will reach out to patient again in the next 30 days .   Review of patient status, including review of consultants reports, relevant laboratory and other test results, and collaboration with appropriate care team members and the patient's provider was performed as part of comprehensive patient evaluation and provision of care management services.    Jenel Lucks, MSW, LCSW [facility] Care Management [place]  [facility] [email or web address] Phone [phone] 4:42 PM

## 2020-11-04 ENCOUNTER — Other Ambulatory Visit: Payer: Self-pay | Admitting: Nurse Practitioner

## 2020-11-04 NOTE — Telephone Encounter (Signed)
Requested medication (s) are due for refill today: yes  Requested medication (s) are on the active medication list: yes  Last refill:  09/07/20 #30  Future visit scheduled: no  Notes to clinic:  overdue labs   Requested Prescriptions  Pending Prescriptions Disp Refills   meloxicam (MOBIC) 15 MG tablet [Pharmacy Med Name: MELOXICAM 15 MG TABLET] 30 tablet 0    Sig: TAKE 1 TABLET BY MOUTH EVERY DAY AS NEEDED     Analgesics:  COX2 Inhibitors Failed - 11/04/2020  2:28 PM      Failed - HGB in normal range and within 360 days    Hemoglobin  Date Value Ref Range Status  01/13/2018 12.8 11.1 - 15.9 g/dL Final          Failed - Cr in normal range and within 360 days    Creatinine  Date Value Ref Range Status  11/15/2013 0.90 0.60 - 1.30 mg/dL Final   Creatinine, Ser  Date Value Ref Range Status  10/05/2019 1.14 (H) 0.57 - 1.00 mg/dL Final          Passed - Patient is not pregnant      Passed - Valid encounter within last 12 months    Recent Outpatient Visits           10 months ago Depression, major, single episode, severe (HCC)   [facility] Gabriel Cirri, NP   10 months ago Insomnia, unspecified type   [facility] Gabriel Cirri, NP   1 year ago Essential hypertension   [facility] Ro[place]velt Maser [place], [place]   1 year ago Right leg pain   [facility] [place], Corrie Dandy T, NP   1 year ago Right leg pain   [facility] Valentino Nose, NP

## 2020-11-06 ENCOUNTER — Other Ambulatory Visit: Payer: Self-pay | Admitting: Nurse Practitioner

## 2020-11-06 NOTE — Telephone Encounter (Signed)
HCTZ is not due but the meloxicam is.

## 2020-11-06 NOTE — Telephone Encounter (Signed)
Notes to clinic:  Patient has scheduled follow up for 11/21/2020 Review for refill  until then  Patient states that she will be self pay    Requested Prescriptions  Pending Prescriptions Disp Refills   meloxicam (MOBIC) 15 MG tablet [Pharmacy Med Name: MELOXICAM 15 MG TABLET] 30 tablet 0    Sig: TAKE 1 TABLET BY MOUTH EVERY DAY AS NEEDED     Analgesics:  COX2 Inhibitors Failed - 11/06/2020  2:46 PM      Failed - HGB in normal range and within 360 days    Hemoglobin  Date Value Ref Range Status  01/13/2018 12.8 11.1 - 15.9 g/dL Final          Failed - Cr in normal range and within 360 days    Creatinine  Date Value Ref Range Status  11/15/2013 0.90 0.60 - 1.30 mg/dL Final   Creatinine, Ser  Date Value Ref Range Status  10/05/2019 1.14 (H) 0.57 - 1.00 mg/dL Final          Passed - Patient is not pregnant      Passed - Valid encounter within last 12 months    Recent Outpatient Visits           10 months ago Depression, major, single episode, severe (HCC)   [facility] Gabriel Cirri, NP   10 months ago Insomnia, unspecified type   [facility] Gabriel Cirri, NP   1 year ago Essential hypertension   [facility] Roosvelt Maser [place], [place]   1 year ago Right leg pain   [facility] [place], Corrie Dandy T, NP   1 year ago Right leg pain   [facility] Valentino Nose, NP       Future Appointments             In 2 weeks Larae Grooms, NP [facility]             hydrochlorothiazide (HYDRODIURIL) 25 MG tablet [Pharmacy Med Name: HYDROCHLOROTHIAZIDE 25 MG TAB] 90 tablet 0    Sig: TAKE 1 TABLET BY MOUTH EVERY DAY     Cardiovascular: Diuretics - Thiazide Failed - 11/06/2020  2:46 PM      Failed - Ca in normal range and within 360 days    Calcium  Date Value Ref Range Status  10/05/2019 9.4 8.7 - 10.3 mg/dL Final   Calcium, Total  Date Value Ref Range Status  11/15/2013 8.7  8.5 - 10.1 mg/dL Final          Failed - Cr in normal range and within 360 days    Creatinine  Date Value Ref Range Status  11/15/2013 0.90 0.60 - 1.30 mg/dL Final   Creatinine, Ser  Date Value Ref Range Status  10/05/2019 1.14 (H) 0.57 - 1.00 mg/dL Final          Failed - K in normal range and within 360 days    Potassium  Date Value Ref Range Status  10/05/2019 4.2 3.5 - 5.2 mmol/L Final  11/15/2013 3.3 (L) 3.5 - 5.1 mmol/L Final          Failed - Na in normal range and within 360 days    Sodium  Date Value Ref Range Status  10/05/2019 140 134 - 144 mmol/L Final  11/15/2013 140 136 - 145 mmol/L Final          Failed - Last BP in normal range    BP Readings from  Last 1 Encounters:  12/23/19 (!) 145/83          Failed - Valid encounter within last 6 months    Recent Outpatient Visits           10 months ago Depression, major, single episode, severe (HCC)   [facility] Gabriel Cirri, NP   10 months ago Insomnia, unspecified type   [facility] Gabriel Cirri, NP   1 year ago Essential hypertension   [facility] Particia [place], [place]   1 year ago Right leg pain   [facility] [place], Corrie Dandy T, NP   1 year ago Right leg pain   [facility] Valentino Nose, NP       Future Appointments             In 2 weeks Larae Grooms, NP [facility]

## 2020-11-21 ENCOUNTER — Ambulatory Visit: Payer: Self-pay | Admitting: *Deleted

## 2020-11-21 ENCOUNTER — Ambulatory Visit: Payer: Medicaid Other | Admitting: Nurse Practitioner

## 2020-11-21 NOTE — Telephone Encounter (Signed)
Routing to provider to advise on nurse triage's message.

## 2020-11-21 NOTE — Telephone Encounter (Signed)
Thank you for the ypdate.  She has an appointment this week that we need to push back for her follow up.  However, if she wants to do a virtual to discuss her symptoms I am happy to do that.

## 2020-11-21 NOTE — Telephone Encounter (Signed)
Patient called to review symptoms of covid . Symptoms started Wednesday 11/15/20  fever, headache, body aches, runny nose , sore throat, cough . Tested with at home covid test on Friday 11/17/20 and result was positive. Feeling better now but continues to have productive cough, coughing up  clear phelgm. Patient requesting recommendation to treat cough. Patient asking if appt needed before scheduling . Isolation precautions reviewed with patient. Care advise given. Patient verbalized understanding of care advise and to call back or go to [facility] or ED if symptoms worsen.

## 2020-11-21 NOTE — Telephone Encounter (Signed)
Pt called and stated that she tested positive for covid and would like some advise       Reason for Disposition  [1] HIGH RISK for severe COVID complications (e.g., weak immune system, age > 64 years, obesity with BMI > 25, pregnant, chronic lung disease or other chronic medical condition) AND [2] COVID symptoms (e.g., cough, fever)  (Exceptions: Already seen by PCP and no new or worsening symptoms.)  Answer Assessment - Initial Assessment Questions 1. COVID-19 DIAGNOSIS: "Who made your COVID-19 diagnosis?" "Was it confirmed by a positive lab test or self-test?" If not diagnosed by a doctor (or NP/PA), ask "Are there lots of cases (community spread) where you live?" Note: See public health department website, if unsure.     At home test positive for covid Friday 11/17/20 2. COVID-19 EXPOSURE: "Was there any known exposure to COVID before the symptoms began?" CDC Definition of close contact: within 6 feet (2 meters) for a total of 15 minutes or more over a 24-hour period.      Goes to work , grocery store  3. ONSET: "When did the COVID-19 symptoms start?"      Wednesday 11/15/20 4. WORST SYMPTOM: "What is your worst symptom?" (e.g., cough, fever, shortness of breath, muscle aches)     Headache, body aches, fever, runny nose, cough,  5. COUGH: "Do you have a cough?" If Yes, ask: "How bad is the cough?"       Yes , coughing up phlegm clear  6. FEVER: "Do you have a fever?" If Yes, ask: "What is your temperature, how was it measured, and when did it start?"     no 7. RESPIRATORY STATUS: "Describe your breathing?" (e.g., shortness of breath, wheezing, unable to speak)      Denies  8. BETTER-SAME-WORSE: "Are you getting better, staying the same or getting worse compared to yesterday?"  If getting worse, ask, "In what way?"     bettter 9. HIGH RISK DISEASE: "Do you have any chronic medical problems?" (e.g., asthma, heart or lung disease, weak immune system, obesity, etc.)     no 10. VACCINE: "Have  you had the COVID-19 vaccine?" If Yes, ask: "Which one, how many shots, when did you get it?"       X2 moderna  11. BOOSTER: "Have you received your COVID-19 booster?" If Yes, ask: "Which one and when did you get it?"       X1 Moderna  12. PREGNANCY: "Is there any chance you are pregnant?" "When was your last menstrual period?"       na 13. OTHER SYMPTOMS: "Do you have any other symptoms?"  (e.g., chills, fatigue, headache, loss of smell or taste, muscle pain, sore throat)       Symptoms better now , coughing up phelgm  14. O2 SATURATION MONITOR:  "Do you use an oxygen saturation monitor (pulse oximeter) at home?" If Yes, ask "What is your reading (oxygen level) today?" "What is your usual oxygen saturation reading?" (e.g., 95%)       na  Protocols used: Coronavirus (COVID-19) Diagnosed or Suspected-A-AH

## 2020-11-21 NOTE — Progress Notes (Deleted)
LMP  (LMP Unknown)    Subjective:    Patient ID: Ashley Mcknight, female    DOB: Mar 29, 1950, 70 y.o.   MRN: [id number]  HPI: Ashley Mcknight is a 70 y.o. female  No chief complaint on file.  HYPERTENSION / HYPERLIPIDEMIA Satisfied with current treatment? {Blank single:19197::"yes","no"} Duration of hypertension: {Blank single:19197::"chronic","months","years"} BP monitoring frequency: {Blank single:19197::"not checking","rarely","daily","weekly","monthly","a few times a day","a few times a week","a few times a month"} BP range:  BP medication side effects: {Blank single:19197::"yes","no"} Past BP meds: {Blank multiple:19196::"none","amlodipine","amlodipine/benazepril","atenolol","benazepril","benazepril/HCTZ","bisoprolol (bystolic)","carvedilol","chlorthalidone","clonidine","diltiazem","exforge HCT","HCTZ","irbesartan (avapro)","labetalol","lisinopril","lisinopril-HCTZ","losartan (cozaar)","methyldopa","nifedipine","olmesartan (benicar)","olmesartan-HCTZ","quinapril","ramipril","spironalactone","tekturna","valsartan","valsartan-HCTZ","verapamil"} Duration of hyperlipidemia: {Blank single:19197::"chronic","months","years"} Cholesterol medication side effects: {Blank single:19197::"yes","no"} Cholesterol supplements: {Blank multiple:19196::"none","fish oil","niacin","red yeast rice"} Past cholesterol medications: {Blank multiple:19196::"none","atorvastain (lipitor)","lovastatin (mevacor)","pravastatin (pravachol)","rosuvastatin (crestor)","simvastatin (zocor)","vytorin","fenofibrate (tricor)","gemfibrozil","ezetimide (zetia)","niaspan","lovaza"} Medication compliance: {Blank single:19197::"excellent compliance","good compliance","fair compliance","poor compliance"} Aspirin: {Blank single:19197::"yes","no"} Recent stressors: {Blank single:19197::"yes","no"} Recurrent headaches: {Blank single:19197::"yes","no"} Visual changes: {Blank single:19197::"yes","no"} Palpitations: {Blank  single:19197::"yes","no"} Dyspnea: {Blank single:19197::"yes","no"} Chest pain: {Blank single:19197::"yes","no"} Lower extremity edema: {Blank single:19197::"yes","no"} Dizzy/lightheaded: {Blank single:19197::"yes","no"}  CHRONIC KIDNEY DISEASE CKD status: {Blank single:19197::"controlled","uncontrolled","better","worse","exacerbated","stable"} Medications renally dose: {Blank single:19197::"yes","no"} Previous renal evaluation: {Blank single:19197::"yes","no"} Pneumovax:  {Blank single:19197::"Up to Date","Not up to Date","unknown"} Influenza Vaccine:  {Blank single:19197::"Up to Date","Not up to Date","unknown"}  DEPRESSION Relevant past medical, surgical, family and social history reviewed and updated as indicated. Interim medical history since our last visit reviewed. Allergies and medications reviewed and updated.  Review of Systems  Per HPI unless specifically indicated above     Objective:    LMP  (LMP Unknown)   Wt Readings from Last 3 Encounters:  12/23/19 215 lb (97.5 kg)  12/16/19 213 lb 9.6 oz (96.9 kg)  10/05/19 215 lb (97.5 kg)    Physical Exam  Results for orders placed or performed in visit on 10/05/19  Comprehensive metabolic panel  Result Value Ref Range   Glucose 79 65 - 99 mg/dL   BUN 18 8 - 27 mg/dL   Creatinine, Ser 3.66 (H) 0.57 - 1.00 mg/dL   GFR calc non Af Amer 50 (L) >59 mL/min/1.73   GFR calc Af Amer 57 (L) >59 mL/min/1.73   BUN/Creatinine Ratio 16 12 - 28   Sodium 140 134 - 144 mmol/L   Potassium 4.2 3.5 - 5.2 mmol/L   Chloride 102 96 - 106 mmol/L   CO2 24 20 - 29 mmol/L   Calcium 9.4 8.7 - 10.3 mg/dL   Total Protein 7.2 6.0 - 8.5 g/dL   Albumin 4.3 3.8 - 4.8 g/dL   Globulin, Total 2.9 1.5 - 4.5 g/dL   Albumin/Globulin Ratio 1.5 1.2 - 2.2   Bilirubin Total 0.3 0.0 - 1.2 mg/dL   Alkaline Phosphatase 75 48 - 121 IU/L   AST 10 0 - 40 IU/L   ALT 9 0 - 32 IU/L  Lipid Panel w/o Chol/HDL Ratio  Result Value Ref Range   Cholesterol, Total  157 100 - 199 mg/dL   Triglycerides 440 0 - 149 mg/dL   HDL 71 >34 mg/dL   VLDL Cholesterol Cal 24 5 - 40 mg/dL   LDL Chol Calc (NIH) 62 0 - 99 mg/dL  HgB V4Q  Result Value Ref Range   Hgb A1c MFr Bld 6.0 (H) 4.8 - 5.6 %   Est. average glucose Bld gHb Est-mCnc 126 mg/dL      Assessment & Plan:   Problem List Items Addressed This Visit       Cardiovascular and Mediastinum   Hypertension - Primary     Endocrine   Impaired fasting glucose  Genitourinary   CKD (chronic kidney disease) stage 3, GFR 30-59 ml/min (HCC)     Other   Hyperlipemia   Depression, major, single episode, severe (HCC)     Follow up plan: No follow-ups on file.

## 2020-11-24 ENCOUNTER — Telehealth: Payer: Medicaid Other | Admitting: Nurse Practitioner

## 2020-11-24 NOTE — Progress Notes (Deleted)
   LMP  (LMP Unknown)    Subjective:    Patient ID: Ashley Mcknight, female    DOB: [birth year], 70 y.o.   MRN: [id number]  HPI: Ashley Mcknight is a 70 y.o. female  No chief complaint on file.  COVID POSITIVE  Relevant past medical, surgical, family and social history reviewed and updated as indicated. Interim medical history since our last visit reviewed. Allergies and medications reviewed and updated.  Review of Systems  Per HPI unless specifically indicated above     Objective:    LMP  (LMP Unknown)   Wt Readings from Last 3 Encounters:  12/23/19 215 lb (97.5 kg)  12/16/19 213 lb 9.6 oz (96.9 kg)  10/05/19 215 lb (97.5 kg)    Physical Exam  Results for orders placed or performed in visit on 10/05/19  Comprehensive metabolic panel  Result Value Ref Range   Glucose 79 65 - 99 mg/dL   BUN 18 8 - 27 mg/dL   Creatinine, Ser 2.99 (H) 0.57 - 1.00 mg/dL   GFR calc non Af Amer 50 (L) >59 mL/min/1.73   GFR calc Af Amer 57 (L) >59 mL/min/1.73   BUN/Creatinine Ratio 16 12 - 28   Sodium 140 134 - 144 mmol/L   Potassium 4.2 3.5 - 5.2 mmol/L   Chloride 102 96 - 106 mmol/L   CO2 24 20 - 29 mmol/L   Calcium 9.4 8.7 - 10.3 mg/dL   Total Protein 7.2 6.0 - 8.5 g/dL   Albumin 4.3 3.8 - 4.8 g/dL   Globulin, Total 2.9 1.5 - 4.5 g/dL   Albumin/Globulin Ratio 1.5 1.2 - 2.2   Bilirubin Total 0.3 0.0 - 1.2 mg/dL   Alkaline Phosphatase 75 48 - 121 IU/L   AST 10 0 - 40 IU/L   ALT 9 0 - 32 IU/L  Lipid Panel w/o Chol/HDL Ratio  Result Value Ref Range   Cholesterol, Total 157 100 - 199 mg/dL   Triglycerides 371 0 - 149 mg/dL   HDL 71 >69 mg/dL   VLDL Cholesterol Cal 24 5 - 40 mg/dL   LDL Chol Calc (NIH) 62 0 - 99 mg/dL  HgB C7E  Result Value Ref Range   Hgb A1c MFr Bld 6.0 (H) 4.8 - 5.6 %   Est. average glucose Bld gHb Est-mCnc 126 mg/dL      Assessment & Plan:   Problem List Items Addressed This Visit   None    Follow up plan: No follow-ups on file.   This visit was  completed via MyChart due to the restrictions of the COVID-19 pandemic. All issues as above were discussed and addressed. Physical exam was done as above through visual confirmation on MyChart. If it was felt that the patient should be evaluated in the office, they were directed there. The patient verbally consented to this visit. Location of the patient: *** Location of the provider: Office Those involved with this call:  Provider: Larae Grooms, NP CMA: *** Front Desk/Registration: *** This encounter was conducted via ***.  I spent *** dedicated to the care of this patient on the date of this encounter to include previsit review of ***, face to face time with the patient, and post visit ordering of testing.

## 2020-11-27 ENCOUNTER — Telehealth: Payer: Medicaid Other | Admitting: Nurse Practitioner

## 2020-11-27 NOTE — Progress Notes (Deleted)
   LMP  (LMP Unknown)    Subjective:    Patient ID: Ashley Mcknight, female    DOB: 04-07-1950, 70 y.o.   MRN: [id number]  HPI: Shikita Vaillancourt is a 70 y.o. female  No chief complaint on file.  COVID POSITIVE  Relevant past medical, surgical, family and social history reviewed and updated as indicated. Interim medical history since our last visit reviewed. Allergies and medications reviewed and updated.  Review of Systems  Per HPI unless specifically indicated above     Objective:    LMP  (LMP Unknown)   Wt Readings from Last 3 Encounters:  12/23/19 215 lb (97.5 kg)  12/16/19 213 lb 9.6 oz (96.9 kg)  10/05/19 215 lb (97.5 kg)    Physical Exam  Results for orders placed or performed in visit on 10/05/19  Comprehensive metabolic panel  Result Value Ref Range   Glucose 79 65 - 99 mg/dL   BUN 18 8 - 27 mg/dL   Creatinine, Ser 3.22 (H) 0.57 - 1.00 mg/dL   GFR calc non Af Amer 50 (L) >59 mL/min/1.73   GFR calc Af Amer 57 (L) >59 mL/min/1.73   BUN/Creatinine Ratio 16 12 - 28   Sodium 140 134 - 144 mmol/L   Potassium 4.2 3.5 - 5.2 mmol/L   Chloride 102 96 - 106 mmol/L   CO2 24 20 - 29 mmol/L   Calcium 9.4 8.7 - 10.3 mg/dL   Total Protein 7.2 6.0 - 8.5 g/dL   Albumin 4.3 3.8 - 4.8 g/dL   Globulin, Total 2.9 1.5 - 4.5 g/dL   Albumin/Globulin Ratio 1.5 1.2 - 2.2   Bilirubin Total 0.3 0.0 - 1.2 mg/dL   Alkaline Phosphatase 75 48 - 121 IU/L   AST 10 0 - 40 IU/L   ALT 9 0 - 32 IU/L  Lipid Panel w/o Chol/HDL Ratio  Result Value Ref Range   Cholesterol, Total 157 100 - 199 mg/dL   Triglycerides 025 0 - 149 mg/dL   HDL 71 >42 mg/dL   VLDL Cholesterol Cal 24 5 - 40 mg/dL   LDL Chol Calc (NIH) 62 0 - 99 mg/dL  HgB H0W  Result Value Ref Range   Hgb A1c MFr Bld 6.0 (H) 4.8 - 5.6 %   Est. average glucose Bld gHb Est-mCnc 126 mg/dL      Assessment & Plan:   Problem List Items Addressed This Visit   None    Follow up plan: No follow-ups on file.

## 2020-12-05 ENCOUNTER — Other Ambulatory Visit: Payer: Self-pay | Admitting: Nurse Practitioner

## 2020-12-05 NOTE — Telephone Encounter (Signed)
Requested medications are due for refill today yes  Requested medications are on the active medication list yes  Last refill 11/06/20  Last visit I do not see this med/dx addressed in OV note  Future visit scheduled No  Notes to clinic Please assess.

## 2020-12-08 ENCOUNTER — Other Ambulatory Visit: Payer: Self-pay | Admitting: Nurse Practitioner

## 2020-12-08 NOTE — Telephone Encounter (Signed)
Requested medication (s) are due for refill today: Yes  Requested medication (s) are on the active medication list: Yes  Last refill:  11/06/20  Future visit scheduled: No  Notes to clinic:  Unable to refill per protocol, appointment needed.      Requested Prescriptions  Pending Prescriptions Disp Refills   meloxicam (MOBIC) 15 MG tablet [Pharmacy Med Name: MELOXICAM 15 MG TABLET] 30 tablet 0    Sig: TAKE 1 TABLET BY MOUTH EVERY DAY AS NEEDED     Analgesics:  COX2 Inhibitors Failed - 12/08/2020  1:04 PM      Failed - HGB in normal range and within 360 days    Hemoglobin  Date Value Ref Range Status  01/13/2018 12.8 11.1 - 15.9 g/dL Final          Failed - Cr in normal range and within 360 days    Creatinine  Date Value Ref Range Status  11/15/2013 0.90 0.60 - 1.30 mg/dL Final   Creatinine, Ser  Date Value Ref Range Status  10/05/2019 1.14 (H) 0.57 - 1.00 mg/dL Final          Passed - Patient is not pregnant      Passed - Valid encounter within last 12 months    Recent Outpatient Visits           11 months ago Depression, major, single episode, severe (HCC)   [facility] Gabriel Cirri, NP   11 months ago Insomnia, unspecified type   [facility] Gabriel Cirri, NP   1 year ago Essential hypertension   [facility] [place], [place]   1 year ago Right leg pain   [facility] [place], Corrie Dandy T, NP   1 year ago Right leg pain   [facility] Valentino Nose, NP

## 2020-12-08 NOTE — Telephone Encounter (Signed)
Patient called, left VM to return the call to the office to schedule an OV in order to receive medication refills. 

## 2020-12-11 NOTE — Progress Notes (Signed)
BP (!) 156/93   Pulse 90   Ht 5\' 4"  (1.626 m)   Wt 219 lb (99.3 kg)   LMP  (LMP Unknown)   BMI 37.59 kg/m    Subjective:    Patient ID: , female    DOB: 09-12-1950, 70 y.o.   MRN: [id number]  HPI: Ashley Mcknight is a 70 y.o. female  Chief Complaint  Patient presents with   Insomnia   Depression    Husband of 50 years passed away 3 months ago   Patient states she is having a difficult time sleeping.  She was taking the trazodone but it wasn't helping her sleep.  She has tried anything else. Patient states she is sad.  She was married for 50 years.  Denies SI.   Flowsheet Row Office Visit from 12/12/2020 in [facility]  PHQ-9 Total Score 11         Relevant past medical, surgical, family and social history reviewed and updated as indicated. Interim medical history since our last visit reviewed. Allergies and medications reviewed and updated.  Review of Systems  Psychiatric/Behavioral:  Positive for dysphoric mood and sleep disturbance. Negative for suicidal ideas.    Per HPI unless specifically indicated above     Objective:    BP (!) 156/93   Pulse 90   Ht 5\' 4"  (1.626 m)   Wt 219 lb (99.3 kg)   LMP  (LMP Unknown)   BMI 37.59 kg/m   Wt Readings from Last 3 Encounters:  12/12/20 219 lb (99.3 kg)  12/23/19 215 lb (97.5 kg)  12/16/19 213 lb 9.6 oz (96.9 kg)    Physical Exam Vitals and nursing note reviewed.  Constitutional:      General: She is not in acute distress.    Appearance: Normal appearance. She is normal weight. She is not ill-appearing, toxic-appearing or diaphoretic.  HENT:     Head: Normocephalic.     Right Ear: External ear normal.     Left Ear: External ear normal.     Nose: Nose normal.     Mouth/Throat:     Mouth: Mucous membranes are moist.     Pharynx: Oropharynx is clear.  Eyes:     General:        Right eye: No discharge.        Left eye: No discharge.     Extraocular Movements: Extraocular  movements intact.     Conjunctiva/sclera: Conjunctivae normal.     Pupils: Pupils are equal, round, and reactive to light.  Cardiovascular:     Rate and Rhythm: Normal rate and regular rhythm.     Heart sounds: No murmur heard. Pulmonary:     Effort: Pulmonary effort is normal. No respiratory distress.     Breath sounds: Normal breath sounds. No wheezing or rales.  Musculoskeletal:     Cervical back: Normal range of motion and neck supple.  Skin:    General: Skin is warm and dry.     Capillary Refill: Capillary refill takes less than 2 seconds.  Neurological:     General: No focal deficit present.     Mental Status: She is alert and oriented to person, place, and time. Mental status is at baseline.  Psychiatric:        Mood and Affect: Mood normal.        Behavior: Behavior normal.        Thought Content: Thought content normal.  Judgment: Judgment normal.     Comments: Crying during exam.    Results for orders placed or performed in visit on 10/05/19  Comprehensive metabolic panel  Result Value Ref Range   Glucose 79 65 - 99 mg/dL   BUN 18 8 - 27 mg/dL   Creatinine, Ser 0.25 (H) 0.57 - 1.00 mg/dL   GFR calc non Af Amer 50 (L) >59 mL/min/1.73   GFR calc Af Amer 57 (L) >59 mL/min/1.73   BUN/Creatinine Ratio 16 12 - 28   Sodium 140 134 - 144 mmol/L   Potassium 4.2 3.5 - 5.2 mmol/L   Chloride 102 96 - 106 mmol/L   CO2 24 20 - 29 mmol/L   Calcium 9.4 8.7 - 10.3 mg/dL   Total Protein 7.2 6.0 - 8.5 g/dL   Albumin 4.3 3.8 - 4.8 g/dL   Globulin, Total 2.9 1.5 - 4.5 g/dL   Albumin/Globulin Ratio 1.5 1.2 - 2.2   Bilirubin Total 0.3 0.0 - 1.2 mg/dL   Alkaline Phosphatase 75 48 - 121 IU/L   AST 10 0 - 40 IU/L   ALT 9 0 - 32 IU/L  Lipid Panel w/o Chol/HDL Ratio  Result Value Ref Range   Cholesterol, Total 157 100 - 199 mg/dL   Triglycerides 852 0 - 149 mg/dL   HDL 71 >77 mg/dL   VLDL Cholesterol Cal 24 5 - 40 mg/dL   LDL Chol Calc (NIH) 62 0 - 99 mg/dL  HgB O2U  Result  Value Ref Range   Hgb A1c MFr Bld 6.0 (H) 4.8 - 5.6 %   Est. average glucose Bld gHb Est-mCnc 126 mg/dL      Assessment & Plan:   Problem List Items Addressed This Visit       Cardiovascular and Mediastinum   Hypertension    Chronic.  Elevated at visit but patient is visibly upset.  Will follow up in 1 month.  If still elevated will adjust medications at that time. Patient will have insurance at next visit, we will draw labs at that time.       Relevant Medications   lisinopril (ZESTRIL) 40 MG tablet   hydrochlorothiazide (HYDRODIURIL) 25 MG tablet   atorvastatin (LIPITOR) 10 MG tablet     Other   Depression, major, single episode, severe (HCC) - Primary    Chronic. Patient's husband passed away 3 months ago. Trazadone is no long working for her.  Discussed changing medication to Nortripyline 25mg  at bedtime. Discussed side effects and benefits of medication with patient during visit.  Follow up in 1 month.      Relevant Medications   nortriptyline (PAMELOR) 25 MG capsule     Follow up plan: Return in about 1 month (around 01/11/2021) for Sleep, BP Check.

## 2020-12-12 ENCOUNTER — Other Ambulatory Visit: Payer: Self-pay

## 2020-12-12 ENCOUNTER — Encounter: Payer: Self-pay | Admitting: Nurse Practitioner

## 2020-12-12 ENCOUNTER — Ambulatory Visit (INDEPENDENT_AMBULATORY_CARE_PROVIDER_SITE_OTHER): Payer: Self-pay | Admitting: Nurse Practitioner

## 2020-12-12 VITALS — BP 156/93 | HR 90 | Ht 64.0 in | Wt 219.0 lb

## 2020-12-12 DIAGNOSIS — I10 Essential (primary) hypertension: Secondary | ICD-10-CM

## 2020-12-12 DIAGNOSIS — F322 Major depressive disorder, single episode, severe without psychotic features: Secondary | ICD-10-CM

## 2020-12-12 MED ORDER — HYDROCHLOROTHIAZIDE 25 MG PO TABS: 25.0000 mg | ORAL_TABLET | Freq: Every day | ORAL | 1 refills | Status: DC

## 2020-12-12 MED ORDER — LISINOPRIL 40 MG PO TABS: 40.0000 mg | ORAL_TABLET | Freq: Every day | ORAL | 1 refills | Status: DC

## 2020-12-12 MED ORDER — ATORVASTATIN CALCIUM 10 MG PO TABS: 10.0000 mg | ORAL_TABLET | Freq: Every day | ORAL | 1 refills | Status: DC

## 2020-12-12 MED ORDER — NORTRIPTYLINE HCL 25 MG PO CAPS: 25.0000 mg | ORAL_CAPSULE | Freq: Every day | ORAL | 0 refills | Status: DC

## 2020-12-12 NOTE — Assessment & Plan Note (Signed)
Chronic. Patient's husband passed away 3 months ago. Trazadone is no long working for her.  Discussed changing medication to Nortripyline 25mg  at bedtime. Discussed side effects and benefits of medication with patient during visit.  Follow up in 1 month.

## 2020-12-12 NOTE — Assessment & Plan Note (Signed)
Chronic.  Elevated at visit but patient is visibly upset.  Will follow up in 1 month.  If still elevated will adjust medications at that time. Patient will have insurance at next visit, we will draw labs at that time.

## 2020-12-19 ENCOUNTER — Ambulatory Visit (INDEPENDENT_AMBULATORY_CARE_PROVIDER_SITE_OTHER): Payer: Medicaid Other | Admitting: Licensed Clinical Social Worker

## 2020-12-19 DIAGNOSIS — G4700 Insomnia, unspecified: Secondary | ICD-10-CM

## 2020-12-19 DIAGNOSIS — F322 Major depressive disorder, single episode, severe without psychotic features: Secondary | ICD-10-CM

## 2020-12-19 DIAGNOSIS — I10 Essential (primary) hypertension: Secondary | ICD-10-CM

## 2020-12-19 DIAGNOSIS — E785 Hyperlipidemia, unspecified: Secondary | ICD-10-CM

## 2020-12-19 DIAGNOSIS — N183 Chronic kidney disease, stage 3 unspecified: Secondary | ICD-10-CM

## 2020-12-19 NOTE — Chronic Care Management (AMB) (Signed)
Chronic Care Management    Clinical Social Work Note  12/19/2020 Name: Ashley Mcknight MRN: [id number] DOB: 1950/07/22  Ashley Mcknight is a 70 y.o. year old female who is a primary care patient of Ashley Grooms, NP. The CCM team was consulted to assist the patient with chronic disease management and/or care coordination needs related to: Mental Health Counseling and Resources, Grief Counseling, and Caregiver Stress.   Engaged with patient by telephone for follow up visit in response to provider referral for social work chronic care management and care coordination services.   Consent to Services:  The patient was given information about Chronic Care Management services, agreed to services, and gave verbal consent prior to initiation of services.  Please see initial visit note for detailed documentation.   Patient agreed to services and consent obtained.   Consent to Services:  The patient was given information about Care Management services, agreed to services, and gave verbal consent prior to initiation of services.  Please see initial visit note for detailed documentation.   Patient agreed to services today and consent obtained.   Assessment: Engaged with patient by phone in response to provider referral for social work care coordination services: Mental Health Counseling and Resources, Grief Counseling, and Caregiver Stress.    Patient continues to maintain positive progress with care plan goals. She is compliant with medication management and reports a decrease in depression symptoms. Patient endorses dry mouth since beginning medication; however, shared drinking water assists with alleviating symptoms. See Care Plan below for interventions and patient self-care activities.  Recent life changes or stressors: Management of health conditions  Recommendation: Patient may benefit from, and is in agreement work with LCSW to address care coordination needs and will continue to work  with the clinical team to address health care and disease management related needs.   Follow up Plan: Patient would like continued follow-up from CCM LCSW .  per patient's request will follow up in 02/01/21.  Will call office if needed prior to next encounter.  SDOH (Social Determinants of Health) assessments and interventions performed:  NA  Advanced Directives Status: Not addressed in this encounter.  CCM Care Plan  No Known Allergies  Outpatient Encounter Medications as of 12/19/2020  Medication Sig   aspirin 81 MG tablet Take 81 mg by mouth daily.   atorvastatin (LIPITOR) 10 MG tablet Take 1 tablet (10 mg total) by mouth daily.   hydrochlorothiazide (HYDRODIURIL) 25 MG tablet Take 1 tablet (25 mg total) by mouth daily.   lisinopril (ZESTRIL) 40 MG tablet Take 1 tablet (40 mg total) by mouth daily.   nortriptyline (PAMELOR) 25 MG capsule Take 1 capsule (25 mg total) by mouth at bedtime.   No facility-administered encounter medications on file as of 12/19/2020.    Patient Active Problem List   Diagnosis Date Noted   CKD (chronic kidney disease) stage 3, GFR 30-59 ml/min (HCC) 02/26/2020   Insomnia 12/16/2019   Depression, major, single episode, severe (HCC) 12/16/2019   Right leg pain 06/08/2019   Hyperlipemia 09/30/2017   Hypertension 09/15/2014   Sleep related leg cramps 09/15/2014   Impaired fasting glucose 09/15/2014   Obesity 09/15/2014    Conditions to be addressed/monitored: Depression; Mental Health Concerns   Care Plan : General Social Work (Adult)  Updates made by Ashley Lucks D, LCSW since 12/19/2020 12:00 AM     Problem: Caregiver Stress      Long-Range Goal: Caregiver Coping Optimized   Start Date: 06/01/2020  This  Visit's Progress: On track  Recent Progress: On track  Priority: Medium  Note:   Timeframe:  Long Term Goal Priority:  Medium Start Date:  06/01/20                           Expected End Date:  03/17/21                    Follow Up  Date-02/01/21  Current barriers:   Chronic Mental Health needs related to depression and caregiver strain Currently unable to independently self manage needs related to mental health conditions. Knowledge Deficits related to short term plan for care coordination needs and long term plans for chronic disease management Lacks knowledge of where and how to connect for mental health support. Patient is unsure if she wishes to pursue mental health resources at this time but will discuss with PCP during appointment on 06/06/20. Needs Support, Education, and Care Coordination in order to meet unmet need.  Clinical Goal(s): Over the next 120 days, patient will implement healthy self-care and work with SW to reduce or manage symptoms of agitation, anxiety, depression, insomnia, mood instability, and stress until connected for ongoing counseling.  Clinical Interventions:  Assessed patient's previous treatment, needs, coping skills, current treatment, support system and barriers to care Patient interviewed and appropriate assessments performed or reviewed: brief mental health assessment;Suicidal Ideation/Homicidal Ideation: No Patient reports difficulty managing depression symptoms triggered by stress and grief. She filled recent medication and reports symptoms of overwhelming sadness and irritability have decreased. 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 Patient reports experiencing depressive symptoms and low energy. 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 She reports that she has 2 daughters that help with caregiving and also are experiencing caregiver burnout symptoms. Patient reports that 1 daughter lives in [place] and the other in [place]. Patient was strongly encouraged to consider medication therapy and counseling and was provided extensive education on available mental health resources that accept her insurance but patient shares that she will need time to consider this resource implementation first.  Discussed several options for long term counseling based on need and insurance. Assisted patient with narrowing the options down to [facility] or [facility]. However, patient is currently hesitant about starting mental health treatment and would like time to consider these treatment options.  Reviewed medications with patient prescribed by PCP and discussed compliance  CCM LCSW used empathetic and active and reflective listening, validated patient's feelings/concerns, and provided emotional support. Strategies to promote self-care and relaxation discussed Depression screen reviewed  Solution-Focused Strategies Active listening / Reflection utilized  Emotional Supportive Provided Reviewed mental health medications with patient and discussed compliance:  Quality of sleep assessed & Sleep Hygiene techniques promoted  Collaboration with PCP regarding development and update of comprehensive plan of care as evidenced by provider attestation and co-signature Inter-disciplinary care team collaboration (see longitudinal plan of care) Patient Goals/Self-Care Activities: Over the next 120 days  Implement interventions discussed today to decreases symptoms of agitation, anxiety, depression, insomnia, mood instability, and stress and increase knowledge and/or ability of: coping skills, healthy habits, self-management skills, and stress reduction Contact clinic with any questions or concerns Attend all scheduled  appointments with providers Continue compliance with medications     Ashley Lucks, MSW, LCSW [facility] [place]  [facility] [email or web address] Phone [phone] 12:07 PM

## 2020-12-19 NOTE — Patient Instructions (Signed)
Visit Information   Goals Addressed             This Visit's Progress    Track and Manage My Symptoms-Depression   On track    Timeframe:  Long Term Goal Priority:  Medium Start Date:  06/01/20                           Expected End Date:  03/17/21                    Follow Up Date-02/01/21  Patient Goals/Self-Care Activities: Over the next 120 days Implement interventions discussed today to decreases symptoms of agitation, anxiety, depression, insomnia, mood instability, and stress and increase knowledge and/or ability of: coping skills, healthy habits, self-management skills, and stress reduction Contact clinic with any questions or concerns Attend all scheduled appointments with providers Continue compliance with medications        Patient verbalizes understanding of instructions provided today and agrees to view in MyChart.   Telephone follow up appointment with care management team member scheduled for:02/01/21  Jenel Lucks, MSW, LCSW [facility] Care [facility]  [facility] [email or web address] Phone [phone] 12:10 PM

## 2021-01-09 ENCOUNTER — Other Ambulatory Visit: Payer: Self-pay | Admitting: Nurse Practitioner

## 2021-01-09 NOTE — Telephone Encounter (Signed)
Requested medications are due for refill today yes  Requested medications are on the active medication list yes  Last refill 9/27  Last visit 9/27  Future visit scheduled 10/27  Notes to clinic patient trying this med with one month follow up which is in two days, please assess.   Requested Prescriptions  Pending Prescriptions Disp Refills   nortriptyline (PAMELOR) 25 MG capsule [Pharmacy Med Name: NORTRIPTYLINE HCL 25 MG CAP] 30 capsule 0    Sig: TAKE 1 CAPSULE BY MOUTH AT BEDTIME.     Psychiatry:  Antidepressants - Heterocyclics (TCAs) Passed - 01/09/2021  1:21 AM      Passed - Completed PHQ-2 or PHQ-9 in the last 360 days      Passed - Valid encounter within last 6 months    Recent Outpatient Visits           4 weeks ago Depression, major, single episode, severe (HCC)   [facility] Larae Grooms, NP   1 year ago Depression, major, single episode, severe (HCC)   [facility] Gabriel Cirri, NP   1 year ago Insomnia, unspecified type   [facility] Gabriel Cirri, NP   1 year ago Essential hypertension   [facility] [place] M[place], [place]   1 year ago Right leg pain   [facility] Marjie Skiff, NP       Future Appointments             In 2 days Larae Grooms, NP [facility]

## 2021-01-10 NOTE — Progress Notes (Signed)
BP (!) 154/88   Pulse 86   Ht 5\' 4"  (1.626 m)   Wt 217 lb (98.4 kg)   LMP  (LMP Unknown)   SpO2 98%   BMI 37.25 kg/m    Subjective:    Patient ID: , female    DOB: Dec 22, 1950, 70 y.o.   MRN: [id number]  HPI: Ashley Mcknight is a 70 y.o. female  Chief Complaint  Patient presents with   Hypertension   Insomnia   Patient states she is having a difficult time sleeping.  After last visit she was started on the Nortriptyline for sleep and depression.  States hasn't helped her.    HYPERTENSION Hypertension status: controlled  Satisfied with current treatment? no Duration of hypertension: years BP monitoring frequency:  not checking BP range:  BP medication side effects:  no Medication compliance: excellent compliance Previous BP meds:HCTZ and lisinopril Aspirin: no Recurrent headaches: no Visual changes: no Palpitations: no Dyspnea: no Chest pain: no Lower extremity edema: no Dizzy/lightheaded: no   Relevant past medical, surgical, family and social history reviewed and updated as indicated. Interim medical history since our last visit reviewed. Allergies and medications reviewed and updated.  Review of Systems  Eyes:  Negative for visual disturbance.  Respiratory:  Negative for cough, chest tightness and shortness of breath.   Cardiovascular:  Negative for chest pain, palpitations and leg swelling.  Neurological:  Negative for dizziness and headaches.  Psychiatric/Behavioral:  Positive for dysphoric mood and sleep disturbance. Negative for suicidal ideas.    Per HPI unless specifically indicated above     Objective:    BP (!) 154/88   Pulse 86   Ht 5\' 4"  (1.626 m)   Wt 217 lb (98.4 kg)   LMP  (LMP Unknown)   SpO2 98%   BMI 37.25 kg/m   Wt Readings from Last 3 Encounters:  01/11/21 217 lb (98.4 kg)  12/12/20 219 lb (99.3 kg)  12/23/19 215 lb (97.5 kg)    Physical Exam Vitals and nursing note reviewed.  Constitutional:       General: She is not in acute distress.    Appearance: Normal appearance. She is normal weight. She is not ill-appearing, toxic-appearing or diaphoretic.  HENT:     Head: Normocephalic.     Right Ear: External ear normal.     Left Ear: External ear normal.     Nose: Nose normal.     Mouth/Throat:     Mouth: Mucous membranes are moist.     Pharynx: Oropharynx is clear.  Eyes:     General:        Right eye: No discharge.        Left eye: No discharge.     Extraocular Movements: Extraocular movements intact.     Conjunctiva/sclera: Conjunctivae normal.     Pupils: Pupils are equal, round, and reactive to light.  Cardiovascular:     Rate and Rhythm: Normal rate and regular rhythm.     Heart sounds: No murmur heard. Pulmonary:     Effort: Pulmonary effort is normal. No respiratory distress.     Breath sounds: Normal breath sounds. No wheezing or rales.  Musculoskeletal:     Cervical back: Normal range of motion and neck supple.  Skin:    General: Skin is warm and dry.     Capillary Refill: Capillary refill takes less than 2 seconds.  Neurological:     General: No focal deficit present.     Mental  Status: She is alert and oriented to person, place, and time. Mental status is at baseline.  Psychiatric:        Mood and Affect: Mood normal.        Behavior: Behavior normal.        Thought Content: Thought content normal.        Judgment: Judgment normal.     Comments: Crying during exam.    Results for orders placed or performed in visit on 10/05/19  Comprehensive metabolic panel  Result Value Ref Range   Glucose 79 65 - 99 mg/dL   BUN 18 8 - 27 mg/dL   Creatinine, Ser 0.86 (H) 0.57 - 1.00 mg/dL   GFR calc non Af Amer 50 (L) >59 mL/min/1.73   GFR calc Af Amer 57 (L) >59 mL/min/1.73   BUN/Creatinine Ratio 16 12 - 28   Sodium 140 134 - 144 mmol/L   Potassium 4.2 3.5 - 5.2 mmol/L   Chloride 102 96 - 106 mmol/L   CO2 24 20 - 29 mmol/L   Calcium 9.4 8.7 - 10.3 mg/dL   Total  Protein 7.2 6.0 - 8.5 g/dL   Albumin 4.3 3.8 - 4.8 g/dL   Globulin, Total 2.9 1.5 - 4.5 g/dL   Albumin/Globulin Ratio 1.5 1.2 - 2.2   Bilirubin Total 0.3 0.0 - 1.2 mg/dL   Alkaline Phosphatase 75 48 - 121 IU/L   AST 10 0 - 40 IU/L   ALT 9 0 - 32 IU/L  Lipid Panel w/o Chol/HDL Ratio  Result Value Ref Range   Cholesterol, Total 157 100 - 199 mg/dL   Triglycerides 761 0 - 149 mg/dL   HDL 71 >95 mg/dL   VLDL Cholesterol Cal 24 5 - 40 mg/dL   LDL Chol Calc (NIH) 62 0 - 99 mg/dL  HgB K9T  Result Value Ref Range   Hgb A1c MFr Bld 6.0 (H) 4.8 - 5.6 %   Est. average glucose Bld gHb Est-mCnc 126 mg/dL      Assessment & Plan:   Problem List Items Addressed This Visit       Cardiovascular and Mediastinum   Hypertension - Primary    Chronic. Not well controlled at visit today.  Patient just worked 16 hours over night before coming in for her appointment. Discussed with patient that she should check her blood pressure at home daily and keep a log.  Bring it with her to her next visit.  If blood pressures are still elevated will begin Amlodipine 5mg .  Follow up in 1 month.        Other   Insomnia    Chronic.  Not well controlled. Will increase Nortriptyline to 50mg  daily to see if symptoms improve.  Follow up in 1 month for reevaluation.         Follow up plan: Return in about 1 month (around 02/11/2021) for BP Check.

## 2021-01-11 ENCOUNTER — Encounter: Payer: Self-pay | Admitting: Nurse Practitioner

## 2021-01-11 ENCOUNTER — Ambulatory Visit (INDEPENDENT_AMBULATORY_CARE_PROVIDER_SITE_OTHER): Payer: Self-pay | Admitting: Nurse Practitioner

## 2021-01-11 ENCOUNTER — Other Ambulatory Visit: Payer: Self-pay

## 2021-01-11 VITALS — BP 154/88 | HR 86 | Ht 64.0 in | Wt 217.0 lb

## 2021-01-11 DIAGNOSIS — G4700 Insomnia, unspecified: Secondary | ICD-10-CM

## 2021-01-11 DIAGNOSIS — I10 Essential (primary) hypertension: Secondary | ICD-10-CM

## 2021-01-11 MED ORDER — NORTRIPTYLINE HCL 50 MG PO CAPS: 50.0000 mg | ORAL_CAPSULE | Freq: Every day | ORAL | 0 refills | Status: DC

## 2021-01-11 NOTE — Assessment & Plan Note (Signed)
Chronic. Not well controlled at visit today.  Patient just worked 16 hours over night before coming in for her appointment. Discussed with patient that she should check her blood pressure at home daily and keep a log.  Bring it with her to her next visit.  If blood pressures are still elevated will begin Amlodipine 5mg .  Follow up in 1 month.

## 2021-01-11 NOTE — Assessment & Plan Note (Signed)
Chronic.  Not well controlled. Will increase Nortriptyline to 50mg  daily to see if symptoms improve.  Follow up in 1 month for reevaluation.

## 2021-01-15 DIAGNOSIS — F322 Major depressive disorder, single episode, severe without psychotic features: Secondary | ICD-10-CM

## 2021-01-15 DIAGNOSIS — N183 Chronic kidney disease, stage 3 unspecified: Secondary | ICD-10-CM | POA: Diagnosis not present

## 2021-01-15 DIAGNOSIS — I10 Essential (primary) hypertension: Secondary | ICD-10-CM

## 2021-01-15 DIAGNOSIS — E785 Hyperlipidemia, unspecified: Secondary | ICD-10-CM | POA: Diagnosis not present

## 2021-02-01 ENCOUNTER — Ambulatory Visit (INDEPENDENT_AMBULATORY_CARE_PROVIDER_SITE_OTHER): Payer: 59 | Admitting: Licensed Clinical Social Worker

## 2021-02-01 DIAGNOSIS — F322 Major depressive disorder, single episode, severe without psychotic features: Secondary | ICD-10-CM

## 2021-02-01 DIAGNOSIS — I10 Essential (primary) hypertension: Secondary | ICD-10-CM

## 2021-02-01 DIAGNOSIS — G4700 Insomnia, unspecified: Secondary | ICD-10-CM

## 2021-02-01 NOTE — Chronic Care Management (AMB) (Signed)
    Clinical Social Work  Care Management   Phone Outreach    02/01/2021 Name: Ashley Mcknight MRN: [id number] DOB: 1951-01-17  Ashley Mcknight is a 70 y.o. year old female who is a primary care patient of Larae Grooms, NP .   Reason for referral: Mental Health Counseling and Resources.    F/U phone call today to assess needs, progress and barriers with care plan goals.   Unable to keep phone appointment today and requested to reschedule.  Plan:Appointment was rescheduled with CCM LCSW  Review of patient status, including review of consultants reports, relevant laboratory and other test results, and collaboration with appropriate care team members and the patient's provider was performed as part of comprehensive patient evaluation and provision of care management services.    Ashley Mcknight, MSW, LCSW [facility] [place]  [facility] [email or web address] Phone [phone] 2:38 PM

## 2021-02-02 ENCOUNTER — Ambulatory Visit: Payer: Medicaid Other | Admitting: Licensed Clinical Social Worker

## 2021-02-02 DIAGNOSIS — F322 Major depressive disorder, single episode, severe without psychotic features: Secondary | ICD-10-CM

## 2021-02-02 DIAGNOSIS — E785 Hyperlipidemia, unspecified: Secondary | ICD-10-CM

## 2021-02-02 DIAGNOSIS — G4700 Insomnia, unspecified: Secondary | ICD-10-CM

## 2021-02-02 DIAGNOSIS — I10 Essential (primary) hypertension: Secondary | ICD-10-CM

## 2021-02-05 NOTE — Patient Instructions (Signed)
Visit Information  Thank you for taking time to visit with me today. Please don't hesitate to contact me if I can be of assistance to you before our next scheduled telephone appointment.  Following are the goals we discussed today:  Patient Goals/Self-Care Activities:  Implement interventions discussed today to decreases symptoms of agitation, anxiety, depression, insomnia, mood instability, and stress and increase knowledge and/or ability of: coping skills, healthy habits, self-management skills, and stress reduction Contact clinic with any questions or concerns Attend all scheduled appointments with providers Continue compliance with medications  Our next appointment is by telephone on 04/30/21 at 9:00 AM  Please call the care guide team at [phone] if you need to cancel or reschedule your appointment.   Please call 911 if you are experiencing a Mental Health or Behavioral Health Crisis or need someone to talk to.  Patient verbalizes understanding of instructions provided today and agrees to view in MyChart.   Jenel Lucks, MSW, LCSW [facility] [place]  [facility] [email or web address] Phone [phone] 6:42 AM

## 2021-02-05 NOTE — Chronic Care Management (AMB) (Signed)
Chronic Care Management    Clinical Social Work Note  02/05/2021 Name: Ashley Mcknight MRN: [id number] DOB: [birth year]  Ashley Mcknight is a 70 y.o. year old female who is a primary care patient of Larae Grooms, NP. The CCM team was consulted to assist the patient with chronic disease management and/or care coordination needs related to: Mental Health Counseling and Resources.   Engaged with patient by telephone for follow up visit in response to provider referral for social work chronic care management and care coordination services.   Consent to Services:  The patient was given information about Chronic Care Management services, agreed to services, and gave verbal consent prior to initiation of services.  Please see initial visit note for detailed documentation.   Patient agreed to services and consent obtained.   Consent to Services:  The patient was given information about Care Management services, agreed to services, and gave verbal consent prior to initiation of services.  Please see initial visit note for detailed documentation.   Patient agreed to services today and consent obtained.  Engaged with patient by phone in response to provider referral for social work care coordination services:  Assessment/Interventions:  Patient continues to maintain positive progress with care plan goals. She reports compliance of med management, improvement in sleep, and decrease in depression symptoms See Care Plan below for interventions and patient self-care activities.  Recommendation: Patient may benefit from, and is in agreement work with LCSW to address care coordination needs and will continue to work with the clinical team to address health care and disease management related needs.   Follow up Plan: Patient would like continued follow-up from CCM LCSW .  per patient's request will follow up in 04/30/21.  Will call office if needed prior to next encounter.  SDOH (Social Determinants  of Health) assessments and interventions performed:    Advanced Directives Status: Not addressed in this encounter.  CCM Care Plan  No Known Allergies  Outpatient Encounter Medications as of 02/02/2021  Medication Sig   aspirin 81 MG tablet Take 81 mg by mouth daily.   atorvastatin (LIPITOR) 10 MG tablet Take 1 tablet (10 mg total) by mouth daily.   hydrochlorothiazide (HYDRODIURIL) 25 MG tablet Take 1 tablet (25 mg total) by mouth daily.   lisinopril (ZESTRIL) 40 MG tablet Take 1 tablet (40 mg total) by mouth daily.   nortriptyline (PAMELOR) 50 MG capsule Take 1 capsule (50 mg total) by mouth at bedtime.   No facility-administered encounter medications on file as of 02/02/2021.    Patient Active Problem List   Diagnosis Date Noted   CKD (chronic kidney disease) stage 3, GFR 30-59 ml/min (HCC) 02/26/2020   Insomnia 12/16/2019   Depression, major, single episode, severe (HCC) 12/16/2019   Right leg pain 06/08/2019   Hyperlipemia 09/30/2017   Hypertension 09/15/2014   Sleep related leg cramps 09/15/2014   Impaired fasting glucose 09/15/2014   Obesity 09/15/2014    Conditions to be addressed/monitored: Depression  Care Plan : General Social Work (Adult)  Updates made by Jenel Lucks D, LCSW since 02/05/2021 12:00 AM     Problem: Caregiver Stress      Long-Range Goal: Caregiver Coping Optimized   Start Date: 06/01/2020  This Visit's Progress: On track  Recent Progress: On track  Priority: Medium  Note:   Timeframe:  Long Term Goal Priority:  Medium Start Date:  06/01/20  Expected End Date: 05/15/21                    Follow Up Date-04/30/21  Current barriers:   Chronic Mental Health needs related to depression and caregiver strain Currently unable to independently self manage needs related to mental health conditions. Knowledge Deficits related to short term plan for care coordination needs and long term plans for chronic disease  management Lacks knowledge of where and how to connect for mental health support. Patient is unsure if she wishes to pursue mental health resources at this time but will discuss with PCP during appointment on 06/06/20. Needs Support, Education, and Care Coordination in order to meet unmet need.  Clinical Goal(s): Over the next 120 days, patient will implement healthy self-care and work with SW to reduce or manage symptoms of agitation, anxiety, depression, insomnia, mood instability, and stress until connected for ongoing counseling.  Clinical Interventions:  Assessed patient's previous treatment, needs, coping skills, current treatment, support system and barriers to care Patient interviewed and appropriate assessments performed or reviewed: brief mental health assessment;Suicidal Ideation/Homicidal Ideation: No Patient reports difficulty managing depression symptoms triggered by stress and grief. She filled recent medication and reports symptoms of overwhelming sadness and irritability have decreased. 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 Patient reports experiencing depressive symptoms and low energy. 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 She reports that she has 2 daughters that help with caregiving and also are experiencing caregiver burnout symptoms. Patient reports that 1  daughter lives in [place] and the other in [place]. Patient was strongly encouraged to consider medication therapy and counseling and was provided extensive education on available mental health resources that accept her insurance but patient shares that she will need time to consider this resource implementation first. 11/18: Sleep has improved within the last month Discussed several options for long term counseling based on need and insurance. Assisted patient with narrowing the options down to [facility] or [facility]. However, patient is currently hesitant about starting mental health treatment and would like time to consider these treatment options.  Reviewed medications with patient prescribed by PCP and discussed compliance  CCM LCSW used empathetic and active and reflective listening, validated patient's feelings/concerns, and provided emotional support. Strategies to promote self-care and relaxation discussed Depression screen reviewed  Solution-Focused Strategies Active listening / Reflection utilized  Emotional Supportive Provided Reviewed mental health medications with patient and discussed compliance:  Quality of sleep assessed & Sleep Hygiene techniques promoted  Collaboration with PCP regarding development and update of comprehensive plan of care as evidenced by provider attestation and co-signature Inter-disciplinary care team collaboration (see longitudinal plan of care) Patient Goals/Self-Care Activities: Over the next 120 days Implement interventions discussed today to decreases symptoms of agitation, anxiety, depression, insomnia, mood instability, and stress and increase knowledge and/or ability of: coping skills, healthy habits, self-management skills, and stress reduction Contact  clinic with any questions or concerns Attend all scheduled appointments with providers Continue compliance with medications      Jenel Lucks, MSW, LCSW [facility] Care  Management [place]  [facility] [email or web address] Phone [phone] 6:41 AM

## 2021-02-07 ENCOUNTER — Ambulatory Visit: Payer: Medicaid Other | Admitting: Nurse Practitioner

## 2021-02-14 DIAGNOSIS — F322 Major depressive disorder, single episode, severe without psychotic features: Secondary | ICD-10-CM

## 2021-02-16 ENCOUNTER — Other Ambulatory Visit: Payer: Self-pay

## 2021-02-16 ENCOUNTER — Encounter: Payer: Self-pay | Admitting: Nurse Practitioner

## 2021-02-16 ENCOUNTER — Ambulatory Visit (INDEPENDENT_AMBULATORY_CARE_PROVIDER_SITE_OTHER): Payer: 59 | Admitting: Nurse Practitioner

## 2021-02-16 VITALS — BP 124/75 | HR 95 | Temp 98.2°F | Ht 63.0 in | Wt 217.5 lb

## 2021-02-16 DIAGNOSIS — E785 Hyperlipidemia, unspecified: Secondary | ICD-10-CM

## 2021-02-16 DIAGNOSIS — I10 Essential (primary) hypertension: Secondary | ICD-10-CM | POA: Diagnosis not present

## 2021-02-16 DIAGNOSIS — N183 Chronic kidney disease, stage 3 unspecified: Secondary | ICD-10-CM | POA: Diagnosis not present

## 2021-02-16 DIAGNOSIS — F322 Major depressive disorder, single episode, severe without psychotic features: Secondary | ICD-10-CM | POA: Diagnosis not present

## 2021-02-16 DIAGNOSIS — Z23 Encounter for immunization: Secondary | ICD-10-CM | POA: Diagnosis not present

## 2021-02-16 DIAGNOSIS — R7301 Impaired fasting glucose: Secondary | ICD-10-CM

## 2021-02-16 DIAGNOSIS — Z1231 Encounter for screening mammogram for malignant neoplasm of breast: Secondary | ICD-10-CM

## 2021-02-16 MED ORDER — NORTRIPTYLINE HCL 50 MG PO CAPS: 50.0000 mg | ORAL_CAPSULE | Freq: Every day | ORAL | 1 refills | Status: DC

## 2021-02-16 NOTE — Assessment & Plan Note (Signed)
Labs ordered today.  Will make recommendations based on lab results. ?

## 2021-02-16 NOTE — Patient Instructions (Signed)
Please call to schedule your mammogram and/or bone density: °[facility]  °Address: [address], [place], [place] [address]  °Phone: [phone] ° °

## 2021-02-16 NOTE — Progress Notes (Signed)
BP 124/75   Pulse 95   Temp 98.2 F (36.8 C) (Oral)   Ht _0  (1.6 m)   Wt 217 lb 8 oz (98.7 kg)   LMP  (LMP Unknown)   SpO2 97%   BMI 38.53 kg/m    Subjective:    Patient ID: Ashley Mcknight, female    DOB: [birth year], 70 y.o.   MRN: [id number]  HPI: Ashley Mcknight is a 70 y.o. female  Chief Complaint  Patient presents with   Hypertension   Depression    Blood pressure follow up.Patient has no comments  or questions at this time   Diabetes   Hyperlipidemia   Patient states she is having a difficult time sleeping.  After last visit she was started on the Nortriptyline for sleep and depression.  States hasn't helped her.    HYPERTENSION Hypertension status: controlled  Satisfied with current treatment? no Duration of hypertension: years BP monitoring frequency:  not checking BP range:  BP medication side effects:  no Medication compliance: excellent compliance Previous BP meds:HCTZ and lisinopril Aspirin: no Recurrent headaches: no Visual changes: no Palpitations: no Dyspnea: no Chest pain: no Lower extremity edema: no Dizzy/lightheaded: no  CHRONIC KIDNEY DISEASE CKD status: controlled Medications renally dose: yes Previous renal evaluation: no Pneumovax:   will get at her next visit Influenza Vaccine:  Up to Date  DEPRESSION Patient states the Nortripyline is working to help her sleep.  She is starting to take care of herself and is feeling better.   [place] Office Visit from 02/16/2021 in [place]  PHQ-9 Total Score 0      GAD 7 : Generalized Anxiety Score 02/16/2021 12/23/2019  Nervous, Anxious, on Edge 0 0  Control/stop worrying 0 3  Worry too much - different things 0 3  Trouble relaxing 0 3  Restless 0 0  Easily annoyed or irritable 0 1  Afraid - awful might happen 0 1  Total GAD 7 Score 0 11  Anxiety Difficulty - Not difficult at all       Relevant past medical, surgical, family and social history reviewed  and updated as indicated. Interim medical history since our last visit reviewed. Allergies and medications reviewed and updated.  Review of Systems  Eyes:  Negative for visual disturbance.  Respiratory:  Negative for cough, chest tightness and shortness of breath.   Cardiovascular:  Negative for chest pain, palpitations and leg swelling.  Neurological:  Negative for dizziness and headaches.  Psychiatric/Behavioral:  Negative for dysphoric mood, sleep disturbance and suicidal ideas.    Per HPI unless specifically indicated above     Objective:    BP 124/75   Pulse 95   Temp 98.2 F (36.8 C) (Oral)   Ht _1  (1.6 m)   Wt 217 lb 8 oz (98.7 kg)   LMP  (LMP Unknown)   SpO2 97%   BMI 38.53 kg/m   Wt Readings from Last 3 Encounters:  02/16/21 217 lb 8 oz (98.7 kg)  01/11/21 217 lb (98.4 kg)  12/12/20 219 lb (99.3 kg)    Physical Exam Vitals and nursing note reviewed.  Constitutional:      General: She is not in acute distress.    Appearance: Normal appearance. She is obese. She is not ill-appearing, toxic-appearing or diaphoretic.  HENT:     Head: Normocephalic.     Right Ear: External ear normal.     Left Ear: External ear normal.  Nose: Nose normal.     Mouth/Throat:     Mouth: Mucous membranes are moist.     Pharynx: Oropharynx is clear.  Eyes:     General:        Right eye: No discharge.        Left eye: No discharge.     Extraocular Movements: Extraocular movements intact.     Conjunctiva/sclera: Conjunctivae normal.     Pupils: Pupils are equal, round, and reactive to light.  Cardiovascular:     Rate and Rhythm: Normal rate and regular rhythm.     Heart sounds: No murmur heard. Pulmonary:     Effort: Pulmonary effort is normal. No respiratory distress.     Breath sounds: Normal breath sounds. No wheezing or rales.  Musculoskeletal:     Cervical back: Normal range of motion and neck supple.  Skin:    General: Skin is warm and dry.     Capillary Refill:  Capillary refill takes less than 2 seconds.  Neurological:     General: No focal deficit present.     Mental Status: She is alert and oriented to person, place, and time. Mental status is at baseline.  Psychiatric:        Mood and Affect: Mood normal.        Behavior: Behavior normal.        Thought Content: Thought content normal.        Judgment: Judgment normal.     Comments: Crying during exam.    Results for orders placed or performed in visit on 10/05/19  Comprehensive metabolic panel  Result Value Ref Range   Glucose 79 65 - 99 mg/dL   BUN 18 8 - 27 mg/dL   Creatinine, Ser 1.14 (H) 0.57 - 1.00 mg/dL   GFR calc non Af Amer 50 (L) >59 mL/min/1.73   GFR calc Af Amer 57 (L) >59 mL/min/1.73   BUN/Creatinine Ratio 16 12 - 28   Sodium 140 134 - 144 mmol/L   Potassium 4.2 3.5 - 5.2 mmol/L   Chloride 102 96 - 106 mmol/L   CO2 24 20 - 29 mmol/L   Calcium 9.4 8.7 - 10.3 mg/dL   Total Protein 7.2 6.0 - 8.5 g/dL   Albumin 4.3 3.8 - 4.8 g/dL   Globulin, Total 2.9 1.5 - 4.5 g/dL   Albumin/Globulin Ratio 1.5 1.2 - 2.2   Bilirubin Total 0.3 0.0 - 1.2 mg/dL   Alkaline Phosphatase 75 48 - 121 IU/L   AST 10 0 - 40 IU/L   ALT 9 0 - 32 IU/L  Lipid Panel w/o Chol/HDL Ratio  Result Value Ref Range   Cholesterol, Total 157 100 - 199 mg/dL   Triglycerides 145 0 - 149 mg/dL   HDL 71 >39 mg/dL   VLDL Cholesterol Cal 24 5 - 40 mg/dL   LDL Chol Calc (NIH) 62 0 - 99 mg/dL  HgB A1c  Result Value Ref Range   Hgb A1c MFr Bld 6.0 (H) 4.8 - 5.6 %   Est. average glucose Bld gHb Est-mCnc 126 mg/dL      Assessment & Plan:   Problem List Items Addressed This Visit       Cardiovascular and Mediastinum   Hypertension - Primary    Chronic.  Controlled.  Continue with current medication regimen.  Labs ordered today.  Return to clinic in 3 months for reevaluation.  Call sooner if concerns arise.        Relevant Orders  Comp Met (CMET)     Endocrine   Impaired fasting glucose    Labs ordered  today. Will make recommendations based on lab results.       Relevant Orders   HgB A1c     Genitourinary   CKD (chronic kidney disease) stage 3, GFR 30-59 ml/min (HCC)    Labs ordered today. Will make recommendations based on lab results.         Other   Hyperlipemia    Labs ordered today. Will make recommendations based on lab results.      Relevant Orders   Lipid Profile   Depression, major, single episode, severe (HCC)    Chronic.  Controlled.  Has improved since last visit.  Patient is sleeping a lot better with the increased dose of Nortriptyline.  Patient is also seeing a therapist which is helping with the passing of her husband.  Continue with current medication regimen.  Labs ordered today.  Return to clinic in 3 months for reevaluation.  Call sooner if concerns arise.        Relevant Medications   nortriptyline (PAMELOR) 50 MG capsule   Other Relevant Orders   Comp Met (CMET)   Other Visit Diagnoses     Need for influenza vaccination       Relevant Orders   Flu Vaccine QUAD High Dose(Fluad)   Encounter for screening mammogram for malignant neoplasm of breast       Relevant Orders   MM Digital Screening        Follow up plan: Return in about 3 months (around 05/17/2021) for Physical and Fasting labs.

## 2021-02-16 NOTE — Assessment & Plan Note (Signed)
Chronic.  Controlled.  Has improved since last visit.  Patient is sleeping a lot better with the increased dose of Nortriptyline.  Patient is also seeing a therapist which is helping with the passing of her husband.  Continue with current medication regimen.  Labs ordered today.  Return to clinic in 3 months for reevaluation.  Call sooner if concerns arise.

## 2021-02-16 NOTE — Assessment & Plan Note (Signed)
Chronic.  Controlled.  Continue with current medication regimen.  Labs ordered today.  Return to clinic in 3 months for reevaluation.  Call sooner if concerns arise.   

## 2021-02-17 LAB — COMPREHENSIVE METABOLIC PANEL
ALT: 13 IU/L (ref 0–32)
AST: 16 IU/L (ref 0–40)
Albumin/Globulin Ratio: 1.6 (ref 1.2–2.2)
Albumin: 4.1 g/dL (ref 3.8–4.8)
Alkaline Phosphatase: 92 IU/L (ref 44–121)
BUN/Creatinine Ratio: 15 (ref 12–28)
BUN: 15 mg/dL (ref 8–27)
Bilirubin Total: <0.2 mg/dL (ref 0.0–1.2)
CO2: 25 mmol/L (ref 20–29)
Calcium: 9.6 mg/dL (ref 8.7–10.3)
Chloride: 103 mmol/L (ref 96–106)
Creatinine, Ser: 1.02 mg/dL — ABNORMAL HIGH (ref 0.57–1.00)
Globulin, Total: 2.6 g/dL (ref 1.5–4.5)
Glucose: 90 mg/dL (ref 70–99)
Potassium: 4.6 mmol/L (ref 3.5–5.2)
Sodium: 141 mmol/L (ref 134–144)
Total Protein: 6.7 g/dL (ref 6.0–8.5)
eGFR: 60 mL/min/{1.73_m2} (ref >59)

## 2021-02-17 LAB — HEMOGLOBIN A1C
Est. average glucose Bld gHb Est-mCnc: 137 mg/dL
Hgb A1c MFr Bld: 6.4 % — ABNORMAL HIGH (ref 4.8–5.6)

## 2021-02-17 LAB — LIPID PANEL
Chol/HDL Ratio: 2.2 ratio (ref 0.0–4.4)
Cholesterol, Total: 146 mg/dL (ref 100–199)
HDL: 66 mg/dL (ref >39)
LDL Chol Calc (NIH): 59 mg/dL (ref 0–99)
Triglycerides: 119 mg/dL (ref 0–149)
VLDL Cholesterol Cal: 21 mg/dL (ref 5–40)

## 2021-02-19 NOTE — Progress Notes (Signed)
Hi Ashley Mcknight.  Your lab work looks good.  Kidney function improved some.  Your a1c did increase slightly to 6.4.  Make sure you decrease your carbohydrate intake.  Please let me know if you have any questions. I will see you at our next visit.

## 2021-04-08 ENCOUNTER — Other Ambulatory Visit: Payer: Self-pay | Admitting: Nurse Practitioner

## 2021-04-08 NOTE — Telephone Encounter (Signed)
last RF 12/12/20 #90 3 RF pt has enough to last to upcoming appt  Requested Prescriptions  Refused Prescriptions Disp Refills   lisinopril (ZESTRIL) 40 MG tablet [Pharmacy Med Name: LISINOPRIL 40 MG TABLET] 90 tablet 1    Sig: TAKE 1 TABLET BY MOUTH EVERY DAY     Cardiovascular:  ACE Inhibitors Failed - 04/08/2021 12:42 AM      Failed - Cr in normal range and within 180 days    Creatinine  Date Value Ref Range Status  11/15/2013 0.90 0.60 - 1.30 mg/dL Final   Creatinine, Ser  Date Value Ref Range Status  02/16/2021 1.02 (H) 0.57 - 1.00 mg/dL Final         Passed - K in normal range and within 180 days    Potassium  Date Value Ref Range Status  02/16/2021 4.6 3.5 - 5.2 mmol/L Final  11/15/2013 3.3 (L) 3.5 - 5.1 mmol/L Final         Passed - Patient is not pregnant      Passed - Last BP in normal range    BP Readings from Last 1 Encounters:  02/16/21 124/75         Passed - Valid encounter within last 6 months    Recent Outpatient Visits          1 month ago Primary hypertension   [facility] Larae Grooms, NP   2 months ago Primary hypertension   [facility] Larae Grooms, NP   3 months ago Depression, major, single episode, severe (HCC)   [facility] Larae Grooms, NP   1 year ago Depression, major, single episode, severe (HCC)   [facility] Gabriel Cirri, NP   1 year ago Insomnia, unspecified type   [facility] Gabriel Cirri, NP      Future Appointments            In 1 month Larae Grooms, NP [facility], [facility]

## 2021-04-30 ENCOUNTER — Ambulatory Visit: Payer: Medicaid Other | Admitting: Licensed Clinical Social Worker

## 2021-04-30 DIAGNOSIS — I10 Essential (primary) hypertension: Secondary | ICD-10-CM

## 2021-04-30 DIAGNOSIS — N183 Chronic kidney disease, stage 3 unspecified: Secondary | ICD-10-CM

## 2021-04-30 NOTE — Chronic Care Management (AMB) (Signed)
Care Management Clinical Social Work Note  04/30/2021 Name: Ashley Mcknight MRN: [id number] DOB: 1950/04/24  Ashley Mcknight is a 71 y.o. year old female who is a primary care patient of Larae Grooms, NP.  The Care Management team was consulted for assistance with chronic disease management and coordination needs.  Engaged with patient by telephone for follow up visit in response to provider referral for social work chronic care management and care coordination services  Consent to Services:  Ms. [name] was given information about Care Management services today including:  Care Management services includes personalized support from designated clinical staff supervised by her physician, including individualized plan of care and coordination with other care providers 24/7 contact phone numbers for assistance for urgent and routine care needs. The patient may stop case management services at any time by phone call to the office staff.  Patient agreed to services and consent obtained.   Summary: Assessed patient's current treatment, progress, coping skills, support system and barriers to care. Patient reports improved management of symptoms. CCM LCSW discussed stages of grief, triggers, and healthy coping skills. See Care Plan below for interventions and patient self-care activities.  Recommendation: Patient may benefit from, and is in agreement work with LCSW to address care coordination needs and will continue to work with the clinical team to address health care and disease management related needs.   Follow up Plan: Patient would like continued follow-up from CCM LCSW.  per patient's request will follow up in 07/30/21.  Will call office if needed prior to next encounter.   SDOH (Social Determinants of Health) assessments and interventions performed:    Advanced Directives Status: Not addressed in this encounter.  Care Plan  No Known Allergies  Outpatient Encounter Medications as  of 04/30/2021  Medication Sig   aspirin 81 MG tablet Take 81 mg by mouth daily.   atorvastatin (LIPITOR) 10 MG tablet Take 1 tablet (10 mg total) by mouth daily.   hydrochlorothiazide (HYDRODIURIL) 25 MG tablet Take 1 tablet (25 mg total) by mouth daily.   lisinopril (ZESTRIL) 40 MG tablet Take 1 tablet (40 mg total) by mouth daily.   nortriptyline (PAMELOR) 50 MG capsule Take 1 capsule (50 mg total) by mouth at bedtime.   No facility-administered encounter medications on file as of 04/30/2021.    Patient Active Problem List   Diagnosis Date Noted   CKD (chronic kidney disease) stage 3, GFR 30-59 ml/min (HCC) 02/26/2020   Insomnia 12/16/2019   Depression, major, single episode, severe (HCC) 12/16/2019   Right leg pain 06/08/2019   Hyperlipemia 09/30/2017   Hypertension 09/15/2014   Sleep related leg cramps 09/15/2014   Impaired fasting glucose 09/15/2014   Obesity 09/15/2014    Conditions to be addressed/monitored: HTN, CKD Stage Insomnia, and Depression; Grief  Care Plan : General Social Work (Adult)  Updates made by Jenel Lucks D, LCSW since 04/30/2021 12:00 AM     Problem: Caregiver Stress      Long-Range Goal: Caregiver Coping Optimized   Start Date: 06/01/2020  This Visit's Progress: On track  Recent Progress: On track  Priority: Medium  Note:   Current barriers:   Chronic Mental Health needs related to depression and caregiver strain Currently unable to independently self manage needs related to mental health conditions. Knowledge Deficits related to short term plan for care coordination needs and long term plans for chronic disease management Lacks knowledge of where and how to connect for mental health support. Patient is unsure if  she wishes to pursue mental health resources at this time but will discuss with PCP during appointment on 06/06/20. Needs Support, Education, and Care Coordination in order to meet unmet need.  Clinical Goal(s): Over the next 120 days,  patient will implement healthy self-care and work with SW to reduce or manage symptoms of agitation, anxiety, depression, insomnia, mood instability, and stress until connected for ongoing counseling.  Clinical Interventions:  Assessed patient's previous treatment, needs, coping skills, current treatment, support system and barriers to care Patient interviewed and appropriate assessments performed or reviewed: brief mental health assessment;Suicidal Ideation/Homicidal Ideation: No Patient reports difficulty managing depression symptoms triggered by stress and grief. She filled recent medication and reports symptoms of overwhelming sadness and irritability have decreased. Patient states, "meds have calmed me down a bit" 11/18: Patient reports management of stress and depression symptoms 2/13: Patient reports improved management of symptoms. CCM LCSW discussed stages of grief, triggers, and healthy coping skills Patient reports current side effect to the medication is dry mouth which is alleviated with drinking water 11/18: Patient reports symptoms have resolved with increasing water intake 2/13: Patient was experiencing severe pain in foot last week resulting in multiple visits to ED. 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 Patient reports experiencing depressive symptoms and low energy. 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 She reports that she has 2 daughters that help with caregiving and also are experiencing caregiver burnout symptoms. Patient reports that 1  daughter lives in [place] and the other in [place]. Patient was strongly encouraged to consider medication therapy and counseling and was provided extensive education on available mental health resources that accept her insurance but patient shares that she will need time to consider this resource implementation first. 11/18: Sleep has improved within the last month 2/13: Patient endorses slight improvement in sleep hygiene; however, reports ongoing restlessness triggered by grief of spouse. She endorses strong support from family Discussed several options for long term counseling based on need and insurance. Assisted patient with narrowing the options down to [facility] or [facility]. However, patient is currently hesitant about starting mental health treatment and would like time to consider these treatment options.  Reviewed medications with patient prescribed by PCP and discussed compliance  CCM LCSW used empathetic and active and reflective listening, validated patient's feelings/concerns, and provided emotional support. Strategies to promote self-care and relaxation discussed Depression screen reviewed  Solution-Focused Strategies Active listening / Reflection utilized  Emotional Supportive Provided Reviewed mental health medications with patient and discussed compliance:  Quality of sleep assessed & Sleep Hygiene techniques promoted  Collaboration with PCP regarding development and update of comprehensive plan of care as evidenced by provider attestation and co-signature Inter-disciplinary care team collaboration (see longitudinal plan of care) Patient Goals/Self-Care Activities: Over the next 120 days Implement interventions discussed today to decreases symptoms of agitation, anxiety, depression, insomnia, mood instability, and stress and increase knowledge and/or ability of: coping skills, healthy habits, self-management skills, and stress reduction Contact clinic with any questions or  concerns Attend all scheduled appointments  with providers Continue compliance with medications          Jenel Lucks, MSW, LCSW [facility] Care Management [place]   [facility] [email or web address] Phone [phone] 1:45 PM

## 2021-04-30 NOTE — Patient Instructions (Signed)
Visit Information  Thank you for taking time to visit with me today. Please don't hesitate to contact me if I can be of assistance to you before our next scheduled telephone appointment.  Following are the goals we discussed today:  Patient Goals/Self-Care Activities: Over the next 120 days Implement interventions discussed today to decreases symptoms of agitation, anxiety, depression, insomnia, mood instability, and stress and increase knowledge and/or ability of: coping skills, healthy habits, self-management skills, and stress reduction Contact clinic with any questions or concerns Attend all scheduled appointments with providers Continue compliance with medications  Our next appointment is by telephone on 07/30/21 at 10:00 AM  Please call the care guide team at [phone] if you need to cancel or reschedule your appointment.   If you are experiencing a Mental Health or [place] or need someone to talk to, please call the [place] National Suicide Prevention Lifeline: 510-118-9284 or TTY: 914-432-3729 TTY 251 417 5232) to talk to a trained counselor call 911   Patient verbalizes understanding of instructions and care plan provided today and agrees to view in [place]. Active MyChart status confirmed with patient.    Christa See, MSW, [email or web address] Phone [phone] 1:43 PM

## 2021-05-17 ENCOUNTER — Other Ambulatory Visit: Payer: Self-pay

## 2021-05-17 ENCOUNTER — Ambulatory Visit (INDEPENDENT_AMBULATORY_CARE_PROVIDER_SITE_OTHER): Payer: 59 | Admitting: Nurse Practitioner

## 2021-05-17 ENCOUNTER — Encounter: Payer: Self-pay | Admitting: Nurse Practitioner

## 2021-05-17 VITALS — BP 120/82 | HR 92 | Temp 99.5°F | Ht 63.0 in | Wt 222.2 lb

## 2021-05-17 DIAGNOSIS — R7301 Impaired fasting glucose: Secondary | ICD-10-CM | POA: Diagnosis not present

## 2021-05-17 DIAGNOSIS — Z23 Encounter for immunization: Secondary | ICD-10-CM | POA: Diagnosis not present

## 2021-05-17 DIAGNOSIS — E785 Hyperlipidemia, unspecified: Secondary | ICD-10-CM

## 2021-05-17 DIAGNOSIS — I10 Essential (primary) hypertension: Secondary | ICD-10-CM | POA: Diagnosis not present

## 2021-05-17 DIAGNOSIS — Z1211 Encounter for screening for malignant neoplasm of colon: Secondary | ICD-10-CM

## 2021-05-17 DIAGNOSIS — Z13820 Encounter for screening for osteoporosis: Secondary | ICD-10-CM

## 2021-05-17 DIAGNOSIS — Z0000 Encounter for general adult medical examination without abnormal findings: Secondary | ICD-10-CM | POA: Diagnosis not present

## 2021-05-17 DIAGNOSIS — N183 Chronic kidney disease, stage 3 unspecified: Secondary | ICD-10-CM | POA: Diagnosis not present

## 2021-05-17 DIAGNOSIS — F322 Major depressive disorder, single episode, severe without psychotic features: Secondary | ICD-10-CM

## 2021-05-17 LAB — URINALYSIS, ROUTINE W REFLEX MICROSCOPIC
Bilirubin, UA: NEGATIVE
Glucose, UA: NEGATIVE
Ketones, UA: NEGATIVE
Leukocytes,UA: NEGATIVE
Nitrite, UA: NEGATIVE
Protein,UA: NEGATIVE
Specific Gravity, UA: 1.02 (ref 1.005–1.030)
Urobilinogen, Ur: 0.2 mg/dL (ref 0.2–1.0)
pH, UA: 6.5 (ref 5.0–7.5)

## 2021-05-17 LAB — MICROSCOPIC EXAMINATION
Bacteria, UA: NONE SEEN
WBC, UA: NONE SEEN /hpf (ref 0–5)

## 2021-05-17 MED ORDER — HYDROCHLOROTHIAZIDE 25 MG PO TABS: 25.0000 mg | ORAL_TABLET | Freq: Every day | ORAL | 1 refills | Status: DC

## 2021-05-17 MED ORDER — LISINOPRIL 40 MG PO TABS: 40.0000 mg | ORAL_TABLET | Freq: Every day | ORAL | 1 refills | Status: DC

## 2021-05-17 MED ORDER — NORTRIPTYLINE HCL 50 MG PO CAPS: 50.0000 mg | ORAL_CAPSULE | Freq: Every day | ORAL | 1 refills | Status: DC

## 2021-05-17 MED ORDER — ATORVASTATIN CALCIUM 10 MG PO TABS
10.0000 mg | ORAL_TABLET | Freq: Every day | ORAL | 1 refills | Status: DC
Start: 2021-05-17 — End: 2022-01-17

## 2021-05-17 NOTE — Assessment & Plan Note (Signed)
Labs ordered today.  Will make recommendations based on lab results. ?

## 2021-05-17 NOTE — Patient Instructions (Signed)
Please call to schedule your mammogram and/or bone density: °[facility]  °Address: [address], [place], [place] [address]  °Phone: [phone] ° °

## 2021-05-17 NOTE — Assessment & Plan Note (Signed)
Chronic.  Controlled.  Continue with current medication regimen of Atorvastatin 10mg  daily.  Refills set today.  Labs ordered today.  Return to clinic in 6 months for reevaluation.  Call sooner if concerns arise.  ? ?

## 2021-05-17 NOTE — Progress Notes (Signed)
BP 120/82 (BP Location: Right Arm, Cuff Size: Large)    Pulse 92    Temp 99.5 F (37.5 C) (Oral)    Ht '5\' 3"'  (1.6 m)    Wt 222 lb 3.2 oz (100.8 kg)    LMP  (LMP Unknown)    SpO2 99%    BMI 39.36 kg/m    Subjective:    Patient ID: Ashley Mcknight, female    DOB: [birth year], 71 y.o.   MRN: [id number]  HPI: Ashley Mcknight is a 71 y.o. female presenting on 05/17/2021 for comprehensive medical examination. Current medical complaints include:none  She currently lives with: Menopausal Symptoms: no  HYPERTENSION / HYPERLIPIDEMIA Satisfied with current treatment? yes Duration of hypertension: years BP monitoring frequency: not checking BP range:  BP medication side effects: no Past BP meds: HCTZ and lisinopril Duration of hyperlipidemia: years Cholesterol medication side effects: no Cholesterol supplements: none Past cholesterol medications: none and atorvastain (lipitor) Medication compliance: excellent compliance Aspirin: yes Recent stressors: no Recurrent headaches: no Visual changes: no Palpitations: no Dyspnea: no Chest pain: no Lower extremity edema: no Dizzy/lightheaded: no  CHRONIC KIDNEY DISEASE CKD status: controlled Medications renally dose: yes Previous renal evaluation: yes Pneumovax:  Up to Date Influenza Vaccine:  Up to Date  Depression Screen done today and results listed below:  Depression screen [facility] 2/9 05/17/2021 02/16/2021 02/16/2021 12/12/2020 12/23/2019  Decreased Interest 0 0 0 2 0  Down, Depressed, Hopeless 0 0 0 1 3  PHQ - 2 Score 0 0 0 3 3  Altered sleeping 0 0 - 3 3  Tired, decreased energy 0 0 - 3 3  Change in appetite 0 0 - 1 1  Feeling bad or failure about yourself  0 0 - 1 0  Trouble concentrating 0 0 - 0 0  Moving slowly or fidgety/restless 0 0 - 0 1  Suicidal thoughts 0 0 - 0 0  PHQ-9 Score 0 0 - 11 11  Difficult doing work/chores Not difficult at all - - - Not difficult at all    The patient does not have a history of falls. I did  complete a risk assessment for falls. A plan of care for falls was documented.   Past Medical History:  Past Medical History:  Diagnosis Date   Depression    Diabetes mellitus without complication ([place])    Fatigue    Gout    Hyperlipidemia    Hypertension     Surgical History:  Past Surgical History:  Procedure Laterality Date   APPENDECTOMY  2011   CARPAL TUNNEL RELEASE Bilateral    CHOLECYSTECTOMY     KNEE SURGERY Left    ROTATOR CUFF REPAIR     toenail removal     TOTAL ABDOMINAL HYSTERECTOMY W/ BILATERAL SALPINGOOPHORECTOMY      Medications:  Current Outpatient Medications on File Prior to Visit  Medication Sig   aspirin 81 MG tablet Take 81 mg by mouth daily.   No current facility-administered medications on file prior to visit.    Allergies:  No Known Allergies  Social History:  Social History   Socioeconomic History   Marital status: Married    Spouse name: Not on file   Number of children: Not on file   Years of education: Not on file   Highest education level: Not on file  Occupational History   Not on file  Tobacco Use   Smoking status: Never   Smokeless tobacco: Never  Vaping Use  Vaping Use: Never used  Substance and Sexual Activity   Alcohol use: No    Alcohol/week: 0.0 standard drinks   Drug use: No   Sexual activity: Yes  Other Topics Concern   Not on file  Social History Narrative   Not on file   Social Determinants of Health   Financial Resource Strain: Not on file  Food Insecurity: No Food Insecurity   Worried About Running Out of Food in the Last Year: Never true   Ran Out of Food in the Last Year: Never true  Transportation Needs: No Transportation Needs   Lack of Transportation (Medical): No   Lack of Transportation (Non-Medical): No  Physical Activity: Not on file  Stress: Not on file  Social Connections: Not on file  Intimate Partner Violence: Not on file   Social History   Tobacco Use  Smoking Status Never   Smokeless Tobacco Never   Social History   Substance and Sexual Activity  Alcohol Use No   Alcohol/week: 0.0 standard drinks    Family History:  Family History  Problem Relation Age of Onset   Hypertension Mother    Cancer Father        throat   Cancer Brother     Past medical history, surgical history, medications, allergies, family history and social history reviewed with patient today and changes made to appropriate areas of the chart.   Review of Systems  Eyes:  Negative for blurred vision and double vision.  Respiratory:  Negative for shortness of breath.   Cardiovascular:  Negative for chest pain, palpitations and leg swelling.  Neurological:  Negative for dizziness and headaches.  All other ROS negative except what is listed above and in the HPI.      Objective:    BP 120/82 (BP Location: Right Arm, Cuff Size: Large)    Pulse 92    Temp 99.5 F (37.5 C) (Oral)    Ht '5\' 3"'  (1.6 m)    Wt 222 lb 3.2 oz (100.8 kg)    LMP  (LMP Unknown)    SpO2 99%    BMI 39.36 kg/m   Wt Readings from Last 3 Encounters:  05/17/21 222 lb 3.2 oz (100.8 kg)  02/16/21 217 lb 8 oz (98.7 kg)  01/11/21 217 lb (98.4 kg)    Physical Exam Vitals and nursing note reviewed.  Constitutional:      General: She is awake. She is not in acute distress.    Appearance: She is well-developed. She is not ill-appearing.  HENT:     Head: Normocephalic and atraumatic.     Right Ear: Hearing, tympanic membrane, ear canal and external ear normal. No drainage.     Left Ear: Hearing, tympanic membrane, ear canal and external ear normal. No drainage.     Nose: Nose normal.     Right Sinus: No maxillary sinus tenderness or frontal sinus tenderness.     Left Sinus: No maxillary sinus tenderness or frontal sinus tenderness.     Mouth/Throat:     Mouth: Mucous membranes are moist.     Pharynx: Oropharynx is clear. Uvula midline. No pharyngeal swelling, oropharyngeal exudate or posterior oropharyngeal erythema.   Eyes:     General: Lids are normal.        Right eye: No discharge.        Left eye: No discharge.     Extraocular Movements: Extraocular movements intact.     Conjunctiva/sclera: Conjunctivae normal.     Pupils: Pupils  are equal, round, and reactive to light.     Visual Fields: Right eye visual fields normal and left eye visual fields normal.  Neck:     Thyroid: No thyromegaly.     Vascular: No carotid bruit.     Trachea: Trachea normal.  Cardiovascular:     Rate and Rhythm: Normal rate and regular rhythm.     Heart sounds: Normal heart sounds. No murmur heard.   No gallop.  Pulmonary:     Effort: Pulmonary effort is normal. No accessory muscle usage or respiratory distress.     Breath sounds: Normal breath sounds.  Chest:  Breasts:    Right: Normal.     Left: Normal.  Abdominal:     General: Bowel sounds are normal.     Palpations: Abdomen is soft. There is no hepatomegaly or splenomegaly.     Tenderness: There is no abdominal tenderness.  Musculoskeletal:        General: Normal range of motion.     Cervical back: Normal range of motion and neck supple.     Right lower leg: No edema.     Left lower leg: No edema.  Lymphadenopathy:     Head:     Right side of head: No submental, submandibular, tonsillar, preauricular or posterior auricular adenopathy.     Left side of head: No submental, submandibular, tonsillar, preauricular or posterior auricular adenopathy.     Cervical: No cervical adenopathy.     Upper Body:     Right upper body: No supraclavicular, axillary or pectoral adenopathy.     Left upper body: No supraclavicular, axillary or pectoral adenopathy.  Skin:    General: Skin is warm and dry.     Capillary Refill: Capillary refill takes less than 2 seconds.     Findings: No rash.  Neurological:     Mental Status: She is alert and oriented to person, place, and time.     Gait: Gait is intact.     Deep Tendon Reflexes: Reflexes are normal and symmetric.      Reflex Scores:      Brachioradialis reflexes are 2+ on the right side and 2+ on the left side.      Patellar reflexes are 2+ on the right side and 2+ on the left side. Psychiatric:        Attention and Perception: Attention normal.        Mood and Affect: Mood normal.        Speech: Speech normal.        Behavior: Behavior normal. Behavior is cooperative.        Thought Content: Thought content normal.        Judgment: Judgment normal.    Results for orders placed or performed in visit on 02/16/21  Comp Met (CMET)  Result Value Ref Range   Glucose 90 70 - 99 mg/dL   BUN 15 8 - 27 mg/dL   Creatinine, Ser 1.02 (H) 0.57 - 1.00 mg/dL   eGFR 60 >59 mL/min/1.73   BUN/Creatinine Ratio 15 12 - 28   Sodium 141 134 - 144 mmol/L   Potassium 4.6 3.5 - 5.2 mmol/L   Chloride 103 96 - 106 mmol/L   CO2 25 20 - 29 mmol/L   Calcium 9.6 8.7 - 10.3 mg/dL   Total Protein 6.7 6.0 - 8.5 g/dL   Albumin 4.1 3.8 - 4.8 g/dL   Globulin, Total 2.6 1.5 - 4.5 g/dL   Albumin/Globulin Ratio 1.6 1.2 -  2.2   Bilirubin Total <0.2 0.0 - 1.2 mg/dL   Alkaline Phosphatase 92 44 - 121 IU/L   AST 16 0 - 40 IU/L   ALT 13 0 - 32 IU/L  Lipid Profile  Result Value Ref Range   Cholesterol, Total 146 100 - 199 mg/dL   Triglycerides 119 0 - 149 mg/dL   HDL 66 >39 mg/dL   VLDL Cholesterol Cal 21 5 - 40 mg/dL   LDL Chol Calc (NIH) 59 0 - 99 mg/dL   Chol/HDL Ratio 2.2 0.0 - 4.4 ratio  HgB A1c  Result Value Ref Range   Hgb A1c MFr Bld 6.4 (H) 4.8 - 5.6 %   Est. average glucose Bld gHb Est-mCnc 137 mg/dL      Assessment & Plan:   Problem List Items Addressed This Visit       Cardiovascular and Mediastinum   Hypertension    Chronic.  Controlled.  Continue with current medication regimen of HCTZ 51m and Lisinopril 165m  Refills sent today.  Labs ordered today.  Return to clinic in 6 months for reevaluation.  Call sooner if concerns arise.        Relevant Medications   atorvastatin (LIPITOR) 10 MG tablet    hydrochlorothiazide (HYDRODIURIL) 25 MG tablet   lisinopril (ZESTRIL) 40 MG tablet     Endocrine   Impaired fasting glucose    Labs ordered today. Will make recommendations based on lab results.      Relevant Orders   HgB A1c     Genitourinary   CKD (chronic kidney disease) stage 3, GFR 30-59 ml/min (HCC)    Labs ordered today. Will make recommendations based on lab results.      Relevant Orders   Comprehensive metabolic panel     Other   Hyperlipemia    Chronic.  Controlled.  Continue with current medication regimen of Atorvastatin 1010maily.  Refills set today.  Labs ordered today.  Return to clinic in 6 months for reevaluation.  Call sooner if concerns arise.        Relevant Medications   atorvastatin (LIPITOR) 10 MG tablet   hydrochlorothiazide (HYDRODIURIL) 25 MG tablet   lisinopril (ZESTRIL) 40 MG tablet   Other Relevant Orders   Lipid panel   Depression, major, single episode, severe (HCC)    Chronic.  Controlled.  Continue with current medication regimen of Nortriptyline 24m52mily.  Refills sent today. Labs ordered today.  Return to clinic in 6 months for reevaluation.  Call sooner if concerns arise.        Relevant Medications   nortriptyline (PAMELOR) 50 MG capsule   Other Visit Diagnoses     Annual physical exam    -  Primary   Health maintenance reviewed during visit today. Labs ordered. Mammogram, Dexa, Cologuard and Pneumona ordered today.   Relevant Orders   HgB A1c   CBC with Differential/Platelet   Comprehensive metabolic panel   Lipid panel   TSH   Urinalysis, Routine w reflex microscopic   Screening for colon cancer       Relevant Orders   Cologuard   Screening for osteoporosis       Relevant Orders   DG Bone Density   Need for pneumococcal vaccination       Relevant Orders   Pneumococcal conjugate vaccine 13-valent IM        Follow up plan: Return in about 6 months (around 11/17/2021) for HTN, HLD, DM2 FU.   LABORATORY TESTING:   -  Pap smear: not applicable  IMMUNIZATIONS:   - Tdap: Tetanus vaccination status reviewed: last tetanus booster within 10 years. - Influenza: Up to date - Pneumovax: Administered today - Prevnar: Not applicable - COVID: Up to date - HPV: Not applicable - Shingrix vaccine:  Discussed at visit today  SCREENING: -Mammogram: Ordered today  - Colonoscopy: Ordered today  - Bone Density: Ordered today -Hearing Test: Not applicable  -Spirometry: Not applicable   PATIENT COUNSELING:   Advised to take 1 mg of folate supplement per day if capable of pregnancy.   Sexuality: Discussed sexually transmitted diseases, partner selection, use of condoms, avoidance of unintended pregnancy  and contraceptive alternatives.   Advised to avoid cigarette smoking.  I discussed with the patient that most people either abstain from alcohol or drink within safe limits (<=14/week and <=4 drinks/occasion for males, <=7/weeks and <= 3 drinks/occasion for females) and that the risk for alcohol disorders and other health effects rises proportionally with the number of drinks per week and how often a drinker exceeds daily limits.  Discussed cessation/primary prevention of drug use and availability of treatment for abuse.   Diet: Encouraged to adjust caloric intake to maintain  or achieve ideal body weight, to reduce intake of dietary saturated fat and total fat, to limit sodium intake by avoiding high sodium foods and not adding table salt, and to maintain adequate dietary potassium and calcium preferably from fresh fruits, vegetables, and low-fat dairy products.    stressed the importance of regular exercise  Injury prevention: Discussed safety belts, safety helmets, smoke detector, smoking near bedding or upholstery.   Dental health: Discussed importance of regular tooth brushing, flossing, and dental visits.    NEXT PREVENTATIVE PHYSICAL DUE IN 1 YEAR. Return in about 6 months (around 11/17/2021) for HTN,  HLD, DM2 FU.

## 2021-05-17 NOTE — Assessment & Plan Note (Signed)
Chronic.  Controlled.  Continue with current medication regimen of Nortriptyline 50mg  daily.  Refills sent today. Labs ordered today.  Return to clinic in 6 months for reevaluation.  Call sooner if concerns arise.  ? ?

## 2021-05-17 NOTE — Assessment & Plan Note (Signed)
Chronic.  Controlled.  Continue with current medication regimen of HCTZ 25mg  and Lisinopril 10mg .  Refills sent today.  Labs ordered today.  Return to clinic in 6 months for reevaluation.  Call sooner if concerns arise.  ? ?

## 2021-05-18 LAB — LIPID PANEL

## 2021-05-19 LAB — CBC WITH DIFFERENTIAL/PLATELET
Basophils Absolute: 0 10*3/uL (ref 0.0–0.2)
Basos: 0 %
EOS (ABSOLUTE): 0.3 10*3/uL (ref 0.0–0.4)
Eos: 4 %
Hematocrit: 38.3 % (ref 34.0–46.6)
Hemoglobin: 12.3 g/dL (ref 11.1–15.9)
Immature Grans (Abs): 0 10*3/uL (ref 0.0–0.1)
Immature Granulocytes: 0 %
Lymphocytes Absolute: 3.1 10*3/uL (ref 0.7–3.1)
Lymphs: 40 %
MCH: 26.1 pg — ABNORMAL LOW (ref 26.6–33.0)
MCHC: 32.1 g/dL (ref 31.5–35.7)
MCV: 81 fL (ref 79–97)
Monocytes Absolute: 0.6 10*3/uL (ref 0.1–0.9)
Monocytes: 8 %
Neutrophils Absolute: 3.7 10*3/uL (ref 1.4–7.0)
Neutrophils: 48 %
Platelets: 288 10*3/uL (ref 150–450)
RBC: 4.71 x10E6/uL (ref 3.77–5.28)
RDW: 13 % (ref 11.7–15.4)
WBC: 7.8 10*3/uL (ref 3.4–10.8)

## 2021-05-19 LAB — COMPREHENSIVE METABOLIC PANEL
ALT: 12 IU/L (ref 0–32)
AST: 13 IU/L (ref 0–40)
Albumin/Globulin Ratio: 1.5 (ref 1.2–2.2)
Albumin: 4.2 g/dL (ref 3.8–4.8)
Alkaline Phosphatase: 92 IU/L (ref 44–121)
BUN/Creatinine Ratio: 15 (ref 12–28)
BUN: 13 mg/dL (ref 8–27)
Bilirubin Total: <0.2 mg/dL (ref 0.0–1.2)
CO2: 21 mmol/L (ref 20–29)
Calcium: 9.2 mg/dL (ref 8.7–10.3)
Chloride: 101 mmol/L (ref 96–106)
Creatinine, Ser: 0.87 mg/dL (ref 0.57–1.00)
Globulin, Total: 2.8 g/dL (ref 1.5–4.5)
Glucose: 87 mg/dL (ref 70–99)
Potassium: 4 mmol/L (ref 3.5–5.2)
Sodium: 140 mmol/L (ref 134–144)
Total Protein: 7 g/dL (ref 6.0–8.5)
eGFR: 73 mL/min/{1.73_m2} (ref >59)

## 2021-05-19 LAB — LIPID PANEL
Chol/HDL Ratio: 2 ratio (ref 0.0–4.4)
Cholesterol, Total: 143 mg/dL (ref 100–199)
HDL: 70 mg/dL (ref >39)
LDL Chol Calc (NIH): 56 mg/dL (ref 0–99)
Triglycerides: 94 mg/dL (ref 0–149)
VLDL Cholesterol Cal: 17 mg/dL (ref 5–40)

## 2021-05-19 LAB — HEMOGLOBIN A1C
Est. average glucose Bld gHb Est-mCnc: 137 mg/dL
Hgb A1c MFr Bld: 6.4 % — ABNORMAL HIGH (ref 4.8–5.6)

## 2021-05-19 LAB — TSH: TSH: 1.47 u[IU]/mL (ref 0.450–4.500)

## 2021-05-21 NOTE — Progress Notes (Signed)
Hi Cayli. It was nice to see you last week.  Your lab work looks good.  Your A1c remains well controlled at 6.4.  Keep up the good work.  No concerns at this time. Continue with your current medication regimen.  Follow up as discussed.  Please let me know if you have any questions.  ?

## 2021-06-13 ENCOUNTER — Ambulatory Visit: Payer: Self-pay

## 2021-06-13 NOTE — Telephone Encounter (Signed)
Chief Complaint: Rash ?Symptoms: Scaly, whelps under both breasts and in between, on both arms near wrist ?Frequency: Onset Saturday ?Pertinent Negatives: Patient denies itching ?Disposition: [] ED /[] Urgent Care (no appt availability in office) / [x] Appointment(In office/virtual)/ []  [place] [facility]/ [] Home Care/ [] Refused Recommended Disposition /[] [place] [facility]/ []  Follow-up with PCP ?Additional Notes: N/A ? ? ? ? ? ?Reason for Disposition ? Mild widespread rash ? ?Answer Assessment - Initial Assessment Questions ?1. APPEARANCE of RASH: "Describe the rash." (e.g., spots, blisters, raised areas, skin peeling, scaly) ?    Looks healing, whelps ?2. SIZE: "How big are the spots?" (e.g., tip of pen, eraser, coin; inches, centimeters) ?    Big ?3. LOCATION: "Where is the rash located?" ?    In the middle of breast and underneath breasts, both arms above wrist, under neck, on back ?4. COLOR: "What color is the rash?" (Note: It is difficult to assess rash color in people with darker-colored skin. When this situation occurs, simply ask the caller to describe what they see.) ?    Red ?5. ONSET: "When did the rash begin?" ?    Over the weekend I noticed it ?6. FEVER: "Do you have a fever?" If Yes, ask: "What is your temperature, how was it measured, and when did it start?" ?    No ?7. ITCHING: "Does the rash itch?" If Yes, ask: "How bad is the itch?" (Scale 1-10; or mild, moderate, severe) ?    No itching ?8. CAUSE: "What do you think is causing the rash?" ?    N/A ?9. MEDICINE FACTORS: "Have you started any new medicines within the last 2 weeks?" (e.g., antibiotics)  ?    No ?10. OTHER SYMPTOMS: "Do you have any other symptoms?" (e.g., dizziness, headache, sore throat, joint pain) ?      No ?11. PREGNANCY: "Is there any chance you are pregnant?" "When was your last menstrual period?" ?      N/A ? ?Protocols used: Rash or Redness - Widespread-A-AH ? ?

## 2021-06-14 ENCOUNTER — Encounter: Payer: Self-pay | Admitting: Internal Medicine

## 2021-06-14 ENCOUNTER — Ambulatory Visit: Payer: 59 | Admitting: Internal Medicine

## 2021-06-14 VITALS — BP 130/77 | HR 99 | Temp 99.0°F | Ht 62.99 in | Wt 223.4 lb

## 2021-06-14 DIAGNOSIS — R21 Rash and other nonspecific skin eruption: Secondary | ICD-10-CM

## 2021-06-14 MED ORDER — CLOTRIMAZOLE-BETAMETHASONE 1-0.05 % EX CREA: 1.0000 "application " | TOPICAL_CREAM | Freq: Every day | CUTANEOUS | 0 refills | Status: DC

## 2021-06-14 NOTE — Progress Notes (Signed)
? ?BP 130/77   Pulse 99   Temp 99 ?F (37.2 ?C) (Oral)   Ht 5' 2.99" (1.6 m)   Wt 223 lb 6.4 oz (101.3 kg)   LMP  (LMP Unknown)   SpO2 98%   BMI 39.58 kg/m?   ? ?Subjective:  ? ? Patient ID: Ashley Mcknight, female    DOB: Nov 14, 1950, 71 y.o.   MRN: [id number] ? ?Chief Complaint  ?Patient presents with  ?? Rash  ?  Rash on chest in between and under breasts, and on arms. Started last weekend  ? ? ?HPI: ?Ashley Mcknight is a 71 y.o. female ? ?Rash ?This is a new (under bil breasts and on chest wall area.) problem. The current episode started in the past 7 days. The affected locations include the chest. The rash is characterized by redness and itchiness.  ? ?Chief Complaint  ?Patient presents with  ?? Rash  ?  Rash on chest in between and under breasts, and on arms. Started last weekend  ? ? ?Relevant past medical, surgical, family and social history reviewed and updated as indicated. Interim medical history since our last visit reviewed. ?Allergies and medications reviewed and updated. ? ?Review of Systems  ?Skin:  Positive for rash.  ? ?Per HPI unless specifically indicated above ? ?   ?Objective:  ?  ?BP 130/77   Pulse 99   Temp 99 ?F (37.2 ?C) (Oral)   Ht 5' 2.99" (1.6 m)   Wt 223 lb 6.4 oz (101.3 kg)   LMP  (LMP Unknown)   SpO2 98%   BMI 39.58 kg/m?   ?Wt Readings from Last 3 Encounters:  ?06/14/21 223 lb 6.4 oz (101.3 kg)  ?05/17/21 222 lb 3.2 oz (100.8 kg)  ?02/16/21 217 lb 8 oz (98.7 kg)  ?  ?Physical Exam ? ?Results for orders placed or performed in visit on 05/17/21  ?Microscopic Examination  ? Urine  ?Result Value Ref Range  ? WBC, UA None seen 0 - 5 /hpf  ? RBC 0-2 0 - 2 /hpf  ? Epithelial Cells (non renal) 0-10 0 - 10 /hpf  ? Bacteria, UA None seen None seen/Few  ?HgB A1c  ?Result Value Ref Range  ? Hgb A1c MFr Bld 6.4 (H) 4.8 - 5.6 %  ? Est. average glucose Bld gHb Est-mCnc 137 mg/dL  ?CBC with Differential/Platelet  ?Result Value Ref Range  ? WBC 7.8 3.4 - 10.8 x10E3/uL  ? RBC 4.71 3.77  - 5.28 x10E6/uL  ? Hemoglobin 12.3 11.1 - 15.9 g/dL  ? Hematocrit 38.3 34.0 - 46.6 %  ? MCV 81 79 - 97 fL  ? MCH 26.1 (L) 26.6 - 33.0 pg  ? MCHC 32.1 31.5 - 35.7 g/dL  ? RDW 13.0 11.7 - 15.4 %  ? Platelets 288 150 - 450 x10E3/uL  ? Neutrophils 48 Not Estab. %  ? Lymphs 40 Not Estab. %  ? Monocytes 8 Not Estab. %  ? Eos 4 Not Estab. %  ? Basos 0 Not Estab. %  ? Neutrophils Absolute 3.7 1.4 - 7.0 x10E3/uL  ? Lymphocytes Absolute 3.1 0.7 - 3.1 x10E3/uL  ? Monocytes Absolute 0.6 0.1 - 0.9 x10E3/uL  ? EOS (ABSOLUTE) 0.3 0.0 - 0.4 x10E3/uL  ? Basophils Absolute 0.0 0.0 - 0.2 x10E3/uL  ? Immature Granulocytes 0 Not Estab. %  ? Immature Grans (Abs) 0.0 0.0 - 0.1 x10E3/uL  ?Comprehensive metabolic panel  ?Result Value Ref Range  ? Glucose 87 70 - 99 mg/dL  ?  BUN 13 8 - 27 mg/dL  ? Creatinine, Ser 0.87 0.57 - 1.00 mg/dL  ? eGFR 73 >59 mL/min/1.73  ? BUN/Creatinine Ratio 15 12 - 28  ? Sodium 140 134 - 144 mmol/L  ? Potassium 4.0 3.5 - 5.2 mmol/L  ? Chloride 101 96 - 106 mmol/L  ? CO2 21 20 - 29 mmol/L  ? Calcium 9.2 8.7 - 10.3 mg/dL  ? Total Protein 7.0 6.0 - 8.5 g/dL  ? Albumin 4.2 3.8 - 4.8 g/dL  ? Globulin, Total 2.8 1.5 - 4.5 g/dL  ? Albumin/Globulin Ratio 1.5 1.2 - 2.2  ? Bilirubin Total <0.2 0.0 - 1.2 mg/dL  ? Alkaline Phosphatase 92 44 - 121 IU/L  ? AST 13 0 - 40 IU/L  ? ALT 12 0 - 32 IU/L  ?Lipid panel  ?Result Value Ref Range  ? Cholesterol, Total 143 100 - 199 mg/dL  ? Triglycerides 94 0 - 149 mg/dL  ? HDL 70 >39 mg/dL  ? VLDL Cholesterol Cal 17 5 - 40 mg/dL  ? LDL Chol Calc (NIH) 56 0 - 99 mg/dL  ? Chol/HDL Ratio 2.0 0.0 - 4.4 ratio  ?TSH  ?Result Value Ref Range  ? TSH 1.470 0.450 - 4.500 uIU/mL  ?Urinalysis, Routine w reflex microscopic  ?Result Value Ref Range  ? Specific Gravity, UA 1.020 1.005 - 1.030  ? pH, UA 6.5 5.0 - 7.5  ? Color, UA Yellow Yellow  ? Appearance Ur Clear Clear  ? Leukocytes,UA Negative Negative  ? Protein,UA Negative Negative/Trace  ? Glucose, UA Negative Negative  ? Ketones, UA Negative  Negative  ? RBC, UA Trace (A) Negative  ? Bilirubin, UA Negative Negative  ? Urobilinogen, Ur 0.2 0.2 - 1.0 mg/dL  ? Nitrite, UA Negative Negative  ? Microscopic Examination See below:   ? ?   ? ? ?Current Outpatient Medications:  ??  aspirin 81 MG tablet, Take 81 mg by mouth daily., Disp: , Rfl:  ??  atorvastatin (LIPITOR) 10 MG tablet, Take 1 tablet (10 mg total) by mouth daily., Disp: 90 tablet, Rfl: 1 ??  clotrimazole-betamethasone (LOTRISONE) cream, Apply 1 application. topically daily., Disp: 30 g, Rfl: 0 ??  hydrochlorothiazide (HYDRODIURIL) 25 MG tablet, Take 1 tablet (25 mg total) by mouth daily., Disp: 90 tablet, Rfl: 1 ??  lisinopril (ZESTRIL) 40 MG tablet, Take 1 tablet (40 mg total) by mouth daily., Disp: 90 tablet, Rfl: 1 ??  nortriptyline (PAMELOR) 50 MG capsule, Take 1 capsule (50 mg total) by mouth at bedtime., Disp: 90 capsule, Rfl: 1  ? ? ?Assessment & Plan:  ?Rash under breast  ?Will start pt on clotrimazole for such  ?Will need to fu with pcp if doenst get better for further mx.  ?Pt is prediabetic.  ?Keep area dry and clean  ?Use powder as well as needed to absorb excessive moisture  ? ?Problem List Items Addressed This Visit   ? ?  ? Musculoskeletal and Integument  ? Rash - Primary  ?  ? ?No orders of the defined types were placed in this encounter. ?  ? ?Meds ordered this encounter  ?Medications  ?? clotrimazole-betamethasone (LOTRISONE) cream  ?  Sig: Apply 1 application. topically daily.  ?  Dispense:  30 g  ?  Refill:  0  ?  ? ?Follow up plan: ?No follow-ups on file. ? ?

## 2021-06-23 LAB — COLOGUARD: COLOGUARD: NEGATIVE

## 2021-06-23 NOTE — Progress Notes (Signed)
Contacted via [place] ? ? ?Cologuard has returned negative, repeat in 3 years:)

## 2021-06-27 ENCOUNTER — Telehealth: Payer: Self-pay | Admitting: Nurse Practitioner

## 2021-06-27 NOTE — Telephone Encounter (Signed)
Copied from CRM [id number]. Topic: General - Other ?>> Jun 27, 2021  4:28 PM Pawlus, Maxine Glenn A wrote: ?Reason for CRM: Pt called in to go over her latest lab results / cologuard results, please advise. ?

## 2021-06-28 NOTE — Telephone Encounter (Signed)
Called and notified patient of results via result note in the chart.  ?

## 2021-07-04 ENCOUNTER — Ambulatory Visit
Admission: RE | Admit: 2021-07-04 | Discharge: 2021-07-04 | Disposition: A | Discharge status: Home / Self Care | Payer: 59 | Source: Ambulatory Visit | Attending: Nurse Practitioner | Admitting: Nurse Practitioner

## 2021-07-04 DIAGNOSIS — Z13820 Encounter for screening for osteoporosis: Secondary | ICD-10-CM | POA: Insufficient documentation

## 2021-07-04 DIAGNOSIS — Z1231 Encounter for screening mammogram for malignant neoplasm of breast: Secondary | ICD-10-CM | POA: Insufficient documentation

## 2021-07-04 NOTE — Progress Notes (Signed)
Please let patient know her Mammogram did not show any evidence of a malignancy.  The recommendation is to repeat the Mammogram in 1 year.  

## 2021-07-04 NOTE — Progress Notes (Signed)
Hi Ms. Sakeena. Your Bone Density scan was normal.  No need for treatment at this time.

## 2021-07-30 ENCOUNTER — Ambulatory Visit: Payer: 59 | Admitting: Licensed Clinical Social Worker

## 2021-07-30 DIAGNOSIS — N183 Chronic kidney disease, stage 3 unspecified: Secondary | ICD-10-CM

## 2021-07-30 DIAGNOSIS — F322 Major depressive disorder, single episode, severe without psychotic features: Secondary | ICD-10-CM

## 2021-08-03 NOTE — Chronic Care Management (AMB) (Signed)
    Clinical Social Work  Care Management   Phone Outreach    08/03/2021 Name: Ashley Mcknight MRN: [id number] DOB: 12-08-1950  [name] is a 71 y.o. year old female who is a primary care patient of Larae Grooms, NP .   Reason for referral: Mental Health Counseling and Resources.    F/U phone call today to assess needs, progress and barriers with care plan goals.   Pt was unable to keep phone appointment today and requested to reschedule.  Plan: CCM LCSW will contact patient on next Tuesday, May 23, 23  Review of patient status, including review of consultants reports, relevant laboratory and other test results, and collaboration with appropriate care team members and the patient's provider was performed as part of comprehensive patient evaluation and provision of care management services.     Jenel Lucks, MSW, LCSW [facility] Care Management [place]  [facility] [email or web address] Phone [phone] 8:50 AM

## 2021-08-06 ENCOUNTER — Other Ambulatory Visit: Payer: Self-pay | Admitting: Nurse Practitioner

## 2021-08-07 NOTE — Telephone Encounter (Signed)
Med is not on current med profile, sent via Interface. LRF 02/06/20. Requested Prescriptions  Pending Prescriptions Disp Refills  . gabapentin (NEURONTIN) 400 MG capsule [Pharmacy Med Name: GABAPENTIN 400 MG CAPSULE] 30 capsule 11    Sig: TAKE 1 CAPSULE (400 MG TOTAL) BY MOUTH AT BEDTIME.     Neurology: Anticonvulsants - gabapentin Passed - 08/06/2021  9:43 AM      Passed - Cr in normal range and within 360 days    Creatinine  Date Value Ref Range Status  11/15/2013 0.90 0.60 - 1.30 mg/dL Final   Creatinine, Ser  Date Value Ref Range Status  05/17/2021 0.87 0.57 - 1.00 mg/dL Final         Passed - Completed PHQ-2 or PHQ-9 in the last 360 days      Passed - Valid encounter within last 12 months    Recent Outpatient Visits          1 month ago Rash   [facility] Vigg, Avanti, MD   2 months ago Annual physical exam   [facility] Larae Grooms, NP   5 months ago Primary hypertension   [facility] Larae Grooms, NP   6 months ago Primary hypertension   [facility] Larae Grooms, NP   7 months ago Depression, major, single episode, severe [facility])   [facility] Larae Grooms, NP      Future Appointments            In 3 months Larae Grooms, NP [facility]

## 2021-09-06 ENCOUNTER — Ambulatory Visit: Payer: Self-pay | Admitting: *Deleted

## 2021-09-06 NOTE — Telephone Encounter (Signed)
FYI to provider seeing the patient tomorrow.

## 2021-09-06 NOTE — Telephone Encounter (Signed)
Reason for Disposition  [1] MODERATE vomiting (e.g., 3 - 5 times/day) AND [2] age > 60 years  Answer Assessment - Initial Assessment Questions 1. VOMITING SEVERITY: "How many times have you vomited in the past 24 hours?"     - MILD:  1 - 2 times/day    - MODERATE: 3 - 5 times/day, decreased oral intake without significant weight loss or symptoms of dehydration    - SEVERE: 6 or more times/day, vomits everything or nearly everything, with significant weight loss, symptoms of dehydration      moderate 2. ONSET: "When did the vomiting begin?"      today 3. FLUIDS: "What fluids or food have you vomited up today?" "Have you been able to keep any fluids down?"     Patient is keeping water down- but vomiting yellow stomach fluid 4. ABDOMINAL PAIN: "Are your having any abdominal pain?" If yes : "How bad is it and what does it feel like?" (e.g., crampy, dull, intermittent, constant)      Cramping, feels like she has to use bathroom, lower midline 5. DIARRHEA: "Is there any diarrhea?" If Yes, ask: "How many times today?"      no 6. CONTACTS: "Is there anyone else in the family with the same symptoms?"      no 7. CAUSE: "What do you think is causing your vomiting?"     unsure 8. HYDRATION STATUS: "Any signs of dehydration?" (e.g., dry mouth [not only dry lips], too weak to stand) "When did you last urinate?"     no 9. OTHER SYMPTOMS: "Do you have any other symptoms?" (e.g., fever, headache, vertigo, vomiting blood or coffee grounds, recent head injury)     no 10. PREGNANCY: "Is there any chance you are pregnant?" "When was your last menstrual period?"  Protocols used: Vomiting-A-AH

## 2021-09-06 NOTE — Telephone Encounter (Signed)
  Chief Complaint: vomiting Symptoms: vomiting, stomach cramping Frequency: started today Pertinent Negatives: Patient denies  fever, headache, vertigo, vomiting blood or coffee grounds, recent head injury) Disposition: [] ED /[] Urgent Care (no appt availability in office) / [] Appointment(In office/virtual)/ []  [place] [facility]/ [] Home Care/ [x] Refused Recommended Disposition /[] [place] [facility]/ []  Follow-up with PCP Additional Notes: Patient has been scheduled for tomorrow- she states since her husband passed away she can't go to the hospital. Patient advised if she gets worse before her appointment tomorrow- she needs to go.

## 2021-09-07 ENCOUNTER — Ambulatory Visit: Payer: 59 | Admitting: Physician Assistant

## 2021-10-19 ENCOUNTER — Other Ambulatory Visit: Payer: Self-pay | Admitting: Nurse Practitioner

## 2021-10-19 MED ORDER — HYDROCHLOROTHIAZIDE 25 MG PO TABS: 25.0000 mg | ORAL_TABLET | Freq: Every day | ORAL | 0 refills | Status: DC

## 2021-10-19 NOTE — Telephone Encounter (Signed)
Requested Prescriptions  Pending Prescriptions Disp Refills  . hydrochlorothiazide (HYDRODIURIL) 25 MG tablet 90 tablet 1    Sig: Take 1 tablet (25 mg total) by mouth daily.     Cardiovascular: Diuretics - Thiazide Passed - 10/19/2021  9:44 AM      Passed - Cr in normal range and within 180 days    Creatinine  Date Value Ref Range Status  11/15/2013 0.90 0.60 - 1.30 mg/dL Final   Creatinine, Ser  Date Value Ref Range Status  05/17/2021 0.87 0.57 - 1.00 mg/dL Final         Passed - K in normal range and within 180 days    Potassium  Date Value Ref Range Status  05/17/2021 4.0 3.5 - 5.2 mmol/L Final  11/15/2013 3.3 (L) 3.5 - 5.1 mmol/L Final         Passed - Na in normal range and within 180 days    Sodium  Date Value Ref Range Status  05/17/2021 140 134 - 144 mmol/L Final  11/15/2013 140 136 - 145 mmol/L Final         Passed - Last BP in normal range    BP Readings from Last 1 Encounters:  06/14/21 130/77         Passed - Valid encounter within last 6 months    Recent Outpatient Visits          4 months ago Rash   [facility] Vigg, Avanti, MD   5 months ago Annual physical exam   [facility] Larae Grooms, NP   8 months ago Primary hypertension   [facility] Larae Grooms, NP   9 months ago Primary hypertension   [facility] Larae Grooms, NP   10 months ago Depression, major, single episode, severe [facility])   [facility] Larae Grooms, NP      Future Appointments            In 4 weeks Larae Grooms, NP [facility], [facility]

## 2021-10-19 NOTE — Telephone Encounter (Signed)
Medication Refill - Medication: hydrochlorothiazide (HYDRODIURIL) 25 MG tablet  Has the patient contacted their pharmacy? Yes.   Pt told to contact provider  Preferred Pharmacy (with phone number or street name):  [id number] - [place], [place] [address] Phone:  [phone]  Fax:  [phone]     Has the patient been seen for an appointment in the last year OR does the patient have an upcoming appointment? Yes.    Agent: Please be advised that RX refills may take up to 3 business days. We ask that you follow-up with your pharmacy.

## 2021-11-15 NOTE — Progress Notes (Deleted)
LMP  (LMP Unknown)    Subjective:    Patient ID: Margarette Canada, female    DOB: [birth year], 71 y.o.   MRN: [id number]  HPI: Zain Bingman is a 71 y.o. female  No chief complaint on file.  Patient states she is having a difficult time sleeping.  After last visit she was started on the Nortriptyline for sleep and depression.  States hasn't helped her.    HYPERTENSION Hypertension status: controlled  Satisfied with current treatment? no Duration of hypertension: years BP monitoring frequency:  not checking BP range:  BP medication side effects:  no Medication compliance: excellent compliance Previous BP meds:HCTZ and lisinopril Aspirin: no Recurrent headaches: no Visual changes: no Palpitations: no Dyspnea: no Chest pain: no Lower extremity edema: no Dizzy/lightheaded: no  CHRONIC KIDNEY DISEASE CKD status: controlled Medications renally dose: yes Previous renal evaluation: no Pneumovax:   will get at her next visit Influenza Vaccine:  Up to Date  DEPRESSION Patient states the Nortripyline is working to help her sleep.  She is starting to take care of herself and is feeling better.   [place] Visit from 06/14/2021 in [place]  PHQ-9 Total Score 0        06/14/2021   11:05 AM 05/17/2021    1:53 PM 02/16/2021    1:32 PM 12/23/2019    8:43 AM  GAD 7 : Generalized Anxiety Score  Nervous, Anxious, on Edge 0 0 0 0  Control/stop worrying 0 0 0 3  Worry too much - different things 0 0 0 3  Trouble relaxing 0 0 0 3  Restless 0 0 0 0  Easily annoyed or irritable 0 0 0 1  Afraid - awful might happen 0 0 0 1  Total GAD 7 Score 0 0 0 11  Anxiety Difficulty Not difficult at all Not difficult at all  Not difficult at all       Relevant past medical, surgical, family and social history reviewed and updated as indicated. Interim medical history since our last visit reviewed. Allergies and medications reviewed and updated.  Review of  Systems  Eyes:  Negative for visual disturbance.  Respiratory:  Negative for cough, chest tightness and shortness of breath.   Cardiovascular:  Negative for chest pain, palpitations and leg swelling.  Neurological:  Negative for dizziness and headaches.  Psychiatric/Behavioral:  Negative for dysphoric mood, sleep disturbance and suicidal ideas.    Per HPI unless specifically indicated above     Objective:    LMP  (LMP Unknown)   Wt Readings from Last 3 Encounters:  06/14/21 223 lb 6.4 oz (101.3 kg)  05/17/21 222 lb 3.2 oz (100.8 kg)  02/16/21 217 lb 8 oz (98.7 kg)    Physical Exam Vitals and nursing note reviewed.  Constitutional:      General: She is not in acute distress.    Appearance: Normal appearance. She is obese. She is not ill-appearing, toxic-appearing or diaphoretic.  HENT:     Head: Normocephalic.     Right Ear: External ear normal.     Left Ear: External ear normal.     Nose: Nose normal.     Mouth/Throat:     Mouth: Mucous membranes are moist.     Pharynx: Oropharynx is clear.  Eyes:     General:        Right eye: No discharge.        Left eye: No discharge.     Extraocular  Movements: Extraocular movements intact.     Conjunctiva/sclera: Conjunctivae normal.     Pupils: Pupils are equal, round, and reactive to light.  Cardiovascular:     Rate and Rhythm: Normal rate and regular rhythm.     Heart sounds: No murmur heard. Pulmonary:     Effort: Pulmonary effort is normal. No respiratory distress.     Breath sounds: Normal breath sounds. No wheezing or rales.  Musculoskeletal:     Cervical back: Normal range of motion and neck supple.  Skin:    General: Skin is warm and dry.     Capillary Refill: Capillary refill takes less than 2 seconds.  Neurological:     General: No focal deficit present.     Mental Status: She is alert and oriented to person, place, and time. Mental status is at baseline.  Psychiatric:        Mood and Affect: Mood normal.         Behavior: Behavior normal.        Thought Content: Thought content normal.        Judgment: Judgment normal.     Comments: Crying during exam.    Results for orders placed or performed in visit on 05/17/21  Microscopic Examination   Urine  Result Value Ref Range   WBC, UA None seen 0 - 5 /hpf   RBC, Urine 0-2 0 - 2 /hpf   Epithelial Cells (non renal) 0-10 0 - 10 /hpf   Bacteria, UA None seen None seen/Few  HgB A1c  Result Value Ref Range   Hgb A1c MFr Bld 6.4 (H) 4.8 - 5.6 %   Est. average glucose Bld gHb Est-mCnc 137 mg/dL  CBC with Differential/Platelet  Result Value Ref Range   WBC 7.8 3.4 - 10.8 x10E3/uL   RBC 4.71 3.77 - 5.28 x10E6/uL   Hemoglobin 12.3 11.1 - 15.9 g/dL   Hematocrit 38.3 34.0 - 46.6 %   MCV 81 79 - 97 fL   MCH 26.1 (L) 26.6 - 33.0 pg   MCHC 32.1 31.5 - 35.7 g/dL   RDW 13.0 11.7 - 15.4 %   Platelets 288 150 - 450 x10E3/uL   Neutrophils 48 Not Estab. %   Lymphs 40 Not Estab. %   Monocytes 8 Not Estab. %   Eos 4 Not Estab. %   Basos 0 Not Estab. %   Neutrophils Absolute 3.7 1.4 - 7.0 x10E3/uL   Lymphocytes Absolute 3.1 0.7 - 3.1 x10E3/uL   Monocytes Absolute 0.6 0.1 - 0.9 x10E3/uL   EOS (ABSOLUTE) 0.3 0.0 - 0.4 x10E3/uL   Basophils Absolute 0.0 0.0 - 0.2 x10E3/uL   Immature Granulocytes 0 Not Estab. %   Immature Grans (Abs) 0.0 0.0 - 0.1 x10E3/uL  Comprehensive metabolic panel  Result Value Ref Range   Glucose 87 70 - 99 mg/dL   BUN 13 8 - 27 mg/dL   Creatinine, Ser 0.87 0.57 - 1.00 mg/dL   eGFR 73 >59 mL/min/1.73   BUN/Creatinine Ratio 15 12 - 28   Sodium 140 134 - 144 mmol/L   Potassium 4.0 3.5 - 5.2 mmol/L   Chloride 101 96 - 106 mmol/L   CO2 21 20 - 29 mmol/L   Calcium 9.2 8.7 - 10.3 mg/dL   Total Protein 7.0 6.0 - 8.5 g/dL   Albumin 4.2 3.8 - 4.8 g/dL   Globulin, Total 2.8 1.5 - 4.5 g/dL   Albumin/Globulin Ratio 1.5 1.2 - 2.2   Bilirubin Total <0.2 0.0 -   1.2 mg/dL   Alkaline Phosphatase 92 44 - 121 IU/L   AST 13 0 - 40 IU/L   ALT 12 0  - 32 IU/L  Lipid panel  Result Value Ref Range   Cholesterol, Total 143 100 - 199 mg/dL   Triglycerides 94 0 - 149 mg/dL   HDL 70 >39 mg/dL   VLDL Cholesterol Cal 17 5 - 40 mg/dL   LDL Chol Calc (NIH) 56 0 - 99 mg/dL   Chol/HDL Ratio 2.0 0.0 - 4.4 ratio  TSH  Result Value Ref Range   TSH 1.470 0.450 - 4.500 uIU/mL  Urinalysis, Routine w reflex microscopic  Result Value Ref Range   Specific Gravity, UA 1.020 1.005 - 1.030   pH, UA 6.5 5.0 - 7.5   Color, UA Yellow Yellow   Appearance Ur Clear Clear   Leukocytes,UA Negative Negative   Protein,UA Negative Negative/Trace   Glucose, UA Negative Negative   Ketones, UA Negative Negative   RBC, UA Trace (A) Negative   Bilirubin, UA Negative Negative   Urobilinogen, Ur 0.2 0.2 - 1.0 mg/dL   Nitrite, UA Negative Negative   Microscopic Examination See below:   Cologuard  Result Value Ref Range   COLOGUARD Negative Negative      Assessment & Plan:   Problem List Items Addressed This Visit      Cardiovascular and Mediastinum   Hypertension - Primary     Endocrine   Impaired fasting glucose     Genitourinary   CKD (chronic kidney disease) stage 3, GFR 30-59 ml/min (HCC)     Other   Hyperlipemia   Depression, major, single episode, severe (HCC)     Follow up plan: No follow-ups on file.       

## 2021-11-16 ENCOUNTER — Ambulatory Visit: Payer: 59 | Admitting: Nurse Practitioner

## 2021-11-16 DIAGNOSIS — E785 Hyperlipidemia, unspecified: Secondary | ICD-10-CM

## 2021-11-16 DIAGNOSIS — R7301 Impaired fasting glucose: Secondary | ICD-10-CM

## 2021-11-16 DIAGNOSIS — F322 Major depressive disorder, single episode, severe without psychotic features: Secondary | ICD-10-CM

## 2021-11-16 DIAGNOSIS — I10 Essential (primary) hypertension: Secondary | ICD-10-CM

## 2021-11-16 DIAGNOSIS — N183 Chronic kidney disease, stage 3 unspecified: Secondary | ICD-10-CM

## 2021-12-04 ENCOUNTER — Other Ambulatory Visit: Payer: Self-pay | Admitting: Nurse Practitioner

## 2021-12-04 NOTE — Telephone Encounter (Signed)
Patient will need a follow up appointment prior to next refill. Requested Prescriptions  Pending Prescriptions Disp Refills  . lisinopril (ZESTRIL) 40 MG tablet [Pharmacy Med Name: LISINOPRIL 40 MG TABLET] 90 tablet 0    Sig: TAKE 1 TABLET BY MOUTH EVERY DAY     Cardiovascular:  ACE Inhibitors Failed - 12/04/2021  2:15 AM      Failed - Cr in normal range and within 180 days    Creatinine  Date Value Ref Range Status  11/15/2013 0.90 0.60 - 1.30 mg/dL Final   Creatinine, Ser  Date Value Ref Range Status  05/17/2021 0.87 0.57 - 1.00 mg/dL Final         Failed - K in normal range and within 180 days    Potassium  Date Value Ref Range Status  05/17/2021 4.0 3.5 - 5.2 mmol/L Final  11/15/2013 3.3 (L) 3.5 - 5.1 mmol/L Final         Passed - Patient is not pregnant      Passed - Last BP in normal range    BP Readings from Last 1 Encounters:  06/14/21 130/77         Passed - Valid encounter within last 6 months    Recent Outpatient Visits          5 months ago [place], MD   6 months ago Annual physical exam   [facility] Jon Billings, NP   9 months ago Primary hypertension   [facility] Jon Billings, NP   10 months ago Primary hypertension   [facility] Jon Billings, NP   11 months ago Depression, major, single episode, severe [facility])   [facility] Jon Billings, NP

## 2022-01-14 ENCOUNTER — Encounter (INDEPENDENT_AMBULATORY_CARE_PROVIDER_SITE_OTHER): Payer: Self-pay

## 2022-01-17 ENCOUNTER — Other Ambulatory Visit: Payer: Self-pay | Admitting: Nurse Practitioner

## 2022-01-17 NOTE — Telephone Encounter (Signed)
Requested Prescriptions  Pending Prescriptions Disp Refills   atorvastatin (LIPITOR) 10 MG tablet [Pharmacy Med Name: ATORVASTATIN 10 MG TABLET] 90 tablet 0    Sig: TAKE 1 TABLET BY MOUTH EVERY DAY     Cardiovascular:  Antilipid - Statins Failed - 01/17/2022  2:19 AM      Failed - Lipid Panel in normal range within the last 12 months    Cholesterol, Total  Date Value Ref Range Status  05/17/2021 143 100 - 199 mg/dL Final   Cholesterol Piccolo, Waived  Date Value Ref Range Status  10/03/2014 144 <200 mg/dL Final    Comment:                            Desirable                <200                         Borderline High      200- 239                         High                     >239    LDL Chol Calc (NIH)  Date Value Ref Range Status  05/17/2021 56 0 - 99 mg/dL Final   HDL  Date Value Ref Range Status  05/17/2021 70 >39 mg/dL Final   Triglycerides  Date Value Ref Range Status  05/17/2021 94 0 - 149 mg/dL Final   Triglycerides Piccolo,Waived  Date Value Ref Range Status  10/03/2014 73 <150 mg/dL Final    Comment:                            Normal                   <150                         Borderline High     150 - 199                         High                200 - 499                         Very High                >499          Passed - Patient is not pregnant      Passed - Valid encounter within last 12 months    Recent Outpatient Visits           7 months ago [place] Vigg, Avanti, MD   8 months ago Annual physical exam   [facility] Jon Billings, NP   11 months ago Primary hypertension   [facility] Jon Billings, NP   1 year ago Primary hypertension   [facility] Jon Billings, NP   1 year ago Depression, major, single episode, severe ([place])   [facility] Jon Billings, NP

## 2022-01-19 ENCOUNTER — Other Ambulatory Visit: Payer: Self-pay | Admitting: Nurse Practitioner

## 2022-01-21 NOTE — Telephone Encounter (Signed)
Courtesy refill. Patient will need an office visit for further refills. Requested Prescriptions  Pending Prescriptions Disp Refills   hydrochlorothiazide (HYDRODIURIL) 25 MG tablet [Pharmacy Med Name: HYDROCHLOROTHIAZIDE 25 MG TAB] 90 tablet 0    Sig: TAKE 1 TABLET (25 MG TOTAL) BY MOUTH DAILY.     Cardiovascular: Diuretics - Thiazide Failed - 01/19/2022  1:10 AM      Failed - Cr in normal range and within 180 days    Creatinine  Date Value Ref Range Status  11/15/2013 0.90 0.60 - 1.30 mg/dL Final   Creatinine, Ser  Date Value Ref Range Status  05/17/2021 0.87 0.57 - 1.00 mg/dL Final         Failed - K in normal range and within 180 days    Potassium  Date Value Ref Range Status  05/17/2021 4.0 3.5 - 5.2 mmol/L Final  11/15/2013 3.3 (L) 3.5 - 5.1 mmol/L Final         Failed - Na in normal range and within 180 days    Sodium  Date Value Ref Range Status  05/17/2021 140 134 - 144 mmol/L Final  11/15/2013 140 136 - 145 mmol/L Final         Failed - Valid encounter within last 6 months    Recent Outpatient Visits           7 months ago [place] Vigg, Avanti, MD   8 months ago Annual physical exam   [facility] Jon Billings, NP   11 months ago Primary hypertension   [facility] Jon Billings, NP   1 year ago Primary hypertension   [facility] Jon Billings, NP   1 year ago Depression, major, single episode, severe ([place])   [place], NP              Passed - Last BP in normal range    BP Readings from Last 1 Encounters:  06/14/21 130/77

## 2022-01-21 NOTE — Telephone Encounter (Signed)
Called pt - Pt was driving she will call back to make an appt.

## 2022-02-12 ENCOUNTER — Other Ambulatory Visit: Payer: Self-pay | Admitting: Nurse Practitioner

## 2022-02-12 NOTE — Telephone Encounter (Signed)
Requested medications are due for refill today.  yes  Requested medications are on the active medications list.  yes  Last refill. 01/21/2022 #30 0 rf  Future visit scheduled.   no  Notes to clinic.  Pt already given a courtesy refill.    Requested Prescriptions  Pending Prescriptions Disp Refills   hydrochlorothiazide (HYDRODIURIL) 25 MG tablet [Pharmacy Med Name: HYDROCHLOROTHIAZIDE 25 MG TAB] 90 tablet 1    Sig: TAKE 1 TABLET (25 MG TOTAL) BY MOUTH DAILY.     Cardiovascular: Diuretics - Thiazide Failed - 02/12/2022 10:32 AM      Failed - Cr in normal range and within 180 days    Creatinine  Date Value Ref Range Status  11/15/2013 0.90 0.60 - 1.30 mg/dL Final   Creatinine, Ser  Date Value Ref Range Status  05/17/2021 0.87 0.57 - 1.00 mg/dL Final         Failed - K in normal range and within 180 days    Potassium  Date Value Ref Range Status  05/17/2021 4.0 3.5 - 5.2 mmol/L Final  11/15/2013 3.3 (L) 3.5 - 5.1 mmol/L Final         Failed - Na in normal range and within 180 days    Sodium  Date Value Ref Range Status  05/17/2021 140 134 - 144 mmol/L Final  11/15/2013 140 136 - 145 mmol/L Final         Failed - Valid encounter within last 6 months    Recent Outpatient Visits           8 months ago [place]   [facility] Vigg, Avanti, MD   9 months ago Annual physical exam   [facility] Larae Grooms, NP   12 months ago Primary hypertension   [facility] Larae Grooms, NP   1 year ago Primary hypertension   [facility] Larae Grooms, NP   1 year ago Depression, major, single episode, severe (HCC)   [facility] Larae Grooms, NP              Passed - Last BP in normal range    BP Readings from Last 1 Encounters:  06/14/21 130/77

## 2022-02-16 ENCOUNTER — Other Ambulatory Visit: Payer: Self-pay | Admitting: Nurse Practitioner

## 2022-02-18 NOTE — Telephone Encounter (Signed)
Requested medication (s) are due for refill today: yes  Requested medication (s) are on the active medication list: yes  Last refill:  01/21/22 #30/0  Future visit scheduled: yes scheduled for 03/07/22  Notes to clinic: Unable to refill per protocol due to failed labs, no updated results.    Requested Prescriptions  Pending Prescriptions Disp Refills   hydrochlorothiazide (HYDRODIURIL) 25 MG tablet [Pharmacy Med Name: HYDROCHLOROTHIAZIDE 25 MG TAB] 30 tablet 0    Sig: TAKE 1 TABLET (25 MG TOTAL) BY MOUTH DAILY.     Cardiovascular: Diuretics - Thiazide Failed - 02/16/2022  1:29 PM      Failed - Cr in normal range and within 180 days    Creatinine  Date Value Ref Range Status  11/15/2013 0.90 0.60 - 1.30 mg/dL Final   Creatinine, Ser  Date Value Ref Range Status  05/17/2021 0.87 0.57 - 1.00 mg/dL Final         Failed - K in normal range and within 180 days    Potassium  Date Value Ref Range Status  05/17/2021 4.0 3.5 - 5.2 mmol/L Final  11/15/2013 3.3 (L) 3.5 - 5.1 mmol/L Final         Failed - Na in normal range and within 180 days    Sodium  Date Value Ref Range Status  05/17/2021 140 134 - 144 mmol/L Final  11/15/2013 140 136 - 145 mmol/L Final         Failed - Valid encounter within last 6 months    Recent Outpatient Visits           8 months ago Rash   [facility] Vigg, Avanti, MD   9 months ago Annual physical exam   [facility] Larae Grooms, NP   1 year ago Primary hypertension   [facility] Larae Grooms, NP   1 year ago Primary hypertension   [facility] Larae Grooms, NP   1 year ago Depression, major, single episode, severe (HCC)   [facility] Larae Grooms, NP       Future Appointments             In 2 weeks Larae Grooms, NP [facility]            Passed - Last BP in normal range    BP Readings from Last 1 Encounters:  06/14/21 130/77

## 2022-03-04 ENCOUNTER — Other Ambulatory Visit: Payer: Self-pay | Admitting: Nurse Practitioner

## 2022-03-05 NOTE — Telephone Encounter (Signed)
Requested medication (s) are due for refill today: routing for review  Requested medication (s) are on the active medication list:yes  Last refill:  multiple dates  Future visit scheduled: yes  Notes to clinic:  Unable to refill per protocol, courtesy refill already given, routing for provider approval.      Requested Prescriptions  Pending Prescriptions Disp Refills   lisinopril (ZESTRIL) 40 MG tablet [Pharmacy Med Name: LISINOPRIL 40 MG TABLET] 90 tablet 0    Sig: TAKE 1 TABLET BY MOUTH EVERY DAY     Cardiovascular:  ACE Inhibitors Failed - 03/04/2022 10:11 AM      Failed - Cr in normal range and within 180 days    Creatinine  Date Value Ref Range Status  11/15/2013 0.90 0.60 - 1.30 mg/dL Final   Creatinine, Ser  Date Value Ref Range Status  05/17/2021 0.87 0.57 - 1.00 mg/dL Final         Failed - K in normal range and within 180 days    Potassium  Date Value Ref Range Status  05/17/2021 4.0 3.5 - 5.2 mmol/L Final  11/15/2013 3.3 (L) 3.5 - 5.1 mmol/L Final         Failed - Valid encounter within last 6 months    Recent Outpatient Visits           8 months ago Rash   [facility] Vigg, Avanti, MD   9 months ago Annual physical exam   [facility] Larae Grooms, NP   1 year ago Primary hypertension   [facility] Larae Grooms, NP   1 year ago Primary hypertension   [facility] Larae Grooms, NP   1 year ago Depression, major, single episode, severe (HCC)   [facility] Larae Grooms, NP       Future Appointments             In 2 days Larae Grooms, NP [facility]            Passed - Patient is not pregnant      Passed - Last BP in normal range    BP Readings from Last 1 Encounters:  06/14/21 130/77          atorvastatin (LIPITOR) 10 MG tablet [Pharmacy Med Name: ATORVASTATIN 10 MG TABLET] 90 tablet 0    Sig: TAKE 1 TABLET BY MOUTH EVERY DAY      Cardiovascular:  Antilipid - Statins Failed - 03/04/2022 10:11 AM      Failed - Lipid Panel in normal range within the last 12 months    Cholesterol, Total  Date Value Ref Range Status  05/17/2021 143 100 - 199 mg/dL Final   Cholesterol Piccolo, Waived  Date Value Ref Range Status  10/03/2014 144 <200 mg/dL Final    Comment:                            Desirable                <200                         Borderline High      200- 239                         High                     >  239    LDL Chol Calc (NIH)  Date Value Ref Range Status  05/17/2021 56 0 - 99 mg/dL Final   HDL  Date Value Ref Range Status  05/17/2021 70 >39 mg/dL Final   Triglycerides  Date Value Ref Range Status  05/17/2021 94 0 - 149 mg/dL Final   Triglycerides Piccolo,Waived  Date Value Ref Range Status  10/03/2014 73 <150 mg/dL Final    Comment:                            Normal                   <150                         Borderline High     150 - 199                         High                200 - 499                         Very High                >499          Passed - Patient is not pregnant      Passed - Valid encounter within last 12 months    Recent Outpatient Visits           8 months ago [place] Vigg, Avanti, MD   9 months ago Annual physical exam   [facility] Jon Billings, NP   1 year ago Primary hypertension   [facility] Jon Billings, NP   1 year ago Primary hypertension   [facility] Jon Billings, NP   1 year ago Depression, major, single episode, severe ([place])   [place], Karen, NP       Future Appointments             In 2 days Jon Billings, NP [facility]             hydrochlorothiazide (HYDRODIURIL) 25 MG tablet [Pharmacy Med Name: HYDROCHLOROTHIAZIDE 25 MG TAB] 30 tablet 0    Sig: TAKE 1 TABLET (25 MG TOTAL) BY MOUTH DAILY.      Cardiovascular: Diuretics - Thiazide Failed - 03/04/2022 10:11 AM      Failed - Cr in normal range and within 180 days    Creatinine  Date Value Ref Range Status  11/15/2013 0.90 0.60 - 1.30 mg/dL Final   Creatinine, Ser  Date Value Ref Range Status  05/17/2021 0.87 0.57 - 1.00 mg/dL Final         Failed - K in normal range and within 180 days    Potassium  Date Value Ref Range Status  05/17/2021 4.0 3.5 - 5.2 mmol/L Final  11/15/2013 3.3 (L) 3.5 - 5.1 mmol/L Final         Failed - Na in normal range and within 180 days    Sodium  Date Value Ref Range Status  05/17/2021 140 134 - 144 mmol/L Final  11/15/2013 140 136 - 145 mmol/L Final         Failed - Valid encounter within last 6 months  Recent Outpatient Visits           8 months ago [place] Vigg, Avanti, MD   9 months ago Annual physical exam   [facility] Jon Billings, NP   1 year ago Primary hypertension   [facility] Jon Billings, NP   1 year ago Primary hypertension   [facility] Jon Billings, NP   1 year ago Depression, major, single episode, severe ([place])   [facility] Jon Billings, NP       Future Appointments             In 2 days Jon Billings, NP [facility]            Passed - Last BP in normal range    BP Readings from Last 1 Encounters:  06/14/21 130/77

## 2022-03-07 ENCOUNTER — Ambulatory Visit: Payer: 59 | Admitting: Nurse Practitioner

## 2022-03-07 NOTE — Progress Notes (Deleted)
LMP  (LMP Unknown)    Subjective:    Patient ID: Ashley Mcknight, female    DOB: May 27, 1950, 71 y.o.   MRN: [id number]  HPI: Ashley Mcknight is a 71 y.o. female  No chief complaint on file.  HYPERTENSION / HYPERLIPIDEMIA Satisfied with current treatment? {Blank single:19197::"yes","no"} Duration of hypertension: {Blank single:19197::"chronic","months","years"} BP monitoring frequency: {Blank single:19197::"not checking","rarely","daily","weekly","monthly","a few times a day","a few times a week","a few times a month"} BP range:  BP medication side effects: {Blank single:19197::"yes","no"} Past BP meds: {Blank NGEXBMWU:13244::"WNUU","VOZDGUYQIH","KVQQVZDGLO/VFIEPPIRJJ","OACZYSAY","TKZSWFUXNA","TFTDDUKGUR/KYHC","WCBJSEGBTD (bystolic)","carvedilol","chlorthalidone","clonidine","diltiazem","exforge HCT","HCTZ","irbesartan (avapro)","labetalol","lisinopril","lisinopril-HCTZ","losartan (cozaar)","methyldopa","nifedipine","olmesartan (benicar)","olmesartan-HCTZ","quinapril","ramipril","spironalactone","tekturna","valsartan","valsartan-HCTZ","verapamil"} Duration of hyperlipidemia: {Blank single:19197::"chronic","months","years"} Cholesterol medication side effects: {Blank single:19197::"yes","no"} Cholesterol supplements: {Blank multiple:19196::"none","fish oil","niacin","red yeast rice"} Past cholesterol medications: {Blank multiple:19196::"none","atorvastain (lipitor)","lovastatin (mevacor)","pravastatin (pravachol)","rosuvastatin (crestor)","simvastatin (zocor)","vytorin","fenofibrate (tricor)","gemfibrozil","ezetimide (zetia)","niaspan","lovaza"} Medication compliance: {Blank single:19197::"excellent compliance","good compliance","fair compliance","poor compliance"} Aspirin: {Blank single:19197::"yes","no"} Recent stressors: {Blank single:19197::"yes","no"} Recurrent headaches: {Blank single:19197::"yes","no"} Visual changes: {Blank single:19197::"yes","no"} Palpitations: {Blank  single:19197::"yes","no"} Dyspnea: {Blank single:19197::"yes","no"} Chest pain: {Blank single:19197::"yes","no"} Lower extremity edema: {Blank single:19197::"yes","no"} Dizzy/lightheaded: {Blank single:19197::"yes","no"}  CHRONIC KIDNEY DISEASE CKD status: {Blank single:19197::"controlled","uncontrolled","better","worse","exacerbated","stable"} Medications renally dose: {Blank single:19197::"yes","no"} Previous renal evaluation: {Blank single:19197::"yes","no"} Pneumovax:  {Blank single:19197::"Up to Date","Not up to Date","unknown"} Influenza Vaccine:  {Blank single:19197::"Up to Date","Not up to Date","unknown"}  Relevant past medical, surgical, family and social history reviewed and updated as indicated. Interim medical history since our last visit reviewed. Allergies and medications reviewed and updated.  Review of Systems  Per HPI unless specifically indicated above     Objective:    LMP  (LMP Unknown)   Wt Readings from Last 3 Encounters:  06/14/21 223 lb 6.4 oz (101.3 kg)  05/17/21 222 lb 3.2 oz (100.8 kg)  02/16/21 217 lb 8 oz (98.7 kg)    Physical Exam  Results for orders placed or performed in visit on 05/17/21  Microscopic Examination   Urine  Result Value Ref Range   WBC, UA None seen 0 - 5 /hpf   RBC, Urine 0-2 0 - 2 /hpf   Epithelial Cells (non renal) 0-10 0 - 10 /hpf   Bacteria, UA None seen None seen/Few  HgB A1c  Result Value Ref Range   Hgb A1c MFr Bld 6.4 (H) 4.8 - 5.6 %   Est. average glucose Bld gHb Est-mCnc 137 mg/dL  CBC with Differential/Platelet  Result Value Ref Range   WBC 7.8 3.4 - 10.8 x10E3/uL   RBC 4.71 3.77 - 5.28 x10E6/uL   Hemoglobin 12.3 11.1 - 15.9 g/dL   Hematocrit 38.3 34.0 - 46.6 %   MCV 81 79 - 97 fL   MCH 26.1 (L) 26.6 - 33.0 pg   MCHC 32.1 31.5 - 35.7 g/dL   RDW 13.0 11.7 - 15.4 %   Platelets 288 150 - 450 x10E3/uL   Neutrophils 48 Not Estab. %   Lymphs 40 Not Estab. %   Monocytes 8 Not Estab. %   Eos 4 Not Estab. %    Basos 0 Not Estab. %   Neutrophils Absolute 3.7 1.4 - 7.0 x10E3/uL   Lymphocytes Absolute 3.1 0.7 - 3.1 x10E3/uL   Monocytes Absolute 0.6 0.1 - 0.9 x10E3/uL   EOS (ABSOLUTE) 0.3 0.0 - 0.4 x10E3/uL   Basophils Absolute 0.0 0.0 - 0.2 x10E3/uL   Immature Granulocytes 0 Not Estab. %   Immature Grans (Abs) 0.0 0.0 - 0.1 x10E3/uL  Comprehensive metabolic panel  Result Value Ref Range   Glucose 87 70 - 99 mg/dL   BUN 13 8 - 27 mg/dL   Creatinine, Ser 0.87 0.57 - 1.00 mg/dL  eGFR 73 >59 mL/min/1.73   BUN/Creatinine Ratio 15 12 - 28   Sodium 140 134 - 144 mmol/L   Potassium 4.0 3.5 - 5.2 mmol/L   Chloride 101 96 - 106 mmol/L   CO2 21 20 - 29 mmol/L   Calcium 9.2 8.7 - 10.3 mg/dL   Total Protein 7.0 6.0 - 8.5 g/dL   Albumin 4.2 3.8 - 4.8 g/dL   Globulin, Total 2.8 1.5 - 4.5 g/dL   Albumin/Globulin Ratio 1.5 1.2 - 2.2   Bilirubin Total <0.2 0.0 - 1.2 mg/dL   Alkaline Phosphatase 92 44 - 121 IU/L   AST 13 0 - 40 IU/L   ALT 12 0 - 32 IU/L  Lipid panel  Result Value Ref Range   Cholesterol, Total 143 100 - 199 mg/dL   Triglycerides 94 0 - 149 mg/dL   HDL 70 >39 mg/dL   VLDL Cholesterol Cal 17 5 - 40 mg/dL   LDL Chol Calc (NIH) 56 0 - 99 mg/dL   Chol/HDL Ratio 2.0 0.0 - 4.4 ratio  TSH  Result Value Ref Range   TSH 1.470 0.450 - 4.500 uIU/mL  Urinalysis, Routine w reflex microscopic  Result Value Ref Range   Specific Gravity, UA 1.020 1.005 - 1.030   pH, UA 6.5 5.0 - 7.5   Color, UA Yellow Yellow   Appearance Ur Clear Clear   Leukocytes,UA Negative Negative   Protein,UA Negative Negative/Trace   Glucose, UA Negative Negative   Ketones, UA Negative Negative   RBC, UA Trace (A) Negative   Bilirubin, UA Negative Negative   Urobilinogen, Ur 0.2 0.2 - 1.0 mg/dL   Nitrite, UA Negative Negative   Microscopic Examination See below:   Cologuard  Result Value Ref Range   COLOGUARD Negative Negative      Assessment & Plan:   Problem List Items Addressed This Visit        Cardiovascular and Mediastinum   Hypertension - Primary     Endocrine   Impaired fasting glucose     Genitourinary   CKD (chronic kidney disease) stage 3, GFR 30-59 ml/min (HCC)     Other   Obesity   Hyperlipemia   Depression, major, single episode, severe (HCC)     Follow up plan: No follow-ups on file.

## 2022-03-20 NOTE — Progress Notes (Unsigned)
LMP  (LMP Unknown)    Subjective:    Patient ID: Ashley Mcknight, female    DOB: May 27, 1950, 72 y.o.   MRN: [id number]  HPI: Ashley Mcknight is a 72 y.o. female  No chief complaint on file.  HYPERTENSION / HYPERLIPIDEMIA Satisfied with current treatment? {Blank single:19197::"yes","no"} Duration of hypertension: {Blank single:19197::"chronic","months","years"} BP monitoring frequency: {Blank single:19197::"not checking","rarely","daily","weekly","monthly","a few times a day","a few times a week","a few times a month"} BP range:  BP medication side effects: {Blank single:19197::"yes","no"} Past BP meds: {Blank NGEXBMWU:13244::"WNUU","VOZDGUYQIH","KVQQVZDGLO/VFIEPPIRJJ","OACZYSAY","TKZSWFUXNA","TFTDDUKGUR/KYHC","WCBJSEGBTD (bystolic)","carvedilol","chlorthalidone","clonidine","diltiazem","exforge HCT","HCTZ","irbesartan (avapro)","labetalol","lisinopril","lisinopril-HCTZ","losartan (cozaar)","methyldopa","nifedipine","olmesartan (benicar)","olmesartan-HCTZ","quinapril","ramipril","spironalactone","tekturna","valsartan","valsartan-HCTZ","verapamil"} Duration of hyperlipidemia: {Blank single:19197::"chronic","months","years"} Cholesterol medication side effects: {Blank single:19197::"yes","no"} Cholesterol supplements: {Blank multiple:19196::"none","fish oil","niacin","red yeast rice"} Past cholesterol medications: {Blank multiple:19196::"none","atorvastain (lipitor)","lovastatin (mevacor)","pravastatin (pravachol)","rosuvastatin (crestor)","simvastatin (zocor)","vytorin","fenofibrate (tricor)","gemfibrozil","ezetimide (zetia)","niaspan","lovaza"} Medication compliance: {Blank single:19197::"excellent compliance","good compliance","fair compliance","poor compliance"} Aspirin: {Blank single:19197::"yes","no"} Recent stressors: {Blank single:19197::"yes","no"} Recurrent headaches: {Blank single:19197::"yes","no"} Visual changes: {Blank single:19197::"yes","no"} Palpitations: {Blank  single:19197::"yes","no"} Dyspnea: {Blank single:19197::"yes","no"} Chest pain: {Blank single:19197::"yes","no"} Lower extremity edema: {Blank single:19197::"yes","no"} Dizzy/lightheaded: {Blank single:19197::"yes","no"}  CHRONIC KIDNEY DISEASE CKD status: {Blank single:19197::"controlled","uncontrolled","better","worse","exacerbated","stable"} Medications renally dose: {Blank single:19197::"yes","no"} Previous renal evaluation: {Blank single:19197::"yes","no"} Pneumovax:  {Blank single:19197::"Up to Date","Not up to Date","unknown"} Influenza Vaccine:  {Blank single:19197::"Up to Date","Not up to Date","unknown"}  Relevant past medical, surgical, family and social history reviewed and updated as indicated. Interim medical history since our last visit reviewed. Allergies and medications reviewed and updated.  Review of Systems  Per HPI unless specifically indicated above     Objective:    LMP  (LMP Unknown)   Wt Readings from Last 3 Encounters:  06/14/21 223 lb 6.4 oz (101.3 kg)  05/17/21 222 lb 3.2 oz (100.8 kg)  02/16/21 217 lb 8 oz (98.7 kg)    Physical Exam  Results for orders placed or performed in visit on 05/17/21  Microscopic Examination   Urine  Result Value Ref Range   WBC, UA None seen 0 - 5 /hpf   RBC, Urine 0-2 0 - 2 /hpf   Epithelial Cells (non renal) 0-10 0 - 10 /hpf   Bacteria, UA None seen None seen/Few  HgB A1c  Result Value Ref Range   Hgb A1c MFr Bld 6.4 (H) 4.8 - 5.6 %   Est. average glucose Bld gHb Est-mCnc 137 mg/dL  CBC with Differential/Platelet  Result Value Ref Range   WBC 7.8 3.4 - 10.8 x10E3/uL   RBC 4.71 3.77 - 5.28 x10E6/uL   Hemoglobin 12.3 11.1 - 15.9 g/dL   Hematocrit 38.3 34.0 - 46.6 %   MCV 81 79 - 97 fL   MCH 26.1 (L) 26.6 - 33.0 pg   MCHC 32.1 31.5 - 35.7 g/dL   RDW 13.0 11.7 - 15.4 %   Platelets 288 150 - 450 x10E3/uL   Neutrophils 48 Not Estab. %   Lymphs 40 Not Estab. %   Monocytes 8 Not Estab. %   Eos 4 Not Estab. %    Basos 0 Not Estab. %   Neutrophils Absolute 3.7 1.4 - 7.0 x10E3/uL   Lymphocytes Absolute 3.1 0.7 - 3.1 x10E3/uL   Monocytes Absolute 0.6 0.1 - 0.9 x10E3/uL   EOS (ABSOLUTE) 0.3 0.0 - 0.4 x10E3/uL   Basophils Absolute 0.0 0.0 - 0.2 x10E3/uL   Immature Granulocytes 0 Not Estab. %   Immature Grans (Abs) 0.0 0.0 - 0.1 x10E3/uL  Comprehensive metabolic panel  Result Value Ref Range   Glucose 87 70 - 99 mg/dL   BUN 13 8 - 27 mg/dL   Creatinine, Ser 0.87 0.57 - 1.00 mg/dL  eGFR 73 >59 mL/min/1.73   BUN/Creatinine Ratio 15 12 - 28   Sodium 140 134 - 144 mmol/L   Potassium 4.0 3.5 - 5.2 mmol/L   Chloride 101 96 - 106 mmol/L   CO2 21 20 - 29 mmol/L   Calcium 9.2 8.7 - 10.3 mg/dL   Total Protein 7.0 6.0 - 8.5 g/dL   Albumin 4.2 3.8 - 4.8 g/dL   Globulin, Total 2.8 1.5 - 4.5 g/dL   Albumin/Globulin Ratio 1.5 1.2 - 2.2   Bilirubin Total <0.2 0.0 - 1.2 mg/dL   Alkaline Phosphatase 92 44 - 121 IU/L   AST 13 0 - 40 IU/L   ALT 12 0 - 32 IU/L  Lipid panel  Result Value Ref Range   Cholesterol, Total 143 100 - 199 mg/dL   Triglycerides 94 0 - 149 mg/dL   HDL 70 >39 mg/dL   VLDL Cholesterol Cal 17 5 - 40 mg/dL   LDL Chol Calc (NIH) 56 0 - 99 mg/dL   Chol/HDL Ratio 2.0 0.0 - 4.4 ratio  TSH  Result Value Ref Range   TSH 1.470 0.450 - 4.500 uIU/mL  Urinalysis, Routine w reflex microscopic  Result Value Ref Range   Specific Gravity, UA 1.020 1.005 - 1.030   pH, UA 6.5 5.0 - 7.5   Color, UA Yellow Yellow   Appearance Ur Clear Clear   Leukocytes,UA Negative Negative   Protein,UA Negative Negative/Trace   Glucose, UA Negative Negative   Ketones, UA Negative Negative   RBC, UA Trace (A) Negative   Bilirubin, UA Negative Negative   Urobilinogen, Ur 0.2 0.2 - 1.0 mg/dL   Nitrite, UA Negative Negative   Microscopic Examination See below:   Cologuard  Result Value Ref Range   COLOGUARD Negative Negative      Assessment & Plan:   Problem List Items Addressed This Visit       Cardiovascular and Mediastinum   Hypertension - Primary     Endocrine   Impaired fasting glucose     Genitourinary   CKD (chronic kidney disease) stage 3, GFR 30-59 ml/min (HCC)     Other   Obesity   Hyperlipemia   Insomnia   Depression, major, single episode, severe (HCC)     Follow up plan: No follow-ups on file.

## 2022-03-21 ENCOUNTER — Encounter: Payer: Self-pay | Admitting: Nurse Practitioner

## 2022-03-21 ENCOUNTER — Ambulatory Visit: Payer: 59 | Admitting: Nurse Practitioner

## 2022-03-21 VITALS — BP 121/73 | HR 101 | Temp 98.0°F | Ht 63.0 in | Wt 211.4 lb

## 2022-03-21 DIAGNOSIS — Z23 Encounter for immunization: Secondary | ICD-10-CM

## 2022-03-21 DIAGNOSIS — F322 Major depressive disorder, single episode, severe without psychotic features: Secondary | ICD-10-CM | POA: Diagnosis not present

## 2022-03-21 DIAGNOSIS — I10 Essential (primary) hypertension: Secondary | ICD-10-CM | POA: Diagnosis not present

## 2022-03-21 DIAGNOSIS — R7301 Impaired fasting glucose: Secondary | ICD-10-CM

## 2022-03-21 DIAGNOSIS — E785 Hyperlipidemia, unspecified: Secondary | ICD-10-CM

## 2022-03-21 DIAGNOSIS — G4700 Insomnia, unspecified: Secondary | ICD-10-CM

## 2022-03-21 DIAGNOSIS — N183 Chronic kidney disease, stage 3 unspecified: Secondary | ICD-10-CM | POA: Diagnosis not present

## 2022-03-21 DIAGNOSIS — Z6839 Body mass index (BMI) 39.0-39.9, adult: Secondary | ICD-10-CM

## 2022-03-21 MED ORDER — ATORVASTATIN CALCIUM 10 MG PO TABS: 10.0000 mg | ORAL_TABLET | Freq: Every day | ORAL | 1 refills | Status: DC

## 2022-03-21 MED ORDER — HYDROCHLOROTHIAZIDE 25 MG PO TABS: 25.0000 mg | ORAL_TABLET | Freq: Every day | ORAL | 1 refills | Status: DC

## 2022-03-21 MED ORDER — LISINOPRIL 40 MG PO TABS: 40.0000 mg | ORAL_TABLET | Freq: Every day | ORAL | 1 refills | Status: DC

## 2022-03-21 MED ORDER — NORTRIPTYLINE HCL 50 MG PO CAPS: 50.0000 mg | ORAL_CAPSULE | Freq: Every day | ORAL | 1 refills | Status: DC

## 2022-03-21 NOTE — Assessment & Plan Note (Signed)
Chronic.  Controlled.  Continue with current medication regimen.  Labs ordered today.  Return to clinic in 6 months for reevaluation.  Call sooner if concerns arise.  ? ?

## 2022-03-21 NOTE — Assessment & Plan Note (Signed)
Recommended eating smaller high protein, low fat meals more frequently and exercising 30 mins a day 5 times a week with a goal of 10-15lb weight loss in the next 3 months.  

## 2022-03-21 NOTE — Assessment & Plan Note (Signed)
Chronic.  Controlled.  Continue with current medication regimen of Nortriptyline.  Refills sent today.  Labs ordered today.  Return to clinic in 6 months for reevaluation.  Call sooner if concerns arise.

## 2022-03-21 NOTE — Assessment & Plan Note (Signed)
Chronic.  Controlled.  Continue with current medication regimen of Atorvastatin 10mg.  Refills sent today.  Labs ordered today.  Return to clinic in 6 months for reevaluation.  Call sooner if concerns arise.   

## 2022-03-21 NOTE — Assessment & Plan Note (Signed)
Chronic.  Controlled.  Continue with current medication regimen of Lisinopril and HCTZ.  Refills sent today.  Labs ordered today.  Return to clinic in 6 months for reevaluation.  Call sooner if concerns arise.   

## 2022-03-21 NOTE — Assessment & Plan Note (Signed)
Chronic.  Controlled.  Continue with current medication regimen on Nortriptyline.  Refills sent today.  Labs ordered today.  Return to clinic in 6 months for reevaluation.  Call sooner if concerns arise.

## 2022-03-22 LAB — HEMOGLOBIN A1C
Est. average glucose Bld gHb Est-mCnc: 131 mg/dL
Hgb A1c MFr Bld: 6.2 % — ABNORMAL HIGH (ref 4.8–5.6)

## 2022-03-22 LAB — COMPREHENSIVE METABOLIC PANEL
ALT: 16 IU/L (ref 0–32)
AST: 19 IU/L (ref 0–40)
Albumin/Globulin Ratio: 1.4 (ref 1.2–2.2)
Albumin: 4.3 g/dL (ref 3.9–4.9)
Alkaline Phosphatase: 86 IU/L (ref 44–121)
BUN/Creatinine Ratio: 14 (ref 12–28)
BUN: 13 mg/dL (ref 8–27)
Bilirubin Total: 0.4 mg/dL (ref 0.0–1.2)
CO2: 25 mmol/L (ref 20–29)
Calcium: 9.6 mg/dL (ref 8.7–10.3)
Chloride: 100 mmol/L (ref 96–106)
Creatinine, Ser: 0.94 mg/dL (ref 0.57–1.00)
Globulin, Total: 3.1 g/dL (ref 1.5–4.5)
Glucose: 125 mg/dL — ABNORMAL HIGH (ref 70–99)
Potassium: 3.8 mmol/L (ref 3.5–5.2)
Sodium: 140 mmol/L (ref 134–144)
Total Protein: 7.4 g/dL (ref 6.0–8.5)
eGFR: 66 mL/min/{1.73_m2} (ref >59)

## 2022-03-22 LAB — LIPID PANEL
Chol/HDL Ratio: 2.1 ratio (ref 0.0–4.4)
Cholesterol, Total: 143 mg/dL (ref 100–199)
HDL: 67 mg/dL (ref >39)
LDL Chol Calc (NIH): 61 mg/dL (ref 0–99)
Triglycerides: 76 mg/dL (ref 0–149)
VLDL Cholesterol Cal: 15 mg/dL (ref 5–40)

## 2022-03-22 NOTE — Progress Notes (Signed)
HI Ashley Mcknight. It was nice to see you yesterday.  Your lab work looks good.  No concerns at this time. Continue with your current medication regimen.  Follow up as discussed.  Please let me know if you have any questions.  

## 2022-04-15 ENCOUNTER — Other Ambulatory Visit: Payer: Self-pay

## 2022-04-15 MED ORDER — CLOTRIMAZOLE-BETAMETHASONE 1-0.05 % EX CREA: 1.0000 | TOPICAL_CREAM | Freq: Every day | CUTANEOUS | 0 refills | Status: DC

## 2022-05-14 ENCOUNTER — Ambulatory Visit: Payer: Self-pay | Admitting: *Deleted

## 2022-05-14 NOTE — Telephone Encounter (Signed)
Reason for Disposition  [1] MILD or MODERATE vomiting AND [2] present > 48 hours (2 days) (Exception: Mild vomiting with associated diarrhea.)  Answer Assessment - Initial Assessment Questions 1. VOMITING SEVERITY: "How many times have you vomited in the past 24 hours?"     - MILD:  1 - 2 times/day    - MODERATE: 3 - 5 times/day, decreased oral intake without significant weight loss or symptoms of dehydration    - SEVERE: 6 or more times/day, vomits everything or nearly everything, with significant weight loss, symptoms of dehydration      Vomiting for 3 days.   Even the smell of food makes me sick.   The first 2 days it was a lot.   Today I'm just gagging.    My throat is real sore and beside my navel is very sore.    No diarrhea.   No runny nose, coughing or congestion.   Same medications 2. ONSET: "When did the vomiting begin?"      Sunday 3. FLUIDS: "What fluids or food have you vomited up today?" "Have you been able to keep any fluids down?"     I can drink fluid.   I ate saltine crackers a few minutes and they are staying down.    I'm not feeling sick right now.    4. ABDOMEN PAIN: "Are your having any abdomen pain?" If Yes : "How bad is it and what does it feel like?" (e.g., crampy, dull, intermittent, constant)      I have cramping in my abd.    I'm having normal BMs. 5. DIARRHEA: "Is there any diarrhea?" If Yes, ask: "How many times today?"      No 6. CONTACTS: "Is there anyone else in the family with the same symptoms?"      At this point she had to answer her door.   Doorbell in the background heard.    Someone is there replacing the windshield on her car and it's starting to rain.    He needs her to follow him to the shop so he can complete replacing her windshield.    "I need to go".    I asked her to call us back so we could schedule her an appt.    She was agreeable to this and will call us back.    I encouraged her to drink plenty of fluids which she said she is doing.    Tried  Pepto Bismol and it's not helping much.   7. CAUSE: "What do you think is causing your vomiting?"     I don't know 8. HYDRATION STATUS: "Any signs of dehydration?" (e.g., dry mouth [not only dry lips], too weak to stand) "When did you last urinate?"     Unable to ask.   9. OTHER SYMPTOMS: "Do you have any other symptoms?" (e.g., fever, headache, vertigo, vomiting blood or coffee grounds, recent head injury)     See above    triage stopped before completed 10. PREGNANCY: "Is there any chance you are pregnant?" "When was your last menstrual period?"       N/A due to age  Protocols used: Vomiting-A-AH

## 2022-05-14 NOTE — Telephone Encounter (Signed)
  Chief Complaint: vomiting for 3 days Symptoms: Sore to side of her navel she thinks from vomiting so much.    Feels better today and ha kept down saltine crackers so far.   Drinking plenty of fluids. Frequency: Started Sunday Pertinent Negatives: Patient denies diarrhea or other symptoms other than a sore throat from vomiting so much. Disposition: []$ ED /[]$ Urgent Care (no appt availability in office) / [x]$ Appointment(In office/virtual)/ []$  [facility]/ []$ Home Care/ []$ Refused Recommended Disposition /[]$ [facility]/ []$  Follow-up with PCP Additional Notes: She had to go so she is going to call us back today to make an appt.    See triage notes for details.

## 2022-05-16 ENCOUNTER — Ambulatory Visit: Payer: 59 | Admitting: Nurse Practitioner

## 2022-06-01 IMAGING — MG MM DIGITAL SCREENING BILAT W/ TOMO AND CAD
8 series · 8 of 24 positions shown · non-contrast
Comparison: Previous exam(s).

ACR Breast Density Category a: The breast tissue is almost entirely
fatty.

CLINICAL DATA: Screening.

EXAM:
DIGITAL SCREENING BILATERAL MAMMOGRAM WITH TOMOSYNTHESIS AND CAD
TECHNIQUE: Bilateral screening digital craniocaudal and mediolateral oblique
mammograms were obtained. Bilateral screening digital breast
tomosynthesis was performed. The images were evaluated with
computer-aided detection.

[L MLO synth-2D]
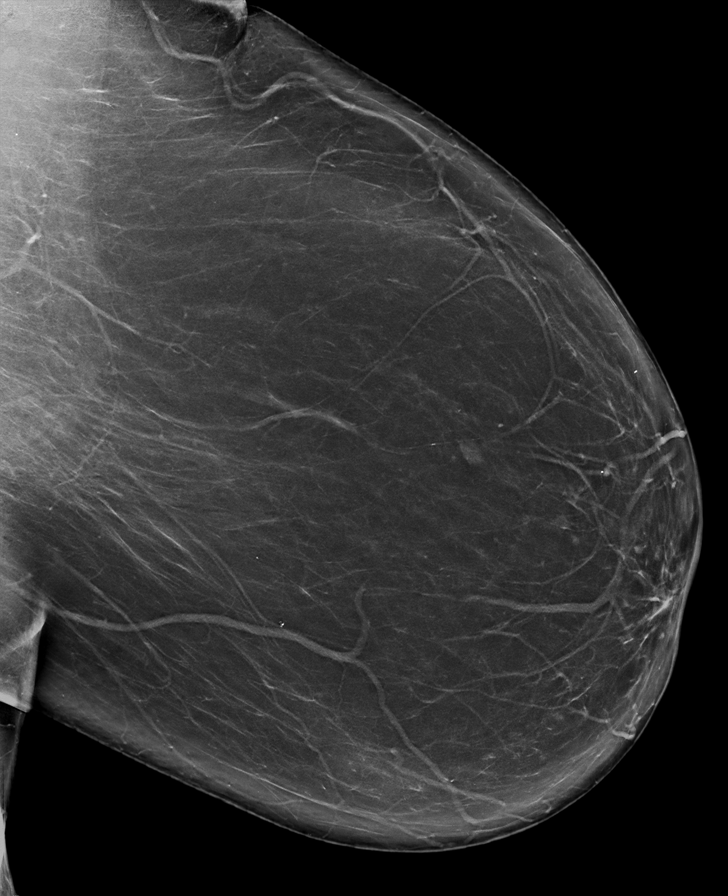

[R CC synth-2D]
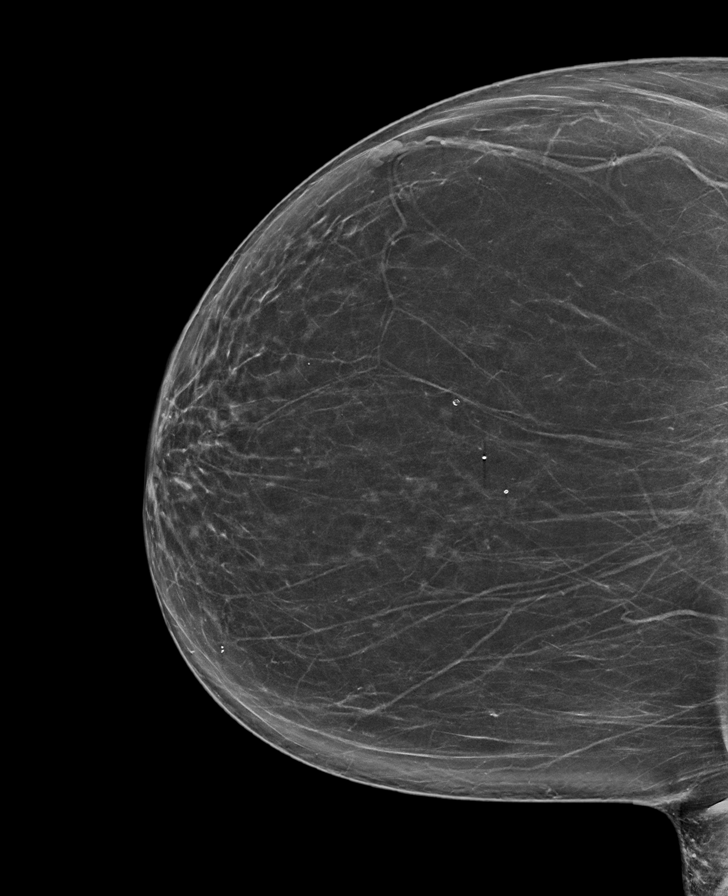

[L CC synth-2D]
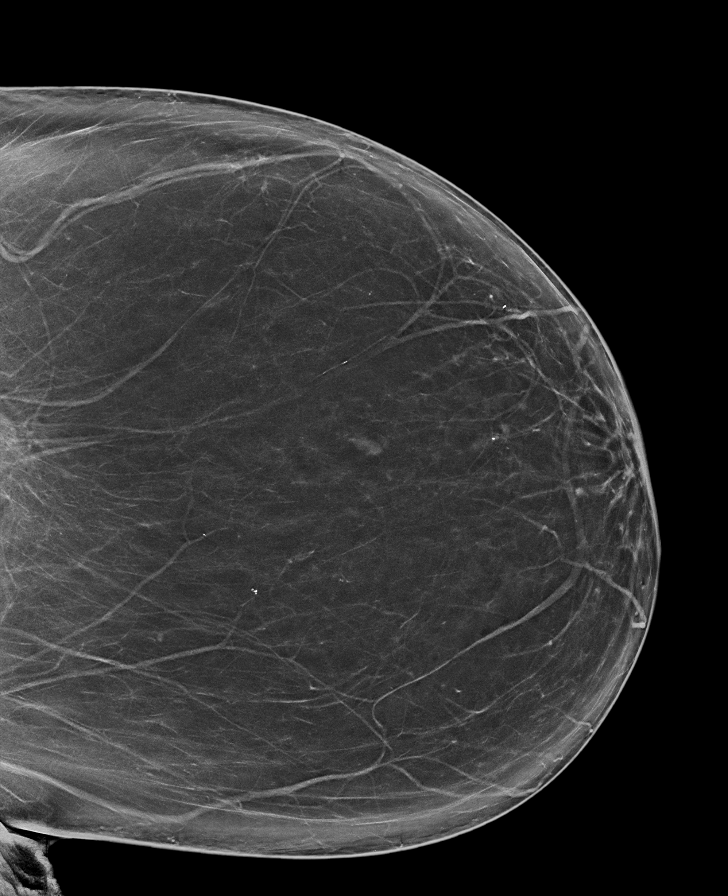

[R MLO synth-2D]
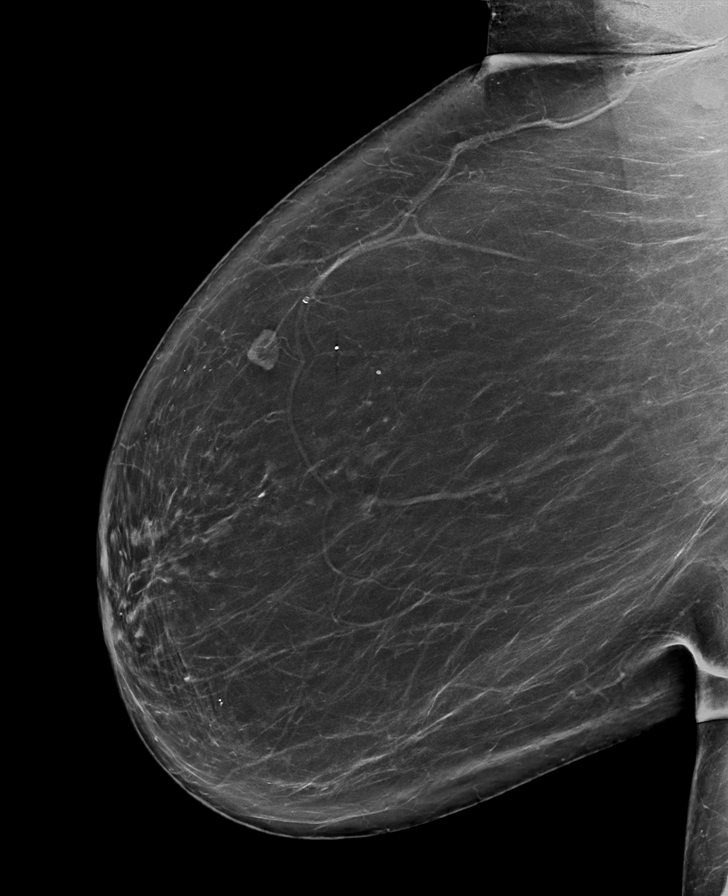

[L CC tomo · tomo slice 43/85.0]
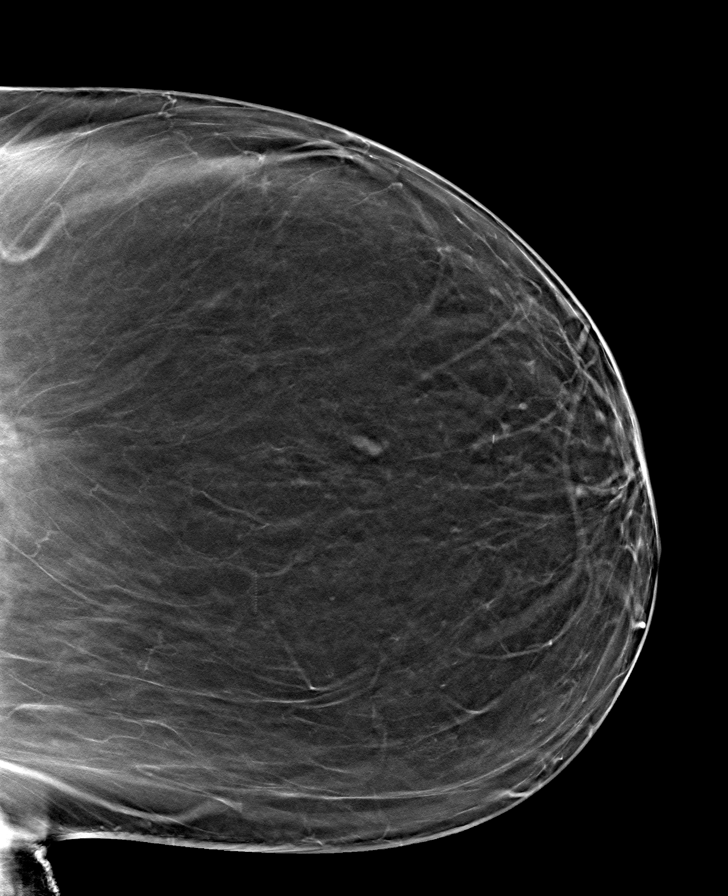

[R CC tomo · tomo slice 39/78.0]
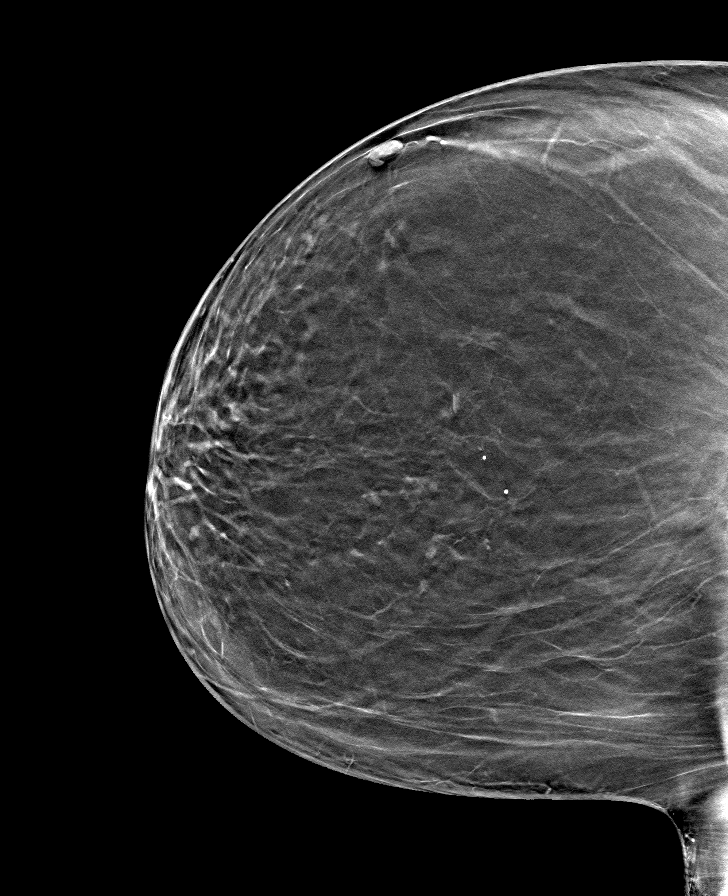

[L MLO tomo · tomo slice 47/92.0]
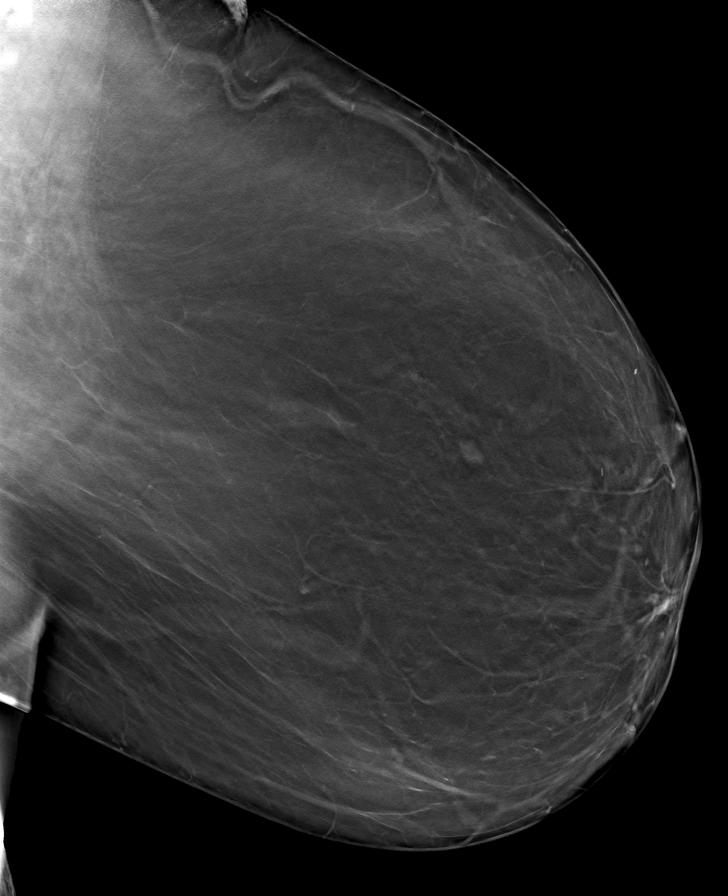

[R MLO tomo · tomo slice 45/88.0]
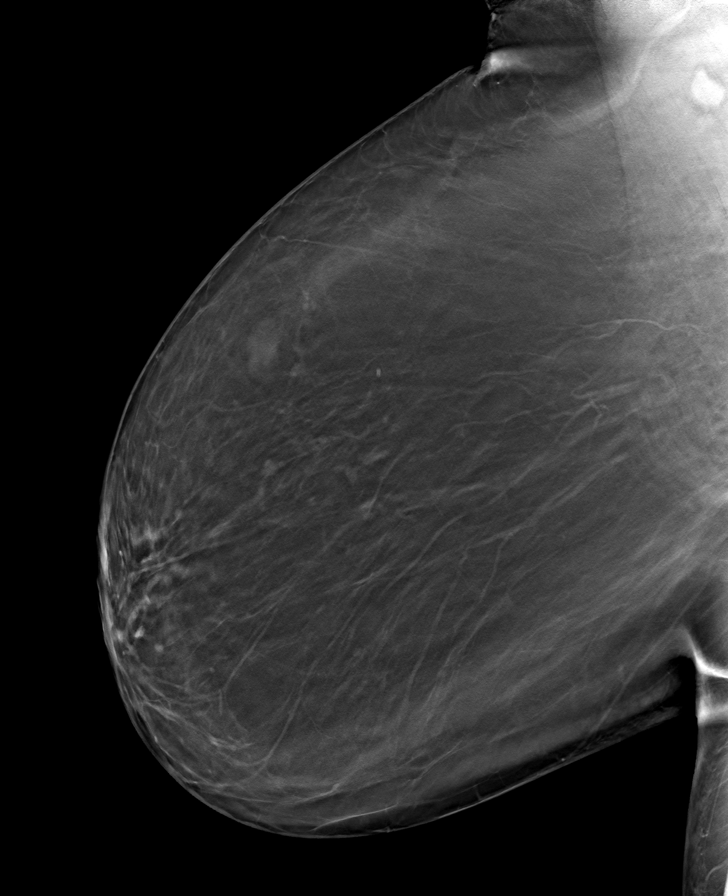

[8 of 24 positions shown; findings below may reference images not displayed]

FINDINGS: There are no findings suspicious for malignancy.
IMPRESSION: No mammographic evidence of malignancy. A result letter of this
screening mammogram will be mailed directly to the patient.

RECOMMENDATION:
Screening mammogram in one year. (Code:0E-3-N98)

BI-RADS CATEGORY  1: Negative.

## 2022-06-11 ENCOUNTER — Encounter: Payer: Self-pay | Admitting: Unknown Physician Specialty

## 2022-06-11 ENCOUNTER — Ambulatory Visit (INDEPENDENT_AMBULATORY_CARE_PROVIDER_SITE_OTHER): Payer: 59 | Admitting: Unknown Physician Specialty

## 2022-06-11 ENCOUNTER — Ambulatory Visit: Payer: Self-pay | Admitting: *Deleted

## 2022-06-11 VITALS — BP 149/83 | HR 88 | Temp 97.0°F | Ht 62.99 in | Wt 221.9 lb

## 2022-06-11 DIAGNOSIS — U071 COVID-19: Secondary | ICD-10-CM

## 2022-06-11 MED ORDER — MOLNUPIRAVIR EUA 200MG CAPSULE: 4.0000 | ORAL_CAPSULE | Freq: Two times a day (BID) | ORAL | 0 refills | Status: AC

## 2022-06-11 NOTE — Telephone Encounter (Signed)
  Chief Complaint: Covid Positive as of last night. Symptoms: Productive cough, head congestion of yellow mucus. Frequency: Symptoms started Friday with a sore throat. Pertinent Negatives: Patient denies Fever or shortness of breath or chest pain.   Disposition: [] ED /[] Urgent Care (no appt availability in office) / [x] Appointment(In office/virtual)/ []  [facility]/ [] Home Care/ [] Refused Recommended Disposition /[] [facility]/ []  Follow-up with PCP Additional Notes: Appt. Made with Kathrine Haddock, PA for today at 10:20.   Pt. Preferred to come in instead of doing a video visit.

## 2022-06-11 NOTE — Progress Notes (Signed)
BP (!) 149/83   Pulse 88   Temp (!) 97 F (36.1 C) (Oral)   Ht 5' 2.99" (1.6 m)   Wt 221 lb 14.4 oz (100.7 kg)   LMP  (LMP Unknown)   SpO2 99%   BMI 39.32 kg/m    Subjective:    Patient ID: Ashley Mcknight, female    DOB: Sep 23, 1950, 72 y.o.   MRN: [id number]  HPI: Ashley Mcknight is a 72 y.o. female  Chief Complaint  Patient presents with   Covid Positive    Tested positive at work, and wants antiviral   Pt states she had a cough and headache.  Symptoms started Friday with scratchy throat and Covid negative.  States it is the 4th time she has had it and wants an antiviral.  No fever, SOB, chest pain  Relevant past medical, surgical, family and social history reviewed and updated as indicated. Interim medical history since our last visit reviewed. Allergies and medications reviewed and updated.  Review of Systems  Per HPI unless specifically indicated above     Objective:    BP (!) 149/83   Pulse 88   Temp (!) 97 F (36.1 C) (Oral)   Ht 5' 2.99" (1.6 m)   Wt 221 lb 14.4 oz (100.7 kg)   LMP  (LMP Unknown)   SpO2 99%   BMI 39.32 kg/m   Wt Readings from Last 3 Encounters:  06/11/22 221 lb 14.4 oz (100.7 kg)  03/21/22 211 lb 6.4 oz (95.9 kg)  06/14/21 223 lb 6.4 oz (101.3 kg)    Physical Exam Constitutional:      General: She is not in acute distress.    Appearance: Normal appearance. She is well-developed.  HENT:     Head: Normocephalic and atraumatic.  Eyes:     General: Lids are normal. No scleral icterus.       Right eye: No discharge.        Left eye: No discharge.     Conjunctiva/sclera: Conjunctivae normal.  Neck:     Vascular: No carotid bruit or JVD.  Cardiovascular:     Rate and Rhythm: Normal rate and regular rhythm.     Heart sounds: Normal heart sounds.  Pulmonary:     Effort: Pulmonary effort is normal. No respiratory distress.     Breath sounds: Normal breath sounds.  Abdominal:     Palpations: There is no hepatomegaly or  splenomegaly.  Musculoskeletal:        General: Normal range of motion.     Cervical back: Normal range of motion and neck supple.  Skin:    General: Skin is warm and dry.     Coloration: Skin is not pale.     Findings: No rash.  Neurological:     Mental Status: She is alert and oriented to person, place, and time.  Psychiatric:        Behavior: Behavior normal.        Thought Content: Thought content normal.        Judgment: Judgment normal.     Results for orders placed or performed in visit on 03/21/22  Comp Met (CMET)  Result Value Ref Range   Glucose 125 (H) 70 - 99 mg/dL   BUN 13 8 - 27 mg/dL   Creatinine, Ser 0.94 0.57 - 1.00 mg/dL   eGFR 66 >59 mL/min/1.73   BUN/Creatinine Ratio 14 12 - 28   Sodium 140 134 - 144 mmol/L   Potassium  3.8 3.5 - 5.2 mmol/L   Chloride 100 96 - 106 mmol/L   CO2 25 20 - 29 mmol/L   Calcium 9.6 8.7 - 10.3 mg/dL   Total Protein 7.4 6.0 - 8.5 g/dL   Albumin 4.3 3.9 - 4.9 g/dL   Globulin, Total 3.1 1.5 - 4.5 g/dL   Albumin/Globulin Ratio 1.4 1.2 - 2.2   Bilirubin Total 0.4 0.0 - 1.2 mg/dL   Alkaline Phosphatase 86 44 - 121 IU/L   AST 19 0 - 40 IU/L   ALT 16 0 - 32 IU/L  Lipid Profile  Result Value Ref Range   Cholesterol, Total 143 100 - 199 mg/dL   Triglycerides 76 0 - 149 mg/dL   HDL 67 >39 mg/dL   VLDL Cholesterol Cal 15 5 - 40 mg/dL   LDL Chol Calc (NIH) 61 0 - 99 mg/dL   Chol/HDL Ratio 2.1 0.0 - 4.4 ratio  HgB A1c  Result Value Ref Range   Hgb A1c MFr Bld 6.2 (H) 4.8 - 5.6 %   Est. average glucose Bld gHb Est-mCnc 131 mg/dL      Assessment & Plan:   Problem List Items Addressed This Visit   None Visit Diagnoses     COVID-19    -  Primary   Discussed Paxlovid vs Molnupiravir.  She does not want the chance of rebound sxs so gave Molnupiravir.   Pt ed on care.   Relevant Medications   molnupiravir EUA (LAGEVRIO) 200 mg CAPS capsule        Follow up plan: Return if symptoms worsen or fail to improve.

## 2022-06-11 NOTE — Telephone Encounter (Signed)
Reason for Disposition  [1] HIGH RISK patient (e.g., weak immune system, age > 36 years, obesity with BMI 30 or higher, pregnant, chronic lung disease or other chronic medical condition) AND [2] COVID symptoms (e.g., cough, fever)  (Exceptions: Already seen by PCP and no new or worsening symptoms.)  Answer Assessment - Initial Assessment Questions 1. COVID-19 DIAGNOSIS: "How do you know that you have COVID?" (e.g., positive lab test or self-test, diagnosed by doctor or NP/PA, symptoms after exposure).     Positive for Covid this is the 3rd time last night. 2. COVID-19 EXPOSURE: "Was there any known exposure to COVID before the symptoms began?" CDC Definition of close contact: within 6 feet (2 meters) for a total of 15 minutes or more over a 24-hour period.      I work at the [employer] and I was exposed I guess there.   There is Covid there. I had it last year and my chest hurt so bad.   3. ONSET: "When did the COVID-19 symptoms start?"      My throat was sore last Friday and the Covid test was Friday was negative.   I went to work last night and I was tested positive.    The nurse at work has not called me yet.    4. WORST SYMPTOM: "What is your worst symptom?" (e.g., cough, fever, shortness of breath, muscle aches)     Not asked 5. COUGH: "Do you have a cough?" If Yes, ask: "How bad is the cough?"       Yes   Yellow mucus coming up. 6. FEVER: "Do you have a fever?" If Yes, ask: "What is your temperature, how was it measured, and when did it start?"     No  7. RESPIRATORY STATUS: "Describe your breathing?" (e.g., normal; shortness of breath, wheezing, unable to speak)      No short of breath or chest pain. 8. BETTER-SAME-WORSE: "Are you getting better, staying the same or getting worse compared to yesterday?"  If getting worse, ask, "In what way?"     Not asked 9. OTHER SYMPTOMS: "Do you have any other symptoms?"  (e.g., chills, fatigue, headache, loss of smell or taste, muscle  pain, sore throat)     My head is foggy, headaches, nose is runny.   Mostly head congestion. 10. HIGH RISK DISEASE: "Do you have any chronic medical problems?" (e.g., asthma, heart or lung disease, weak immune system, obesity, etc.)       No 11. VACCINE: "Have you had the COVID-19 vaccine?" If Yes, ask: "Which one, how many shots, when did you get it?"       Not asked 12. PREGNANCY: "Is there any chance you are pregnant?" "When was your last menstrual period?"       N/A due to age 72. O2 SATURATION MONITOR:  "Do you use an oxygen saturation monitor (pulse oximeter) at home?" If Yes, ask "What is your reading (oxygen level) today?" "What is your usual oxygen saturation reading?" (e.g., 95%)       N/A  Protocols used: Coronavirus (COVID-19) Diagnosed or Suspected-A-AH

## 2022-08-20 ENCOUNTER — Encounter: Payer: 59 | Admitting: Nurse Practitioner

## 2022-09-02 ENCOUNTER — Encounter: Payer: Self-pay | Admitting: Physician Assistant

## 2022-09-02 ENCOUNTER — Other Ambulatory Visit: Payer: Self-pay

## 2022-09-02 ENCOUNTER — Ambulatory Visit (INDEPENDENT_AMBULATORY_CARE_PROVIDER_SITE_OTHER): Payer: 59 | Admitting: Physician Assistant

## 2022-09-02 VITALS — BP 126/72 | HR 99 | Temp 98.1°F | Resp 16 | Ht 64.0 in | Wt 214.5 lb

## 2022-09-02 DIAGNOSIS — N183 Chronic kidney disease, stage 3 unspecified: Secondary | ICD-10-CM

## 2022-09-02 DIAGNOSIS — R7301 Impaired fasting glucose: Secondary | ICD-10-CM

## 2022-09-02 DIAGNOSIS — I10 Essential (primary) hypertension: Secondary | ICD-10-CM

## 2022-09-02 DIAGNOSIS — E785 Hyperlipidemia, unspecified: Secondary | ICD-10-CM

## 2022-09-02 DIAGNOSIS — Z0000 Encounter for general adult medical examination without abnormal findings: Secondary | ICD-10-CM

## 2022-09-02 DIAGNOSIS — Z6836 Body mass index (BMI) 36.0-36.9, adult: Secondary | ICD-10-CM

## 2022-09-02 MED ORDER — HYDROCHLOROTHIAZIDE 25 MG PO TABS: 25.0000 mg | ORAL_TABLET | Freq: Every day | ORAL | 1 refills | Status: DC

## 2022-09-02 MED ORDER — LISINOPRIL 40 MG PO TABS: 40.0000 mg | ORAL_TABLET | Freq: Every day | ORAL | 1 refills | Status: DC

## 2022-09-02 MED ORDER — ATORVASTATIN CALCIUM 10 MG PO TABS: 10.0000 mg | ORAL_TABLET | Freq: Every day | ORAL | 1 refills | Status: DC

## 2022-09-02 MED ORDER — NORTRIPTYLINE HCL 50 MG PO CAPS: 50.0000 mg | ORAL_CAPSULE | Freq: Every day | ORAL | 1 refills | Status: DC

## 2022-09-02 NOTE — Assessment & Plan Note (Signed)
Chronic, historic condition Patient is currently taking atorvastatin 10 mg p.o. daily and appears to be tolerating well Recheck cholesterol panel today-results to dictate further management Continue current regimen Follow-up in 6 months for monitoring or sooner if concerns arise

## 2022-09-02 NOTE — Assessment & Plan Note (Signed)
Chronic, historic condition Patient is currently taking lisinopril for blood pressure control which is also kidney protective Will recheck A1c today and if in diabetic range might need to add Farxiga for further benefit Recheck CMP for monitoring Follow-up in 6 months or sooner if concerns arise

## 2022-09-02 NOTE — Assessment & Plan Note (Signed)
Chronic, historic condition  patient's BMI is currently 36.82 She has lost about 7 lbs over the last 3 months per chart review She reports decreasing her soda consumption, reducing bread and red meat and eating more salads and lean protein She denies current exercise regimen but reports she is very active at her job Recommend incorporating at least 150 minutes of moderate intensity physical activity per week and continuing with dietary measures If these are not providing desired results may need to discuss medication. Follow up in 3 months

## 2022-09-02 NOTE — Assessment & Plan Note (Signed)
Chronic, historic condition Patient is currently taking hydrochlorothiazide 25 mg p.o. daily, lisinopril 40 mg p.o. daily and appears to be tolerating well Blood pressure is in goal today in office Continue current regimen Recommend incorporating at least 150 minutes of moderate intensity physical activity into her weekly regimen for further cardiovascular health benefits Follow-up in 6 months or sooner if concerns arise

## 2022-09-02 NOTE — Assessment & Plan Note (Signed)
Chronic, historic condition Most recent A1c was 6.1 with previous results at 6.4 Patient is not currently taking any medications Will recheck A1c today-results to dictate further management Recommend reduced sugar intake and carbohydrate conscious diet as well as exercise Follow-up as dictated by results

## 2022-09-02 NOTE — Progress Notes (Signed)
Annual Physical Exam   Name: Ashley Mcknight   MRN: [id number]    DOB: [birth year]   Date:09/02/2022  Today's Provider: Jacquelin Hawking, MHS, PA-C Introduced myself to the patient as a PA-C and provided education on APPs in clinical practice.         Subjective  Chief Complaint  Chief Complaint  Patient presents with   Annual Exam    HPI  Patient presents for annual CPE.  Diet: she has stopped drinking sodas. She has reduced her bread intake as well. She has tried to ear more salads and less red meat.  She reports interest in weight loss medications  Exercise: not engaged in regular exercise at this time. She reports she works third shift and walks a lot during this time .  Sleep: She gets about 5 hours of rest. She feels well rested when she wakes up  Mood: Good   Flowsheet Row Office Visit [facility]/02/2022 [facility]  AUDIT-C Score 1      Depression: Phq 9 is  negative    09/02/2022    8:32 AM 03/21/2022    8:13 AM 06/14/2021   11:05 AM 05/17/2021    1:53 PM 02/16/2021    1:32 PM  Depression screen PHQ 2/9  Decreased Interest 0 0 0 0 0  Down, Depressed, Hopeless 0 0 0 0 0  PHQ - 2 Score 0 0 0 0 0  Altered sleeping  0 0 0 0  Tired, decreased energy  0 0 0 0  Change in appetite  0 0 0 0  Feeling bad or failure about yourself   0 0 0 0  Trouble concentrating  0 0 0 0  Moving slowly or fidgety/restless  0 0 0 0  Suicidal thoughts  0 0 0 0  PHQ-9 Score  0 0 0 0  Difficult doing work/chores  Not difficult at all Not difficult at all Not difficult at all    Hypertension: BP Readings from Last 3 Encounters:  09/02/22 126/72  06/11/22 (!) 149/83  03/21/22 121/73   Obesity: Wt Readings from Last 3 Encounters:  09/02/22 214 lb 8 oz (97.3 kg)  06/11/22 221 lb 14.4 oz (100.7 kg)  03/21/22 211 lb 6.4 oz (95.9 kg)   BMI Readings from Last 3 Encounters:  09/02/22 36.82 kg/m  06/11/22 39.32 kg/m  03/21/22 37.45 kg/m     Vaccines:    HPV: Aged out  Tdap: UTD Shingrix: declined  Pneumonia: declined  Flu: Due in the fall  COVID-19: Discussed vaccine and booster recommendations per available CDC guidelines     Hep C Screening: completed  HIV screening: completed  STD testing and prevention (HIV/chl/gon/syphilis):  Intimate partner violence: negative Sexual History : Menstrual History/LMP/Abnormal Bleeding: she has had a hysterectomy over 30 years ago  Discussed importance of follow up if any post-menopausal bleeding: not applicable Incontinence Symptoms: No.  Breast cancer hx:  - Last Mammogram: UTD, next due in 2025 - BRCA gene screening:   Osteoporosis Prevention : Discussed high calcium and vitamin D supplementation, weight bearing exercises Bone density :yes- completed last year, next due in 2025  Cervical cancer screening:   Skin cancer: Discussed monitoring for atypical lesions  Colorectal cancer screening: Yes- cologuard completed last year- next due in 2026   Lung cancer:  Low Dose CT Chest recommended if Age 49-80 years, 20 pack-year currently smoking OR have quit w/in 15years. Patient does not  qualify.   ECG: NA  Advanced Care Planning: A voluntary discussion about advance care planning including the explanation and discussion of advance directives.  Discussed health care proxy and Living will, and the patient was able to identify a health care proxy as no one.  Patient does not have a living will in effect.  Lipids: Lab Results  Component Value Date   CHOL 143 03/21/2022   CHOL 143 05/17/2021   CHOL 146 02/16/2021   Lab Results  Component Value Date   HDL 67 03/21/2022   HDL 70 05/17/2021   HDL 66 02/16/2021   Lab Results  Component Value Date   LDLCALC 61 03/21/2022   LDLCALC 56 05/17/2021   LDLCALC 59 02/16/2021   Lab Results  Component Value Date   TRIG 76 03/21/2022   TRIG 94 05/17/2021   TRIG 119 02/16/2021   Lab Results  Component Value Date   CHOLHDL 2.1 03/21/2022    CHOLHDL 2.0 05/17/2021   CHOLHDL 2.2 02/16/2021   No results found for: "LDLDIRECT"  Glucose: Glucose  Date Value Ref Range Status  03/21/2022 125 (H) 70 - 99 mg/dL Final  16/12/9602 87 70 - 99 mg/dL Final  54/11/8117 90 70 - 99 mg/dL Final  14/78/2956 95 65 - 99 mg/dL Final  21/30/8657 97 65 - 99 mg/dL Final   Glucose, Bld  Date Value Ref Range Status  10/27/2016 96 65 - 99 mg/dL Final  84/69/6295 284 (H) 65 - 99 mg/dL Final    Patient Active Problem List   Diagnosis Date Noted   Rash 06/14/2021   CKD (chronic kidney disease) stage 3, GFR 30-59 ml/min (HCC) 02/26/2020   Insomnia 12/16/2019   Depression, major, single episode, severe (HCC) 12/16/2019   Right leg pain 06/08/2019   Hyperlipemia 09/30/2017   Hypertension 09/15/2014   Sleep related leg cramps 09/15/2014   Impaired fasting glucose 09/15/2014   Obesity 09/15/2014    Past Surgical History:  Procedure Laterality Date   APPENDECTOMY  2011   CARPAL TUNNEL RELEASE Bilateral    CHOLECYSTECTOMY     KNEE SURGERY Left    ROTATOR CUFF REPAIR     toenail removal     TOTAL ABDOMINAL HYSTERECTOMY W/ BILATERAL SALPINGOOPHORECTOMY      Family History  Problem Relation Age of Onset   Hypertension Mother    Cancer Father        throat   Cancer Brother     Social History   Socioeconomic History   Marital status: Widowed    Spouse name: Not on file   Number of children: Not on file   Years of education: Not on file   Highest education level: Not on file  Occupational History   Not on file  Tobacco Use   Smoking status: Never   Smokeless tobacco: Never  Vaping Use   Vaping Use: Never used  Substance and Sexual Activity   Alcohol use: No    Alcohol/week: 0.0 standard drinks of alcohol   Drug use: No   Sexual activity: Yes  Other Topics Concern   Not on file  Social History Narrative   Not on file   Social Determinants of Health   Financial Resource Strain: Low Risk  (09/02/2022)   Overall  Financial Resource Strain (CARDIA)    Difficulty of Paying Living Expenses: Not hard at all  Food Insecurity: No Food Insecurity (09/02/2022)   Hunger Vital Sign    Worried About Running Out of Food in the Last  Year: Never true    Ran Out of Food in the Last Year: Never true  Transportation Needs: No Transportation Needs (09/02/2022)   PRAPARE - Administrator, Civil Service (Medical): No    Lack of Transportation (Non-Medical): No  Physical Activity: Inactive (09/02/2022)   Exercise Vital Sign    Days of Exercise per Week: 0 days    Minutes of Exercise per Session: 0 min  Stress: No Stress Concern Present (09/02/2022)   [employer] of Occupational Health - Occupational Stress Questionnaire    Feeling of Stress : Only a little  Social Connections: Moderately Integrated (09/02/2022)   Social Connection and Isolation Panel [NHANES]    Frequency of Communication with Friends and Family: More than three times a week    Frequency of Social Gatherings with Friends and Family: More than three times a week    Attends Religious Services: More than 4 times per year    Active Member of [employer] or Organizations: Yes    Attends Banker Meetings: More than 4 times per year    Marital Status: Widowed  Intimate Partner Violence: Not At Risk (09/02/2022)   Humiliation, Afraid, Rape, and Kick questionnaire    Fear of Current or Ex-Partner: No    Emotionally Abused: No    Physically Abused: No    Sexually Abused: No     Current Outpatient Medications:    aspirin 81 MG tablet, Take 81 mg by mouth daily., Disp: , Rfl:    clotrimazole-betamethasone (LOTRISONE) cream, Apply 1 Application topically daily., Disp: 30 g, Rfl: 0   atorvastatin (LIPITOR) 10 MG tablet, Take 1 tablet (10 mg total) by mouth daily., Disp: 90 tablet, Rfl: 1   hydrochlorothiazide (HYDRODIURIL) 25 MG tablet, Take 1 tablet (25 mg total) by mouth daily., Disp: 90 tablet, Rfl: 1   lisinopril (ZESTRIL) 40 MG  tablet, Take 1 tablet (40 mg total) by mouth daily., Disp: 90 tablet, Rfl: 1   nortriptyline (PAMELOR) 50 MG capsule, Take 1 capsule (50 mg total) by mouth at bedtime., Disp: 90 capsule, Rfl: 1  No Known Allergies   Review of Systems  Constitutional:  Negative for chills, fever, malaise/fatigue and weight loss.  HENT:  Negative for hearing loss, nosebleeds, sore throat and tinnitus.   Eyes:  Negative for blurred vision, double vision and photophobia.  Respiratory:  Negative for cough, shortness of breath and wheezing.   Cardiovascular:  Negative for chest pain, palpitations and leg swelling.  Gastrointestinal:  Negative for blood in stool, constipation, diarrhea, heartburn, nausea and vomiting.  Genitourinary:  Negative for dysuria, frequency and hematuria.  Musculoskeletal:  Negative for falls, joint pain and myalgias.  Skin:  Negative for itching and rash.  Neurological:  Negative for dizziness, tingling, tremors, loss of consciousness, weakness and headaches.  Psychiatric/Behavioral:  Negative for depression, memory loss, substance abuse and suicidal ideas. The patient does not have insomnia.       Objective  Vitals:   09/02/22 0833  BP: 126/72  Pulse: 99  Resp: 16  Temp: 98.1 F (36.7 C)  TempSrc: Oral  SpO2: 98%  Weight: 214 lb 8 oz (97.3 kg)  Height: 5\' 4"  (1.626 m)    Body mass index is 36.82 kg/m.  Physical Exam Vitals reviewed.  Constitutional:      General: She is awake.     Appearance: Normal appearance. She is well-developed and well-groomed.  HENT:     Head: Normocephalic and atraumatic.     Right  Ear: Hearing, tympanic membrane and ear canal normal.     Left Ear: Hearing, tympanic membrane and ear canal normal.     Nose: Nose normal.     Mouth/Throat:     Lips: Pink.     Mouth: Mucous membranes are moist. No oral lesions.     Pharynx: Oropharynx is clear. Uvula midline. No oropharyngeal exudate or posterior oropharyngeal erythema.     Tonsils: No  tonsillar exudate or tonsillar abscesses.  Eyes:     General: Lids are normal. Gaze aligned appropriately.     Extraocular Movements:     Right eye: Normal extraocular motion and no nystagmus.     Left eye: Normal extraocular motion and no nystagmus.     Conjunctiva/sclera: Conjunctivae normal.     Pupils: Pupils are equal, round, and reactive to light.  Neck:     Thyroid: No thyroid mass, thyromegaly or thyroid tenderness.  Cardiovascular:     Rate and Rhythm: Normal rate and regular rhythm.     Pulses: Normal pulses.          Radial pulses are 2+ on the right side and 2+ on the left side.     Heart sounds: Normal heart sounds. No murmur heard.    No friction rub. No gallop.  Pulmonary:     Effort: Pulmonary effort is normal.     Breath sounds: Normal breath sounds. No wheezing, rhonchi or rales.  Abdominal:     General: Abdomen is flat. Bowel sounds are normal.     Palpations: Abdomen is soft.  Musculoskeletal:     Cervical back: Normal range of motion and neck supple.     Right lower leg: No edema.     Left lower leg: No edema.  Lymphadenopathy:     Head:     Right side of head: No submental, submandibular or preauricular adenopathy.     Left side of head: No submental, submandibular or preauricular adenopathy.     Cervical:     Right cervical: No superficial or posterior cervical adenopathy.    Left cervical: No superficial or posterior cervical adenopathy.     Upper Body:     Right upper body: No supraclavicular adenopathy.     Left upper body: No supraclavicular adenopathy.  Skin:    General: Skin is warm and dry.  Neurological:     General: No focal deficit present.     Mental Status: She is alert and oriented to person, place, and time. Mental status is at baseline.     Cranial Nerves: Cranial nerves 2-12 are intact.     Motor: Motor function is intact.     Deep Tendon Reflexes:     Reflex Scores:      Patellar reflexes are 2+ on the right side and 2+ on the left  side. Psychiatric:        Mood and Affect: Mood normal.        Behavior: Behavior normal. Behavior is cooperative.        Thought Content: Thought content normal.        Judgment: Judgment normal.      No results found for this or any previous visit (from the past 2160 hour(s)).   Fall Risk:    09/02/2022    8:32 AM 03/21/2022    8:13 AM 06/14/2021   11:05 AM 05/17/2021    1:53 PM 12/16/2019    8:33 AM  Fall Risk   Falls in the past year? 0  0 0 0 0  Number falls in past yr: 0 0 0 0 0  Injury with Fall? 0 0 0 0 0  Risk for fall due to :  No Fall Risks No Fall Risks No Fall Risks No Fall Risks  Follow up  Falls evaluation completed Falls evaluation completed Falls evaluation completed Falls evaluation completed     Functional Status Survey: Is the patient deaf or have difficulty hearing?: No Does the patient have difficulty seeing, even when wearing glasses/contacts?: No Does the patient have difficulty concentrating, remembering, or making decisions?: No Does the patient have difficulty walking or climbing stairs?: No Does the patient have difficulty dressing or bathing?: No Does the patient have difficulty doing errands alone such as visiting a doctor's office or shopping?: No   Assessment & Plan  Problem List Items Addressed This Visit       Cardiovascular and Mediastinum   Hypertension    Chronic, historic condition Patient is currently taking hydrochlorothiazide 25 mg p.o. daily, lisinopril 40 mg p.o. daily and appears to be tolerating well Blood pressure is in goal today in office Continue current regimen Recommend incorporating at least 150 minutes of moderate intensity physical activity into her weekly regimen for further cardiovascular health benefits Follow-up in 6 months or sooner if concerns arise      Relevant Medications   atorvastatin (LIPITOR) 10 MG tablet   hydrochlorothiazide (HYDRODIURIL) 25 MG tablet   lisinopril (ZESTRIL) 40 MG tablet      Endocrine   Impaired fasting glucose    Chronic, historic condition Most recent A1c was 6.1 with previous results at 6.4 Patient is not currently taking any medications Will recheck A1c today-results to dictate further management Recommend reduced sugar intake and carbohydrate conscious diet as well as exercise Follow-up as dictated by results      Relevant Orders   HgB A1c   Urinalysis, Routine w reflex microscopic     Genitourinary   CKD (chronic kidney disease) stage 3, GFR 30-59 ml/min (HCC)    Chronic, historic condition Patient is currently taking lisinopril for blood pressure control which is also kidney protective Will recheck A1c today and if in diabetic range might need to add Farxiga for further benefit Recheck CMP for monitoring Follow-up in 6 months or sooner if concerns arise      Relevant Orders   COMPLETE METABOLIC PANEL WITH GFR   CBC w/Diff/Platelet     Other   Obesity    Chronic, historic condition  patient's BMI is currently 36.82 She has lost about 7 lbs over the last 3 months per chart review She reports decreasing her soda consumption, reducing bread and red meat and eating more salads and lean protein She denies current exercise regimen but reports she is very active at her job Recommend incorporating at least 150 minutes of moderate intensity physical activity per week and continuing with dietary measures If these are not providing desired results may need to discuss medication. Follow up in 3 months       Hyperlipemia    Chronic, historic condition Patient is currently taking atorvastatin 10 mg p.o. daily and appears to be tolerating well Recheck cholesterol panel today-results to dictate further management Continue current regimen Follow-up in 6 months for monitoring or sooner if concerns arise      Relevant Medications   atorvastatin (LIPITOR) 10 MG tablet   hydrochlorothiazide (HYDRODIURIL) 25 MG tablet   lisinopril (ZESTRIL) 40 MG tablet    Other Relevant Orders  Lipid Profile   Other Visit Diagnoses     Annual physical exam    -  Primary      -USPSTF grade A and B recommendations reviewed with patient; age-appropriate recommendations, preventive care, screening tests, etc discussed and encouraged; healthy living encouraged; see AVS for patient education given to patient -Discussed importance of 150 minutes of physical activity weekly, eat two servings of fish weekly, eat one serving of tree nuts ( cashews, pistachios, pecans, almonds.Marland Kitchen) every other day, eat 6 servings of fruit/vegetables daily and drink plenty of water and avoid sweet beverages.   -Reviewed Health Maintenance: Yes.     Return in about 3 months (around 12/03/2022) for weight loss concerns .   I, Kristina Bertone E Samit Sylve, PA-C, have reviewed all documentation for this visit. The documentation on 09/02/22 for the exam, diagnosis, procedures, and orders are all accurate and complete.   Jacquelin Hawking, MHS, PA-C [facility]

## 2022-09-03 LAB — URINALYSIS, ROUTINE W REFLEX MICROSCOPIC
Bilirubin Urine: NEGATIVE
Glucose, UA: NEGATIVE
Hgb urine dipstick: NEGATIVE
Ketones, ur: NEGATIVE
Leukocytes,Ua: NEGATIVE
Nitrite: NEGATIVE
Protein, ur: NEGATIVE
Specific Gravity, Urine: 1.019 (ref 1.001–1.035)
pH: 6.5 (ref 5.0–8.0)

## 2022-09-03 LAB — CBC WITH DIFFERENTIAL/PLATELET
Absolute Monocytes: 623 cells/uL (ref 200–950)
Basophils Absolute: 49 cells/uL (ref 0–200)
Basophils Relative: 0.7 %
Eosinophils Absolute: 301 cells/uL (ref 15–500)
Eosinophils Relative: 4.3 %
HCT: 40.2 % (ref 35.0–45.0)
Hemoglobin: 12.7 g/dL (ref 11.7–15.5)
Lymphs Abs: 2758 cells/uL (ref 850–3900)
MCH: 26.1 pg — ABNORMAL LOW (ref 27.0–33.0)
MCHC: 31.6 g/dL — ABNORMAL LOW (ref 32.0–36.0)
MCV: 82.5 fL (ref 80.0–100.0)
MPV: 11.6 fL (ref 7.5–12.5)
Monocytes Relative: 8.9 %
Neutro Abs: 3269 cells/uL (ref 1500–7800)
Neutrophils Relative %: 46.7 %
Platelets: 307 10*3/uL (ref 140–400)
RBC: 4.87 10*6/uL (ref 3.80–5.10)
RDW: 13.3 % (ref 11.0–15.0)
Total Lymphocyte: 39.4 %
WBC: 7 10*3/uL (ref 3.8–10.8)

## 2022-09-03 LAB — COMPLETE METABOLIC PANEL WITH GFR
AG Ratio: 1.3 (calc) (ref 1.0–2.5)
ALT: 12 U/L (ref 6–29)
AST: 14 U/L (ref 10–35)
Albumin: 4.2 g/dL (ref 3.6–5.1)
Alkaline phosphatase (APISO): 87 U/L (ref 37–153)
BUN: 12 mg/dL (ref 7–25)
CO2: 27 mmol/L (ref 20–32)
Calcium: 9.6 mg/dL (ref 8.6–10.4)
Chloride: 104 mmol/L (ref 98–110)
Creat: 0.94 mg/dL (ref 0.50–1.05)
Globulin: 3.2 g/dL (calc) (ref 1.9–3.7)
Glucose, Bld: 95 mg/dL (ref 65–99)
Potassium: 4.6 mmol/L (ref 3.5–5.3)
Sodium: 140 mmol/L (ref 135–146)
Total Bilirubin: 0.3 mg/dL (ref 0.2–1.2)
Total Protein: 7.4 g/dL (ref 6.1–8.1)
eGFR: 66 mL/min/{1.73_m2} (ref >=60)

## 2022-09-03 LAB — LIPID PANEL
Cholesterol: 150 mg/dL (ref <200)
HDL: 69 mg/dL (ref >=50)
LDL Cholesterol (Calc): 63 mg/dL (calc)
Non-HDL Cholesterol (Calc): 81 mg/dL (calc) (ref <130)
Total CHOL/HDL Ratio: 2.2 (calc) (ref <5.0)
Triglycerides: 96 mg/dL (ref <150)

## 2022-09-03 LAB — HEMOGLOBIN A1C
Hgb A1c MFr Bld: 6.3 % of total Hgb — ABNORMAL HIGH (ref <5.7)
Mean Plasma Glucose: 134 mg/dL
eAG (mmol/L): 7.4 mmol/L

## 2022-09-03 NOTE — Progress Notes (Signed)
Your lab results have returned Your A1c was 6.3.  This is still within the range of prediabetes at this time medication is not indicated but I recommend that you watch your intake of excess sugar and carbohydrates and make sure that you are exercising regularly.  These measures can help prevent progression to type 2 diabetes and need for medication management. Your blood count was overall normal and stable at this time.  It does not show signs of anemia or elevated white count. Your cholesterol looks great.  Continue your current regimen. Your electrolytes, liver and kidney function are all within normal limits. Your urinalysis results were normal

## 2022-12-04 ENCOUNTER — Other Ambulatory Visit: Payer: Self-pay | Admitting: Nurse Practitioner

## 2022-12-05 NOTE — Telephone Encounter (Signed)
Requested medication (s) are due for refill today: Yes  Requested medication (s) are on the active medication list: Yes  Last refill:  04/15/22  Future visit scheduled: Yes  Notes to clinic:  Manual review.    Requested Prescriptions  Pending Prescriptions Disp Refills   clotrimazole-betamethasone (LOTRISONE) cream [Pharmacy Med Name: CLOTRIMAZOLE-BETAMETHASONE CRM] 30 g 0    Sig: Apply 1 Application topically daily.     Off-Protocol Failed - 12/04/2022  9:42 AM      Failed - Medication not assigned to a protocol, review manually.      Passed - Valid encounter within last 12 months    Recent Outpatient Visits           3 months ago Annual physical exam   [facility] Mecum, Oswaldo Conroy, PA-C   5 months ago COVID-19   [facility] Gabriel Cirri, NP   8 months ago Primary hypertension   [facility] Larae Grooms, NP   1 year ago Rash   [facility] Vigg, Avanti, MD   1 year ago Annual physical exam   [facility] Larae Grooms, NP       Future Appointments             In 1 week Mecum, Oswaldo Conroy, PA-C [facility]

## 2022-12-12 ENCOUNTER — Encounter: Payer: Self-pay | Admitting: Physician Assistant

## 2022-12-12 ENCOUNTER — Ambulatory Visit: Payer: 59 | Admitting: Physician Assistant

## 2022-12-12 VITALS — BP 166/91 | HR 84 | Temp 98.3°F | Ht 63.0 in | Wt 216.4 lb

## 2022-12-12 DIAGNOSIS — Z1231 Encounter for screening mammogram for malignant neoplasm of breast: Secondary | ICD-10-CM

## 2022-12-12 DIAGNOSIS — Z23 Encounter for immunization: Secondary | ICD-10-CM

## 2022-12-12 DIAGNOSIS — Z0000 Encounter for general adult medical examination without abnormal findings: Secondary | ICD-10-CM | POA: Diagnosis not present

## 2022-12-12 DIAGNOSIS — R7303 Prediabetes: Secondary | ICD-10-CM

## 2022-12-12 NOTE — Progress Notes (Signed)
Annual Physical Exam   Name: Ashley Mcknight   MRN: [id number]    DOB: 07-26-1950   Date:12/12/2022  Today's Provider: Jacquelin Hawking, MHS, PA-C Introduced myself to the patient as a PA-C and provided education on APPs in clinical practice.         Subjective  Chief Complaint  Chief Complaint  Patient presents with   Annual Exam    HPI  Patient presents for annual CPE.  Diet: She reports emotional eating and does not feel like her diet is well balanced  Exercise: She has been doing abdominal exercises, and using other strength training, every morning   Sleep: "I sleep good" she works nights. Getting about 3-4 hours at a time (about 6 hours total)  Mood: She reports some nervousness     Flowsheet Row Office Visit from 09/02/2022 in [facility]  AUDIT-C Score 1      Depression: Phq 9 is  negative    12/12/2022    2:10 PM 09/02/2022    8:32 AM 03/21/2022    8:13 AM 06/14/2021   11:05 AM 05/17/2021    1:53 PM  Depression screen PHQ 2/9  Decreased Interest 0 0 0 0 0  Down, Depressed, Hopeless 0 0 0 0 0  PHQ - 2 Score 0 0 0 0 0  Altered sleeping 0  0 0 0  Tired, decreased energy 0  0 0 0  Change in appetite 0  0 0 0  Feeling bad or failure about yourself  0  0 0 0  Trouble concentrating 0  0 0 0  Moving slowly or fidgety/restless 0  0 0 0  Suicidal thoughts 0  0 0 0  PHQ-9 Score 0  0 0 0  Difficult doing work/chores Not difficult at all  Not difficult at all Not difficult at all Not difficult at all   Hypertension: BP Readings from Last 3 Encounters:  12/12/22 (!) 166/91  09/02/22 126/72  06/11/22 (!) 149/83   Obesity: Wt Readings from Last 3 Encounters:  12/12/22 216 lb 6.4 oz (98.2 kg)  09/02/22 214 lb 8 oz (97.3 kg)  06/11/22 221 lb 14.4 oz (100.7 kg)   BMI Readings from Last 3 Encounters:  12/12/22 38.33 kg/m  09/02/22 36.82 kg/m  06/11/22 39.32 kg/m     Vaccines:   HPV: aged out  Tdap: UTD Shingrix: Declines today  Discussed importance of vaccine for disease prevention, patient questions answered and education materials  offered.  Pneumonia: declines today Discussed importance of vaccine for disease prevention, patient questions answered and education materials  offered.  Flu: She is amenable to getting today  COVID-19: Discussed vaccine and booster recommendations per available CDC guidelines     Hep C Screening: completed  HIV screening: discontinued  STD testing and prevention (HIV/chl/gon/syphilis):  Intimate partner violence: negative Sexual History : she is not sexually active.  Menstrual History/LMP/Abnormal Bleeding: She has had a hysterectomy  Discussed importance of follow up if any post-menopausal bleeding: yes Incontinence Symptoms: No.  Breast cancer hx:  - Last Mammogram: 2023 - order placed for routine screening - BRCA gene screening:   Osteoporosis Prevention : Discussed high calcium and vitamin D supplementation, weight bearing exercises Bone density :yes, completed last year - normal results   Cervical cancer screening: discontinued   Skin cancer: Discussed monitoring for atypical lesions  Colorectal cancer screening: Cologuard completed last year- negative, repeat in 2026   Lung cancer:  Low Dose CT Chest recommended if Age 22-80 years, 20 pack-year currently smoking OR have quit w/in 15years. Patient does not qualify.   ECG: NA  Advanced Care Planning: A voluntary discussion about advance care planning including the explanation and discussion of advance directives.  Discussed health care proxy and Living will, and the patient was able to identify a health care proxy as in process.  Patient  is getting  a living will in effect since her husband passed.  Lipids: Lab Results  Component Value Date   CHOL 150 09/02/2022   CHOL 143 03/21/2022   CHOL 143 05/17/2021   Lab Results  Component Value Date   HDL 69 09/02/2022   HDL 67 03/21/2022   HDL 70 05/17/2021   Lab  Results  Component Value Date   LDLCALC 63 09/02/2022   LDLCALC 61 03/21/2022   LDLCALC 56 05/17/2021   Lab Results  Component Value Date   TRIG 96 09/02/2022   TRIG 76 03/21/2022   TRIG 94 05/17/2021   Lab Results  Component Value Date   CHOLHDL 2.2 09/02/2022   CHOLHDL 2.1 03/21/2022   CHOLHDL 2.0 05/17/2021   No results found for: "LDLDIRECT"  Glucose: Glucose  Date Value Ref Range Status  03/21/2022 125 (H) 70 - 99 mg/dL Final  82/95/6213 87 70 - 99 mg/dL Final  08/65/7846 90 70 - 99 mg/dL Final  96/29/5284 95 65 - 99 mg/dL Final  13/24/4010 97 65 - 99 mg/dL Final   Glucose, Bld  Date Value Ref Range Status  09/02/2022 95 65 - 99 mg/dL Final    Comment:    .            Fasting reference interval .   10/27/2016 96 65 - 99 mg/dL Final  27/25/3664 403 (H) 65 - 99 mg/dL Final    Patient Active Problem List   Diagnosis Date Noted   Prediabetes 12/12/2022   Rash 06/14/2021   CKD (chronic kidney disease) stage 3, GFR 30-59 ml/min (HCC) 02/26/2020   Insomnia 12/16/2019   Depression, major, single episode, severe (HCC) 12/16/2019   Right leg pain 06/08/2019   Hyperlipemia 09/30/2017   Hypertension 09/15/2014   Sleep related leg cramps 09/15/2014   Impaired fasting glucose 09/15/2014   Obesity 09/15/2014    Past Surgical History:  Procedure Laterality Date   APPENDECTOMY  2011   CARPAL TUNNEL RELEASE Bilateral    CHOLECYSTECTOMY     KNEE SURGERY Left    ROTATOR CUFF REPAIR     toenail removal     TOTAL ABDOMINAL HYSTERECTOMY W/ BILATERAL SALPINGOOPHORECTOMY      Family History  Problem Relation Age of Onset   Hypertension Mother    Cancer Father        throat   Cancer Brother     Social History   Socioeconomic History   Marital status: Widowed    Spouse name: Not on file   Number of children: Not on file   Years of education: Not on file   Highest education level: Not on file  Occupational History   Not on file  Tobacco Use   Smoking  status: Never   Smokeless tobacco: Never  Vaping Use   Vaping status: Never Used  Substance and Sexual Activity   Alcohol use: No    Alcohol/week: 0.0 standard drinks of alcohol   Drug use: No   Sexual activity: Yes  Other Topics Concern   Not on file  Social History Narrative  Not on file   Social Determinants of Health   Financial Resource Strain: Low Risk  (09/02/2022)   Overall Financial Resource Strain (CARDIA)    Difficulty of Paying Living Expenses: Not hard at all  Food Insecurity: No Food Insecurity (09/02/2022)   Hunger Vital Sign    Worried About Running Out of Food in the Last Year: Never true    Ran Out of Food in the Last Year: Never true  Transportation Needs: No Transportation Needs (09/02/2022)   PRAPARE - Administrator, Civil Service (Medical): No    Lack of Transportation (Non-Medical): No  Physical Activity: Inactive (09/02/2022)   Exercise Vital Sign    Days of Exercise per Week: 0 days    Minutes of Exercise per Session: 0 min  Stress: No Stress Concern Present (09/02/2022)   [employer] of Occupational Health - Occupational Stress Questionnaire    Feeling of Stress : Only a little  Social Connections: Moderately Integrated (09/02/2022)   Social Connection and Isolation Panel [NHANES]    Frequency of Communication with Friends and Family: More than three times a week    Frequency of Social Gatherings with Friends and Family: More than three times a week    Attends Religious Services: More than 4 times per year    Active Member of [employer] or Organizations: Yes    Attends Banker Meetings: More than 4 times per year    Marital Status: Widowed  Intimate Partner Violence: Not At Risk (09/02/2022)   Humiliation, Afraid, Rape, and Kick questionnaire    Fear of Current or Ex-Partner: No    Emotionally Abused: No    Physically Abused: No    Sexually Abused: No     Current Outpatient Medications:    aspirin 81 MG tablet, Take 81  mg by mouth daily., Disp: , Rfl:    atorvastatin (LIPITOR) 10 MG tablet, Take 1 tablet (10 mg total) by mouth daily., Disp: 90 tablet, Rfl: 1   clotrimazole-betamethasone (LOTRISONE) cream, Apply 1 Application topically daily., Disp: 30 g, Rfl: 0   hydrochlorothiazide (HYDRODIURIL) 25 MG tablet, Take 1 tablet (25 mg total) by mouth daily., Disp: 90 tablet, Rfl: 1   lisinopril (ZESTRIL) 40 MG tablet, Take 1 tablet (40 mg total) by mouth daily., Disp: 90 tablet, Rfl: 1   nortriptyline (PAMELOR) 50 MG capsule, Take 1 capsule (50 mg total) by mouth at bedtime., Disp: 90 capsule, Rfl: 1  No Known Allergies   Review of Systems  Constitutional:  Negative for chills, fever, malaise/fatigue and weight loss.  HENT:  Negative for hearing loss, nosebleeds, sore throat and tinnitus.   Eyes:  Negative for blurred vision, double vision and photophobia.  Respiratory:  Negative for cough, shortness of breath and wheezing.   Cardiovascular:  Negative for chest pain, palpitations and leg swelling.  Gastrointestinal:  Negative for blood in stool, constipation, diarrhea, heartburn, nausea and vomiting.  Genitourinary:  Negative for dysuria, frequency and hematuria.  Musculoskeletal:  Negative for falls, joint pain and myalgias.  Skin:  Negative for itching and rash.  Neurological:  Positive for tingling (reports tingling in her right hand and trigger finger of right middle finger). Negative for dizziness, tremors, loss of consciousness, weakness and headaches.  Psychiatric/Behavioral:  Positive for depression. Negative for suicidal ideas. The patient is nervous/anxious.       Objective  Vitals:   12/12/22 1404 12/12/22 1411  BP: (!) 181/83 (!) 166/91  Pulse: 82 84  Temp: 98.3 F (  36.8 C)   TempSrc: Oral   SpO2: 98%   Weight: 216 lb 6.4 oz (98.2 kg)   Height: 5\' 3"  (1.6 m)     Body mass index is 38.33 kg/m.  Physical Exam Vitals reviewed.  Constitutional:      General: She is awake.      Appearance: Normal appearance. She is well-developed and well-groomed.  HENT:     Head: Normocephalic and atraumatic.     Right Ear: Hearing, tympanic membrane and ear canal normal.     Left Ear: Hearing, tympanic membrane and ear canal normal.     Mouth/Throat:     Lips: Pink.     Mouth: Mucous membranes are moist.     Pharynx: Uvula midline. No pharyngeal swelling, oropharyngeal exudate or posterior oropharyngeal erythema.  Eyes:     General: Lids are normal. Gaze aligned appropriately.     Extraocular Movements: Extraocular movements intact.     Conjunctiva/sclera: Conjunctivae normal.     Pupils: Pupils are equal, round, and reactive to light.  Neck:     Thyroid: No thyroid mass, thyromegaly or thyroid tenderness.  Cardiovascular:     Rate and Rhythm: Normal rate and regular rhythm.     Pulses: Normal pulses.          Radial pulses are 2+ on the right side and 2+ on the left side.     Heart sounds: Normal heart sounds. No murmur heard.    No friction rub. No gallop.  Pulmonary:     Effort: Pulmonary effort is normal.     Breath sounds: Normal breath sounds. No decreased air movement. No decreased breath sounds, wheezing, rhonchi or rales.  Abdominal:     General: Abdomen is flat. Bowel sounds are normal.     Palpations: Abdomen is soft.     Tenderness: There is no abdominal tenderness.  Musculoskeletal:     Cervical back: Normal range of motion and neck supple.     Right lower leg: No edema.     Left lower leg: No edema.  Lymphadenopathy:     Head:     Right side of head: No submental, submandibular or preauricular adenopathy.     Left side of head: No submental, submandibular or preauricular adenopathy.     Cervical:     Right cervical: No superficial cervical adenopathy.    Left cervical: No superficial cervical adenopathy.     Upper Body:     Right upper body: No supraclavicular adenopathy.     Left upper body: No supraclavicular adenopathy.  Skin:    General: Skin  is warm and dry.  Neurological:     General: No focal deficit present.     Mental Status: She is alert and oriented to person, place, and time.     GCS: GCS eye subscore is 4. GCS verbal subscore is 5. GCS motor subscore is 6.     Cranial Nerves: No cranial nerve deficit, dysarthria or facial asymmetry.     Motor: No weakness, tremor, atrophy or abnormal muscle tone.     Gait: Gait is intact.  Psychiatric:        Attention and Perception: Attention and perception normal.        Mood and Affect: Mood and affect normal.        Speech: Speech normal.        Behavior: Behavior normal. Behavior is cooperative.        Thought Content: Thought content normal.  Cognition and Memory: Cognition and memory normal.        Judgment: Judgment normal.      No results found for this or any previous visit (from the past 2160 hour(s)).   Fall Risk:    12/12/2022    2:10 PM 09/02/2022    8:32 AM 03/21/2022    8:13 AM 06/14/2021   11:05 AM 05/17/2021    1:53 PM  Fall Risk   Falls in the past year? 0 0 0 0 0  Number falls in past yr: 0 0 0 0 0  Injury with Fall? 0 0 0 0 0  Risk for fall due to : No Fall Risks  No Fall Risks No Fall Risks No Fall Risks  Follow up Falls evaluation completed  Falls evaluation completed Falls evaluation completed Falls evaluation completed     Functional Status Survey:     Assessment & Plan  Problem List Items Addressed This Visit       Other   Prediabetes   Relevant Orders   HgB A1c   Other Visit Diagnoses     Annual physical exam    -  Primary  -USPSTF grade A and B recommendations reviewed with patient; age-appropriate recommendations, preventive care, screening tests, etc discussed and encouraged; healthy living encouraged; see AVS for patient education given to patient -Discussed importance of 150 minutes of physical activity weekly, eat two servings of fish weekly, eat one serving of tree nuts ( cashews, pistachios, pecans, almonds.Marland Kitchen) every  other day, eat 6 servings of fruit/vegetables daily and drink plenty of water and avoid sweet beverages.   -Reviewed Health Maintenance: Yes.      Relevant Orders   HgB A1c   MM 3D SCREENING MAMMOGRAM BILATERAL BREAST   Flu Vaccine Trivalent High Dose (Fluad)   Need for influenza vaccination       Relevant Orders   Flu Vaccine Trivalent High Dose (Fluad)   Encounter for screening mammogram for malignant neoplasm of breast       Relevant Orders   Flu Vaccine Trivalent High Dose (Fluad)        Return in about 3 months (around 03/13/2023) for HTN, HLD, prediabetes .   I, Merlyn Conley E Kobey Sides, PA-C, have reviewed all documentation for this visit. The documentation on 12/12/22 for the exam, diagnosis, procedures, and orders are all accurate and complete.   Jacquelin Hawking, MHS, PA-C [facility]

## 2022-12-13 LAB — HEMOGLOBIN A1C
Est. average glucose Bld gHb Est-mCnc: 134 mg/dL
Hgb A1c MFr Bld: 6.3 % — ABNORMAL HIGH (ref 4.8–5.6)

## 2022-12-13 NOTE — Progress Notes (Signed)
Your A1c is stable at 6.3 which is still in the prediabetic range.  Please continue with current measures - diet and exercise to help prevent progression to type 2 diabetes

## 2023-01-01 ENCOUNTER — Ambulatory Visit: Payer: Self-pay

## 2023-01-01 NOTE — Telephone Encounter (Addendum)
Chief Complaint: Lip swollen Symptoms: Lip swollen and Facial swelling Frequency: Constant Pertinent Negatives: Patient denies pain, itching, and other symptoms Disposition: [] ED /[] Urgent Care (no appt availability in office) / [] Appointment(In office/virtual)/ []  [facility]/ [] Home Care/ [x] Refused Recommended Disposition /[] [place] [facility]/ []  Follow-up with PCP Additional Notes: Patient stated she noticed her lips were mildly swollen yesterday but did not think much about it. Today her lips are moderately swollen and she also has facial swelling. Patient denies pain or itching in the face and lips. Asked patient if she was taking medication for her blood pressure and patient confirmed she is taking Lisinopril 40 MG daily. Patient stated she thinks the lip swelling started shortly after taking the medicine yesterday. Care advice was given and patient was advised to got to the ED. Patient verbalized understanding and stated she will go now.  Patient called back and stated her daughter is a Engineer, civil (consulting) and stated she did not need to go to the ED she just needs to stop taking the Lisinopril. She stated that this is not the first time she has swelling from the medication and her daughter said she just needs to be taking off the medication and given something different. I advised the patient I still recommend she go to the ED for evaluation and patient declined at this time. Patient has been scheduled to follow-up with PCP 01/16/23. Reason for Disposition  Taking an ACE Inhibitor medicine (e.g., benazepril / LOTENSIN, captopril / CAPOTEN, enalapril / VASOTEC, lisinopril / ZESTRIL)  Answer Assessment - Initial Assessment Questions 1. ONSET: "When did the swelling start?" (e.g., minutes, hours, days)     Yesterday 2. SEVERITY: "How swollen is it?"     Moderate 3. ITCHING: "Is there any itching?" If Yes, ask: "How much?"   (Scale 1-10; mild, moderate or severe)     No 4. PAIN: "Is the  swelling painful to touch?" If Yes, ask: "How painful is it?"   (Scale 1-10; mild, moderate or severe)     No pain  5. CAUSE: "What do you think is causing the lip swelling?"     I don't know  6. RECURRENT SYMPTOM: "Have you had lip swelling before?" If Yes, ask: "When was the last time?" "What happened that time?"     Yes, sometimes my lips swell when I bite them  7. OTHER SYMPTOMS: "Do you have any other symptoms?" (e.g., toothache)     No  Protocols used: Lip Swelling-A-AH

## 2023-01-01 NOTE — Telephone Encounter (Signed)
Patient should stop the Lisinopril and come in for an appointment.  She should be seen before 10/31.

## 2023-01-01 NOTE — Telephone Encounter (Signed)
Appointment has been made

## 2023-01-05 NOTE — Patient Instructions (Signed)
Angioedema Angioedema is swelling in the body. The swelling can occur in any part of the body. It can affect any part of the body, including the legs, hands, genitals, face, mouth, lips, and organs. It may cause itchy, red, swollen areas of skin (hives) to form. This condition may: Happen only one time. Happen more than one time. It can also stop at any time. Keep coming back for a number of years. Someday it may stop. What are the causes? This condition may be caused by: Foods, such as milk, eggs, shellfish, wheat, or nuts. Medicines, such as ACE inhibitors, antibiotics, birth control pills, dyes used in X-rays or NSAIDs such as ibuprofen. Hereditary angioedema (HAE) is passed from parent to child. Symptoms can occur because of: Illness. Infection. Stress. Changes in hormones. Exercise. Minor surgery. Dental work. In some cases, the cause of this condition may not be known. What increases the risk? You are more likely to have HAE if you have family members with this condition. What are the signs or symptoms? Symptoms of this condition include: Swollen skin. Itchy, red, swollen areas of skin. Pain, pressure, or tenderness in the affected area. Swollen eyelids, face, lips, or tongue. Trouble drinking, swallowing, or fully closing the mouth. Being hoarse or having a sore throat. Wheezing. Trouble breathing. If your organs are affected, you may: Feel like vomiting. Have pain in your belly (abdomen). Vomit or have watery poop (diarrhea). Have trouble swallowing. Have trouble peeing. How is this treated? To treat this condition, you may be told: To avoid things that cause attacks (triggers). These include foods or things that cause allergies. To stop medicines that cause the condition. To take medicines to treat the condition. In very bad cases, a breathing tube or a machine that helps with breathing (ventilator) may be used. Follow these instructions at home:  Take all  medicines only as told by your doctor. If you were given medicines to treat allergies, always carry them with you. Wear a medical bracelet as told by your doctor. Avoid the things that cause attacks. These may include: Foods. Things in your environment (such as pollen). Stress. Exercise. Avoid all medicines that caused the attacks. Talk to your doctor before you have kids. Some types of this condition may be passed from parent to child. Where to find more information American Academy of Allergy Asthma & Immunology: www.aaaai.org Contact a doctor if: You have another attack. Your attacks happen more often, even after you take steps to prevent them. Your attacks are worse every time they occur. You are thinking about having kids. Get help right away if: Your mouth, tongue, or lips get very swollen. Your swelling gets worse. You have trouble breathing or swallowing. You have trouble talking. You have chest pain. You feel dizzy. You feel light-headed. You faint. These symptoms may be an emergency. Get help right away. Call your local emergency services (911 in the [place] Do not wait to see if the symptoms will go away. Do not drive yourself to the hospital. Summary Angioedema is swelling in the body. Angioedema can be caused by the food you eat or the medicines you take. Avoid the things that cause your attacks. These can be food, medicines, or things in your environment. If you were given medicines for allergies, always carry them with you. Get help right away if your mouth, tongue, or lips get swollen. Also, get help right away if you have trouble breathing or swallowing. This information is not intended to replace advice given  to you by your health care provider. Make sure you discuss any questions you have with your health care provider. Document Revised: 07/05/2020 Document Reviewed: 07/05/2020 Elsevier Patient Education  2024 [employer].

## 2023-01-08 ENCOUNTER — Encounter: Payer: Self-pay | Admitting: Nurse Practitioner

## 2023-01-08 ENCOUNTER — Ambulatory Visit (INDEPENDENT_AMBULATORY_CARE_PROVIDER_SITE_OTHER): Payer: 59 | Admitting: Nurse Practitioner

## 2023-01-08 VITALS — BP 143/82 | HR 74 | Ht 63.0 in | Wt 217.0 lb

## 2023-01-08 DIAGNOSIS — I10 Essential (primary) hypertension: Secondary | ICD-10-CM

## 2023-01-08 MED ORDER — AMLODIPINE BESYLATE 5 MG PO TABS: 5.0000 mg | ORAL_TABLET | Freq: Every day | ORAL | 4 refills | Status: DC

## 2023-01-08 NOTE — Assessment & Plan Note (Signed)
Chronic, ongoing.  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  We have stopped this and she is aware not to restart and can not take ACE in future.  BP running slightly above goal without it on board.  Will restart Amlodipine which she took in 2016 without issue, it was stopped due to stable BP at the time.  Start 5 MG daily, educated her on this and side effects.  Maintain Hydrochlorothiazide at current dose.  Recommend she monitor BP at least a few mornings a week at home and document.  DASH diet at home.  Labs today: up to date.  Return in 4 weeks.

## 2023-01-08 NOTE — Progress Notes (Signed)
BP (!) 143/82 (BP Location: Left Arm, Cuff Size: Normal)   Pulse 74   Ht 5\' 3"  (1.6 m)   Wt 217 lb (98.4 kg)   LMP  (LMP Unknown)   SpO2 97%   BMI 38.44 kg/m    Subjective:    Patient ID: Ashley Mcknight, female    DOB: Aug 06, 1950, 72 y.o.   MRN: [id number]  HPI: Ashley Mcknight is a 72 y.o. female  Chief Complaint  Patient presents with   Facial Swelling    Patient says about two weeks ago, she woke up with some facial swelling and was told to stop her Lisinopril prescription. Patient says that day she took two Benadryl and Tylenol and it helped the swelling go down. Patient has since stopped the Lisinopril, but still has some tenderness in L side of her face. Patient says she hasn't noticed any swelling.    HYPERTENSION without Chronic Kidney Disease Presents today due to facial swelling with Lisinopril, she stopped this and no further swelling present.  At the time had no issues with breathing, but her lip and face on left side were very swollen.  Continues to have some tenderness to left side of face, but she thinks this is her partial which is irritating her.  When had swelling took Benadryl and Ibuprofen, which helped clear it.  Currently taking Hydrochlorothiazide for blood pressure.  In past took Amlodipine in 2016 -- was stopped due to improved BP at the time. Hypertension status: stable  Satisfied with current treatment? yes Duration of hypertension: chronic BP monitoring frequency:  a few times a week BP range: 150-160/80 range since stopping Lisinopril BP medication side effects:  no Medication compliance: good compliance Previous BP meds: Lisinopril (swelling) Aspirin: no Recurrent headaches: no Visual changes: no Palpitations: no Dyspnea: no Chest pain: no Lower extremity edema:  occasional to ankles when on feet all night at work Dizzy/lightheaded: no  Relevant past medical, surgical, family and social history reviewed and updated as indicated. Interim  medical history since our last visit reviewed. Allergies and medications reviewed and updated.  Review of Systems  Constitutional:  Negative for activity change, appetite change, diaphoresis, fatigue and fever.  Respiratory:  Negative for cough, chest tightness, shortness of breath and wheezing.   Cardiovascular:  Negative for chest pain, palpitations and leg swelling.  Gastrointestinal:  Negative for abdominal distention, abdominal pain, constipation, diarrhea, nausea and vomiting.  Endocrine: Negative for cold intolerance, heat intolerance, polydipsia, polyphagia and polyuria.  Neurological:  Negative for dizziness, syncope, weakness, light-headedness, numbness and headaches.  Psychiatric/Behavioral: Negative.     Per HPI unless specifically indicated above     Objective:    BP (!) 143/82 (BP Location: Left Arm, Cuff Size: Normal)   Pulse 74   Ht 5\' 3"  (1.6 m)   Wt 217 lb (98.4 kg)   LMP  (LMP Unknown)   SpO2 97%   BMI 38.44 kg/m   Wt Readings from Last 3 Encounters:  01/08/23 217 lb (98.4 kg)  12/12/22 216 lb 6.4 oz (98.2 kg)  09/02/22 214 lb 8 oz (97.3 kg)    Physical Exam Vitals and nursing note reviewed.  Constitutional:      General: She is awake. She is not in acute distress.    Appearance: She is well-developed and well-groomed. She is obese. She is not ill-appearing or toxic-appearing.  HENT:     Head: Normocephalic.     Right Ear: Hearing and external ear normal.  Left Ear: Hearing and external ear normal.  Eyes:     General: Lids are normal.        Right eye: No discharge.        Left eye: No discharge.     Conjunctiva/sclera: Conjunctivae normal.     Pupils: Pupils are equal, round, and reactive to light.  Neck:     Thyroid: No thyromegaly.     Vascular: No carotid bruit.  Cardiovascular:     Rate and Rhythm: Normal rate and regular rhythm.     Heart sounds: Normal heart sounds. No murmur heard.    No gallop.  Pulmonary:     Effort: Pulmonary  effort is normal. No accessory muscle usage or respiratory distress.     Breath sounds: Normal breath sounds.  Abdominal:     General: Bowel sounds are normal. There is no distension.     Palpations: Abdomen is soft.     Tenderness: There is no abdominal tenderness.  Musculoskeletal:     Cervical back: Normal range of motion and neck supple.     Right lower leg: Edema (trace) present.     Left lower leg: Edema (trace) present.  Lymphadenopathy:     Cervical: No cervical adenopathy.  Skin:    General: Skin is warm and dry.  Neurological:     Mental Status: She is alert and oriented to person, place, and time.     Deep Tendon Reflexes: Reflexes are normal and symmetric.     Reflex Scores:      Brachioradialis reflexes are 2+ on the right side and 2+ on the left side.      Patellar reflexes are 2+ on the right side and 2+ on the left side. Psychiatric:        Attention and Perception: Attention normal.        Mood and Affect: Mood normal.        Speech: Speech normal.        Behavior: Behavior normal. Behavior is cooperative.        Thought Content: Thought content normal.     Results for orders placed or performed in visit on 12/12/22  HgB A1c  Result Value Ref Range   Hgb A1c MFr Bld 6.3 (H) 4.8 - 5.6 %   Est. average glucose Bld gHb Est-mCnc 134 mg/dL      Assessment & Plan:   Problem List Items Addressed This Visit       Cardiovascular and Mediastinum   Hypertension - Primary    Chronic, ongoing.  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  We have stopped this and she is aware not to restart and can not take ACE in future.  BP running slightly above goal without it on board.  Will restart Amlodipine which she took in 2016 without issue, it was stopped due to stable BP at the time.  Start 5 MG daily, educated her on this and side effects.  Maintain Hydrochlorothiazide at current dose.  Recommend she monitor BP at least a few mornings a week at home  and document.  DASH diet at home.  Labs today: up to date.  Return in 4 weeks.       Relevant Medications   EPINEPHrine 0.3 mg/0.3 mL IJ SOAJ injection   amLODipine (NORVASC) 5 MG tablet     Follow up plan: Return in about 4 weeks (around 02/05/2023), or HTN -- added Amlodipine & discuss weight loss regimen + cancel  01/16/23 visit.

## 2023-01-16 ENCOUNTER — Ambulatory Visit: Payer: 59 | Admitting: Nurse Practitioner

## 2023-02-05 ENCOUNTER — Ambulatory Visit: Payer: 59 | Admitting: Nurse Practitioner

## 2023-02-05 NOTE — Progress Notes (Deleted)
LMP  (LMP Unknown)    Subjective:    Patient ID: Ashley Mcknight, female    DOB: Jan 22, 1951, 72 y.o.   MRN: [id number]  HPI: Ashley Mcknight is a 72 y.o. female  No chief complaint on file.  HYPERTENSION without Chronic Kidney Disease Presents today due to facial swelling with Lisinopril, she stopped this and no further swelling present.  At the time had no issues with breathing, but her lip and face on left side were very swollen.  Continues to have some tenderness to left side of face, but she thinks this is her partial which is irritating her.  When had swelling took Benadryl and Ibuprofen, which helped clear it.  Currently taking Hydrochlorothiazide for blood pressure.  In past took Amlodipine in 2016 -- was stopped due to improved BP at the time. Hypertension status: stable  Satisfied with current treatment? yes Duration of hypertension: chronic BP monitoring frequency:  a few times a week BP range: 150-160/80 range since stopping Lisinopril BP medication side effects:  no Medication compliance: good compliance Previous BP meds: Lisinopril (swelling) Aspirin: no Recurrent headaches: no Visual changes: no Palpitations: no Dyspnea: no Chest pain: no Lower extremity edema:  occasional to ankles when on feet all night at work Dizzy/lightheaded: no  Relevant past medical, surgical, family and social history reviewed and updated as indicated. Interim medical history since our last visit reviewed. Allergies and medications reviewed and updated.  Review of Systems  Constitutional:  Negative for activity change, appetite change, diaphoresis, fatigue and fever.  Respiratory:  Negative for cough, chest tightness, shortness of breath and wheezing.   Cardiovascular:  Negative for chest pain, palpitations and leg swelling.  Gastrointestinal:  Negative for abdominal distention, abdominal pain, constipation, diarrhea, nausea and vomiting.  Endocrine: Negative for cold intolerance,  heat intolerance, polydipsia, polyphagia and polyuria.  Neurological:  Negative for dizziness, syncope, weakness, light-headedness, numbness and headaches.  Psychiatric/Behavioral: Negative.     Per HPI unless specifically indicated above     Objective:    LMP  (LMP Unknown)   Wt Readings from Last 3 Encounters:  01/08/23 217 lb (98.4 kg)  12/12/22 216 lb 6.4 oz (98.2 kg)  09/02/22 214 lb 8 oz (97.3 kg)    Physical Exam Vitals and nursing note reviewed.  Constitutional:      General: She is awake. She is not in acute distress.    Appearance: She is well-developed and well-groomed. She is obese. She is not ill-appearing or toxic-appearing.  HENT:     Head: Normocephalic.     Right Ear: Hearing and external ear normal.     Left Ear: Hearing and external ear normal.  Eyes:     General: Lids are normal.        Right eye: No discharge.        Left eye: No discharge.     Conjunctiva/sclera: Conjunctivae normal.     Pupils: Pupils are equal, round, and reactive to light.  Neck:     Thyroid: No thyromegaly.     Vascular: No carotid bruit.  Cardiovascular:     Rate and Rhythm: Normal rate and regular rhythm.     Heart sounds: Normal heart sounds. No murmur heard.    No gallop.  Pulmonary:     Effort: Pulmonary effort is normal. No accessory muscle usage or respiratory distress.     Breath sounds: Normal breath sounds.  Abdominal:     General: Bowel sounds are normal. There is no  distension.     Palpations: Abdomen is soft.     Tenderness: There is no abdominal tenderness.  Musculoskeletal:     Cervical back: Normal range of motion and neck supple.     Right lower leg: Edema (trace) present.     Left lower leg: Edema (trace) present.  Lymphadenopathy:     Cervical: No cervical adenopathy.  Skin:    General: Skin is warm and dry.  Neurological:     Mental Status: She is alert and oriented to person, place, and time.     Deep Tendon Reflexes: Reflexes are normal and  symmetric.     Reflex Scores:      Brachioradialis reflexes are 2+ on the right side and 2+ on the left side.      Patellar reflexes are 2+ on the right side and 2+ on the left side. Psychiatric:        Attention and Perception: Attention normal.        Mood and Affect: Mood normal.        Speech: Speech normal.        Behavior: Behavior normal. Behavior is cooperative.        Thought Content: Thought content normal.    Results for orders placed or performed in visit on 12/12/22  HgB A1c  Result Value Ref Range   Hgb A1c MFr Bld 6.3 (H) 4.8 - 5.6 %   Est. average glucose Bld gHb Est-mCnc 134 mg/dL      Assessment & Plan:   Problem List Items Addressed This Visit   None     Follow up plan: No follow-ups on file.

## 2023-02-10 ENCOUNTER — Other Ambulatory Visit: Payer: Self-pay | Admitting: Physician Assistant

## 2023-02-10 DIAGNOSIS — Z1231 Encounter for screening mammogram for malignant neoplasm of breast: Secondary | ICD-10-CM

## 2023-03-13 ENCOUNTER — Ambulatory Visit (INDEPENDENT_AMBULATORY_CARE_PROVIDER_SITE_OTHER): Payer: 59 | Admitting: Nurse Practitioner

## 2023-03-13 ENCOUNTER — Encounter: Payer: Self-pay | Admitting: Nurse Practitioner

## 2023-03-13 VITALS — BP 145/84 | HR 78 | Temp 97.5°F | Ht 63.0 in | Wt 216.0 lb

## 2023-03-13 DIAGNOSIS — E785 Hyperlipidemia, unspecified: Secondary | ICD-10-CM | POA: Diagnosis not present

## 2023-03-13 DIAGNOSIS — Z6839 Body mass index (BMI) 39.0-39.9, adult: Secondary | ICD-10-CM

## 2023-03-13 DIAGNOSIS — N183 Chronic kidney disease, stage 3 unspecified: Secondary | ICD-10-CM | POA: Diagnosis not present

## 2023-03-13 DIAGNOSIS — R7303 Prediabetes: Secondary | ICD-10-CM

## 2023-03-13 DIAGNOSIS — F322 Major depressive disorder, single episode, severe without psychotic features: Secondary | ICD-10-CM | POA: Diagnosis not present

## 2023-03-13 DIAGNOSIS — E66812 Obesity, class 2: Secondary | ICD-10-CM

## 2023-03-13 DIAGNOSIS — I10 Essential (primary) hypertension: Secondary | ICD-10-CM

## 2023-03-13 MED ORDER — AMLODIPINE BESYLATE 10 MG PO TABS: 10.0000 mg | ORAL_TABLET | Freq: Every day | ORAL | 1 refills | Status: DC

## 2023-03-13 MED ORDER — ATORVASTATIN CALCIUM 10 MG PO TABS: 10.0000 mg | ORAL_TABLET | Freq: Every day | ORAL | 1 refills | Status: DC

## 2023-03-13 MED ORDER — HYDROCHLOROTHIAZIDE 25 MG PO TABS: 25.0000 mg | ORAL_TABLET | Freq: Every day | ORAL | 1 refills | Status: DC

## 2023-03-13 NOTE — Assessment & Plan Note (Signed)
Chronic.  Not well controlled.  Will increase Amlodipine to 10mg .  Educated patient on possibility of Angioedema.  Continue hydrochlorothiazide.  Labs ordered.  Follow up in 6 weeks.  Call sooner if concerns arise.

## 2023-03-13 NOTE — Assessment & Plan Note (Signed)
Chronic.  Controlled.  Continue with current medication regimen.  Had Angioedema with ACE.  Labs ordered today.  Return to clinic in 6 months for reevaluation.  Call sooner if concerns arise.

## 2023-03-13 NOTE — Assessment & Plan Note (Signed)
Chronic.  Controlled.  Continue with current medication regimen.  Labs ordered today.  Return to clinic in 6 months for reevaluation.  Call sooner if concerns arise.  ? ?

## 2023-03-13 NOTE — Assessment & Plan Note (Signed)
Recommended eating smaller high protein, low fat meals more frequently and exercising 30 mins a day 5 times a week with a goal of 10-15lb weight loss in the next 3 months.  

## 2023-03-13 NOTE — Assessment & Plan Note (Signed)
Chronic, Controlled. Patient is currently taking atorvastatin 10 mg p.o. daily and appears to be tolerating well Recheck cholesterol panel today-results to dictate further management Continue current regimen Follow-up in 6 months for monitoring or sooner if concerns arise

## 2023-03-13 NOTE — Assessment & Plan Note (Signed)
Chronic.  Controlled.  Stopped Nortriptyline. Labs ordered today.  Return to clinic in 6 months for reevaluation.  Call sooner if concerns arise.

## 2023-03-13 NOTE — Progress Notes (Signed)
BP (!) 145/84   Pulse 78   Temp (!) 97.5 F (36.4 C) (Oral)   Ht 5\' 3"  (1.6 m)   Wt 216 lb (98 kg)   LMP  (LMP Unknown)   SpO2 98%   BMI 38.26 kg/m    Subjective:    Patient ID: Ashley Mcknight, female    DOB: 01-04-1951, 72 y.o.   MRN: [id number]  HPI: Ashley Mcknight is a 72 y.o. female  Chief Complaint  Patient presents with   3 month follow up   Hyperlipidemia   Hypertension   Diabetes   Weight Management Screening   HYPERTENSION / HYPERLIPIDEMIA Satisfied with current treatment? yes Duration of hypertension: years BP monitoring frequency: not checking BP range:  BP medication side effects: no Past BP meds: Amlodipine HCTZ Duration of hyperlipidemia: years Cholesterol medication side effects: no Cholesterol supplements: none Past cholesterol medications: atorvastain (lipitor) Medication compliance: excellent compliance Aspirin: no Recent stressors: no Recurrent headaches: no Visual changes: no Palpitations: no Dyspnea: no Chest pain: no Lower extremity edema: no Dizzy/lightheaded: no  CHRONIC KIDNEY DISEASE CKD status: controlled Medications renally dose: yes Previous renal evaluation: no Pneumovax:  Up to Date Influenza Vaccine:  Up to Date  Relevant past medical, surgical, family and social history reviewed and updated as indicated. Interim medical history since our last visit reviewed. Allergies and medications reviewed and updated.  Review of Systems  Eyes:  Negative for visual disturbance.  Respiratory:  Negative for cough, chest tightness and shortness of breath.   Cardiovascular:  Negative for chest pain, palpitations and leg swelling.  Neurological:  Negative for dizziness and headaches.    Per HPI unless specifically indicated above     Objective:    BP (!) 145/84   Pulse 78   Temp (!) 97.5 F (36.4 C) (Oral)   Ht 5\' 3"  (1.6 m)   Wt 216 lb (98 kg)   LMP  (LMP Unknown)   SpO2 98%   BMI 38.26 kg/m   Wt Readings from Last  3 Encounters:  03/13/23 216 lb (98 kg)  01/08/23 217 lb (98.4 kg)  12/12/22 216 lb 6.4 oz (98.2 kg)    Physical Exam Vitals and nursing note reviewed.  Constitutional:      General: She is not in acute distress.    Appearance: Normal appearance. She is obese. She is not ill-appearing, toxic-appearing or diaphoretic.  HENT:     Head: Normocephalic.     Right Ear: External ear normal.     Left Ear: External ear normal.     Nose: Nose normal.     Mouth/Throat:     Mouth: Mucous membranes are moist.     Pharynx: Oropharynx is clear.  Eyes:     General:        Right eye: No discharge.        Left eye: No discharge.     Extraocular Movements: Extraocular movements intact.     Conjunctiva/sclera: Conjunctivae normal.     Pupils: Pupils are equal, round, and reactive to light.  Cardiovascular:     Rate and Rhythm: Normal rate and regular rhythm.     Heart sounds: No murmur heard. Pulmonary:     Effort: Pulmonary effort is normal. No respiratory distress.     Breath sounds: Normal breath sounds. No wheezing or rales.  Musculoskeletal:     Cervical back: Normal range of motion and neck supple.  Skin:    General: Skin is warm and dry.  Capillary Refill: Capillary refill takes less than 2 seconds.  Neurological:     General: No focal deficit present.     Mental Status: She is alert and oriented to person, place, and time. Mental status is at baseline.  Psychiatric:        Mood and Affect: Mood normal.        Behavior: Behavior normal.        Thought Content: Thought content normal.        Judgment: Judgment normal.     Results for orders placed or performed in visit on 12/12/22  HgB A1c   Collection Time: 12/12/22  2:49 PM  Result Value Ref Range   Hgb A1c MFr Bld 6.3 (H) 4.8 - 5.6 %   Est. average glucose Bld gHb Est-mCnc 134 mg/dL      Assessment & Plan:   Problem List Items Addressed This Visit       Cardiovascular and Mediastinum   Hypertension   Chronic.  Not  well controlled.  Will increase Amlodipine to 10mg .  Educated patient on possibility of Angioedema.  Continue hydrochlorothiazide.  Labs ordered.  Follow up in 6 weeks.  Call sooner if concerns arise.       Relevant Medications   amLODipine (NORVASC) 10 MG tablet   atorvastatin (LIPITOR) 10 MG tablet   hydrochlorothiazide (HYDRODIURIL) 25 MG tablet     Genitourinary   CKD (chronic kidney disease) stage 3, GFR 30-59 ml/min (HCC) - Primary   Chronic.  Controlled.  Continue with current medication regimen.  Had Angioedema with ACE.  Labs ordered today.  Return to clinic in 6 months for reevaluation.  Call sooner if concerns arise.       Relevant Orders   Comp Met (CMET)     Other   Obesity   Recommended eating smaller high protein, low fat meals more frequently and exercising 30 mins a day 5 times a week with a goal of 10-15lb weight loss in the next 3 months.       Hyperlipemia   Chronic, Controlled. Patient is currently taking atorvastatin 10 mg p.o. daily and appears to be tolerating well Recheck cholesterol panel today-results to dictate further management Continue current regimen Follow-up in 6 months for monitoring or sooner if concerns arise      Relevant Medications   amLODipine (NORVASC) 10 MG tablet   atorvastatin (LIPITOR) 10 MG tablet   hydrochlorothiazide (HYDRODIURIL) 25 MG tablet   Other Relevant Orders   Lipid Profile   Depression, major, single episode, severe (HCC)   Chronic.  Controlled.  Stopped Nortriptyline. Labs ordered today.  Return to clinic in 6 months for reevaluation.  Call sooner if concerns arise.       Prediabetes   Chronic.  Controlled.  Continue with current medication regimen.  Labs ordered today.  Return to clinic in 6 months for reevaluation.  Call sooner if concerns arise.        Relevant Orders   HgB A1c     Follow up plan: Return in about 6 weeks (around 04/24/2023) for BP Check.

## 2023-03-14 LAB — COMPREHENSIVE METABOLIC PANEL
ALT: 12 [IU]/L (ref 0–32)
AST: 17 [IU]/L (ref 0–40)
Albumin: 4.2 g/dL (ref 3.9–4.9)
Alkaline Phosphatase: 104 [IU]/L (ref 44–121)
BUN/Creatinine Ratio: 15 (ref 12–28)
BUN: 13 mg/dL (ref 8–27)
Bilirubin Total: 0.3 mg/dL (ref 0.0–1.2)
CO2: 22 mmol/L (ref 20–29)
Calcium: 9.6 mg/dL (ref 8.7–10.3)
Chloride: 101 mmol/L (ref 96–106)
Creatinine, Ser: 0.84 mg/dL (ref 0.57–1.00)
Globulin, Total: 2.7 g/dL (ref 1.5–4.5)
Glucose: 88 mg/dL (ref 70–99)
Potassium: 3.8 mmol/L (ref 3.5–5.2)
Sodium: 139 mmol/L (ref 134–144)
Total Protein: 6.9 g/dL (ref 6.0–8.5)
eGFR: 75 mL/min/{1.73_m2} (ref >59)

## 2023-03-14 LAB — LIPID PANEL
Chol/HDL Ratio: 2.4 {ratio} (ref 0.0–4.4)
Cholesterol, Total: 180 mg/dL (ref 100–199)
HDL: 75 mg/dL (ref >39)
LDL Chol Calc (NIH): 91 mg/dL (ref 0–99)
Triglycerides: 73 mg/dL (ref 0–149)
VLDL Cholesterol Cal: 14 mg/dL (ref 5–40)

## 2023-03-14 LAB — HEMOGLOBIN A1C
Est. average glucose Bld gHb Est-mCnc: 128 mg/dL
Hgb A1c MFr Bld: 6.1 % — ABNORMAL HIGH (ref 4.8–5.6)

## 2023-04-29 ENCOUNTER — Encounter: Payer: Self-pay | Admitting: Nurse Practitioner

## 2023-04-29 ENCOUNTER — Ambulatory Visit: Payer: 59 | Admitting: Nurse Practitioner

## 2023-04-29 VITALS — BP 149/85 | HR 87 | Ht 63.0 in | Wt 223.8 lb

## 2023-04-29 DIAGNOSIS — E66812 Obesity, class 2: Secondary | ICD-10-CM | POA: Diagnosis not present

## 2023-04-29 DIAGNOSIS — I10 Essential (primary) hypertension: Secondary | ICD-10-CM

## 2023-04-29 DIAGNOSIS — Z6839 Body mass index (BMI) 39.0-39.9, adult: Secondary | ICD-10-CM | POA: Diagnosis not present

## 2023-04-29 MED ORDER — OLMESARTAN MEDOXOMIL 20 MG PO TABS: 20.0000 mg | ORAL_TABLET | Freq: Every day | ORAL | 0 refills | Status: DC

## 2023-04-29 MED ORDER — SEMAGLUTIDE-WEIGHT MANAGEMENT 0.25 MG/0.5ML ~~LOC~~ SOAJ: 0.2500 mg | SUBCUTANEOUS | 0 refills | Status: AC

## 2023-04-29 MED ORDER — SEMAGLUTIDE-WEIGHT MANAGEMENT 0.5 MG/0.5ML ~~LOC~~ SOAJ: 0.5000 mg | SUBCUTANEOUS | 1 refills | Status: DC

## 2023-04-29 NOTE — Assessment & Plan Note (Signed)
Chronic. Not well controlled.  Will add Olmesartan.  Continue with Amlodipine and HCTZ.  Continue to check blood pressure at home and bring log to next visit.  Follow up in 1 month.  Call sooner if concerns arise.

## 2023-04-29 NOTE — Progress Notes (Signed)
BP (!) 149/85 (BP Location: Right Arm, Patient Position: Sitting, Cuff Size: Large)   Pulse 87   Ht 5\' 3"  (1.6 m)   Wt 223 lb 12.8 oz (101.5 kg)   LMP  (LMP Unknown)   SpO2 96%   BMI 39.64 kg/m    Subjective:    Patient ID: Ashley Mcknight, female    DOB: 1951-02-01, 73 y.o.   MRN: [id number]  HPI: Ashley Mcknight is a 73 y.o. female  Chief Complaint  Patient presents with   Blood Pressure Check   Weight Management Screening    Would like to discuss weight loss    HYPERTENSION without Chronic Kidney Disease Hypertension status: uncontrolled  Satisfied with current treatment? no Duration of hypertension: years BP monitoring frequency:  daily BP range: 140/80 BP medication side effects:  no Medication compliance: excellent compliance Previous BP meds:amlodipine and HCTZ Aspirin: no Recurrent headaches: no Visual changes: no Palpitations: no Dyspnea: no Chest pain: no Lower extremity edema: no Dizzy/lightheaded: no  WEIGHT GAIN Duration: years Previous attempts at weight loss: yes Complications of obesity: HTN/HLD Peak weight: 223lb Weight loss goal: <200 lb Weight loss to date: 6 months Requesting obesity pharmacotherapy: yes Current weight loss supplements/medications: no Previous weight loss supplements/meds: no Calories:    Relevant past medical, surgical, family and social history reviewed and updated as indicated. Interim medical history since our last visit reviewed. Allergies and medications reviewed and updated.  Review of Systems  Constitutional:  Positive for unexpected weight change.  Eyes:  Negative for visual disturbance.  Respiratory:  Negative for cough, chest tightness and shortness of breath.   Cardiovascular:  Negative for chest pain, palpitations and leg swelling.  Neurological:  Negative for dizziness and headaches.    Per HPI unless specifically indicated above     Objective:    BP (!) 149/85 (BP Location: Right Arm,  Patient Position: Sitting, Cuff Size: Large)   Pulse 87   Ht 5\' 3"  (1.6 m)   Wt 223 lb 12.8 oz (101.5 kg)   LMP  (LMP Unknown)   SpO2 96%   BMI 39.64 kg/m   Wt Readings from Last 3 Encounters:  04/29/23 223 lb 12.8 oz (101.5 kg)  03/13/23 216 lb (98 kg)  01/08/23 217 lb (98.4 kg)    Physical Exam Vitals and nursing note reviewed.  Constitutional:      General: She is not in acute distress.    Appearance: Normal appearance. She is obese. She is not ill-appearing, toxic-appearing or diaphoretic.  HENT:     Head: Normocephalic.     Right Ear: External ear normal.     Left Ear: External ear normal.     Nose: Nose normal.     Mouth/Throat:     Mouth: Mucous membranes are moist.     Pharynx: Oropharynx is clear.  Eyes:     General:        Right eye: No discharge.        Left eye: No discharge.     Extraocular Movements: Extraocular movements intact.     Conjunctiva/sclera: Conjunctivae normal.     Pupils: Pupils are equal, round, and reactive to light.  Cardiovascular:     Rate and Rhythm: Normal rate and regular rhythm.     Heart sounds: No murmur heard. Pulmonary:     Effort: Pulmonary effort is normal. No respiratory distress.     Breath sounds: Normal breath sounds. No wheezing or rales.  Musculoskeletal:  Cervical back: Normal range of motion and neck supple.  Skin:    General: Skin is warm and dry.     Capillary Refill: Capillary refill takes less than 2 seconds.  Neurological:     General: No focal deficit present.     Mental Status: She is alert and oriented to person, place, and time. Mental status is at baseline.  Psychiatric:        Mood and Affect: Mood normal.        Behavior: Behavior normal.        Thought Content: Thought content normal.        Judgment: Judgment normal.     Results for orders placed or performed in visit on 03/13/23  Comp Met (CMET)   Collection Time: 03/13/23  9:39 AM  Result Value Ref Range   Glucose 88 70 - 99 mg/dL   BUN  13 8 - 27 mg/dL   Creatinine, Ser 7.82 0.57 - 1.00 mg/dL   eGFR 75 >95 AO/ZHY/8.65   BUN/Creatinine Ratio 15 12 - 28   Sodium 139 134 - 144 mmol/L   Potassium 3.8 3.5 - 5.2 mmol/L   Chloride 101 96 - 106 mmol/L   CO2 22 20 - 29 mmol/L   Calcium 9.6 8.7 - 10.3 mg/dL   Total Protein 6.9 6.0 - 8.5 g/dL   Albumin 4.2 3.9 - 4.9 g/dL   Globulin, Total 2.7 1.5 - 4.5 g/dL   Bilirubin Total 0.3 0.0 - 1.2 mg/dL   Alkaline Phosphatase 104 44 - 121 IU/L   AST 17 0 - 40 IU/L   ALT 12 0 - 32 IU/L  Lipid Profile   Collection Time: 03/13/23  9:39 AM  Result Value Ref Range   Cholesterol, Total 180 100 - 199 mg/dL   Triglycerides 73 0 - 149 mg/dL   HDL 75 >78 mg/dL   VLDL Cholesterol Cal 14 5 - 40 mg/dL   LDL Chol Calc (NIH) 91 0 - 99 mg/dL   Chol/HDL Ratio 2.4 0.0 - 4.4 ratio  HgB A1c   Collection Time: 03/13/23  9:39 AM  Result Value Ref Range   Hgb A1c MFr Bld 6.1 (H) 4.8 - 5.6 %   Est. average glucose Bld gHb Est-mCnc 128 mg/dL      Assessment & Plan:   Problem List Items Addressed This Visit       Cardiovascular and Mediastinum   Hypertension - Primary   Chronic. Not well controlled.  Will add Olmesartan.  Continue with Amlodipine and HCTZ.  Continue to check blood pressure at home and bring log to next visit.  Follow up in 1 month.  Call sooner if concerns arise.       Relevant Medications   olmesartan (BENICAR) 20 MG tablet     Other   Obesity   Chronic.  Not well controlled.  BMI of 39.5. Patient has tried diet and exercise for weight loss without success.  Will start [facility] 0.25mg  weekly.  Will increase to [facility] 0.5mg  weekly after the first 4 weeks.  Discussed how to inject medication.  Discussed side effects and benefits of medication.  Follow up in 1 months.  Call sooner if concerns arise.         Relevant Medications   Semaglutide-Weight Management 0.25 MG/0.5ML SOAJ   Semaglutide-Weight Management 0.5 MG/0.5ML SOAJ (Start on 05/28/2023)     Follow up plan: Return  in about 1 month (around 05/27/2023) for BP Check, Weight Managment.

## 2023-04-29 NOTE — Assessment & Plan Note (Signed)
Chronic.  Not well controlled.  BMI of 39.5. Patient has tried diet and exercise for weight loss without success.  Will start [facility] 0.25mg  weekly.  Will increase to [facility] 0.5mg  weekly after the first 4 weeks.  Discussed how to inject medication.  Discussed side effects and benefits of medication.  Follow up in 1 months.  Call sooner if concerns arise.

## 2023-05-08 ENCOUNTER — Telehealth: Payer: Self-pay

## 2023-05-08 NOTE — Telephone Encounter (Signed)
Copied from CRM [id number]. Topic: Clinical - Medication Question >> May 08, 2023  1:50 PM Gery Pray wrote: Reason for CRM: Patient called in due to the [facility] costing 1500.00 per injestion. Patient like to know if there is an alternative to this medication. Please callback [phone]

## 2023-05-08 NOTE — Telephone Encounter (Signed)
Spoke to pt and agreed to call the insurance regarding weight management medication and will call back for more info.

## 2023-05-08 NOTE — Telephone Encounter (Signed)
Unable to reach pt-LVM to call the office back.

## 2023-05-08 NOTE — Telephone Encounter (Signed)
I recommend patient call her insurance company to find out if they cover weight loss medications.   It is likely that they do not since [facility] is not covered.

## 2023-05-09 ENCOUNTER — Telehealth: Payer: Self-pay

## 2023-05-09 NOTE — Telephone Encounter (Signed)
Routing to provider. Would weight loss pills be an option for her?   Copied from CRM [id number]. Topic: General - Call Back - No Documentation >> May 09, 2023  1:50 PM Ashley Mcknight wrote: Reason for CRM: Patient is requesting callback to discuss weightloss pill instead of injection. Patient'Mcknight insurance [facility]) does not accept any injections (Semaglutide-Weight Management 0.5 MG/0.5ML SOAJ). Callback number: [phone]

## 2023-05-12 NOTE — Telephone Encounter (Signed)
Called and notified patient of Karen's message.  

## 2023-05-12 NOTE — Telephone Encounter (Signed)
 Weight loss pills are not an option for her and would not be covered by her insurance.

## 2023-05-13 ENCOUNTER — Other Ambulatory Visit: Payer: Self-pay

## 2023-05-13 MED ORDER — AMLODIPINE BESYLATE 10 MG PO TABS: 10.0000 mg | ORAL_TABLET | Freq: Every day | ORAL | 1 refills | Status: DC

## 2023-05-13 MED ORDER — OLMESARTAN MEDOXOMIL 20 MG PO TABS: 20.0000 mg | ORAL_TABLET | Freq: Every day | ORAL | 1 refills | Status: DC

## 2023-05-13 MED ORDER — ATORVASTATIN CALCIUM 10 MG PO TABS: 10.0000 mg | ORAL_TABLET | Freq: Every day | ORAL | 1 refills | Status: DC

## 2023-05-13 NOTE — Telephone Encounter (Signed)
 Requesting prescriptions to now be sent to mail order pharmacy.

## 2023-05-28 ENCOUNTER — Encounter: Payer: Self-pay | Admitting: Nurse Practitioner

## 2023-05-28 ENCOUNTER — Ambulatory Visit: Payer: 59 | Admitting: Nurse Practitioner

## 2023-05-28 VITALS — BP 123/79 | HR 81 | Ht 63.0 in | Wt 221.8 lb

## 2023-05-28 DIAGNOSIS — F322 Major depressive disorder, single episode, severe without psychotic features: Secondary | ICD-10-CM

## 2023-05-28 DIAGNOSIS — I10 Essential (primary) hypertension: Secondary | ICD-10-CM

## 2023-05-28 MED ORDER — CITALOPRAM HYDROBROMIDE 10 MG PO TABS: 10.0000 mg | ORAL_TABLET | Freq: Every day | ORAL | 0 refills | Status: DC

## 2023-05-28 NOTE — Assessment & Plan Note (Signed)
 Chronic.  Not well controlled.  Will start Celexa 10mg  daily.  Side effects and benefits of medication discussed during visit.  Follow up in 3 months.  Call sooner if concerns arise.

## 2023-05-28 NOTE — Progress Notes (Signed)
 BP 123/79 (BP Location: Left Arm, Patient Position: Sitting, Cuff Size: Large)   Pulse 81   Ht 5\' 3"  (1.6 m)   Wt 221 lb 12.8 oz (100.6 kg)   LMP  (LMP Unknown)   SpO2 97%   BMI 39.29 kg/m    Subjective:    Patient ID: Ashley Mcknight, female    DOB: Sep 02, 1950, 73 y.o.   MRN: [id number]  HPI: Ashley Mcknight is a 73 y.o. female  Chief Complaint  Patient presents with   Blood Pressure Check   Weight Management Screening   Medication Management    BP medication is causing cramping in lower legs as well as numbness in fingers and toes   HYPERTENSION without Chronic Kidney Disease Patient states she stopped taking the Olmesartan about a week ago because she was having cramping.   Hypertension status: controlled Satisfied with current treatment? no Duration of hypertension: years BP monitoring frequency:  daily BP range: 140/80 BP medication side effects:  no Medication compliance: excellent compliance Previous BP meds:amlodipine and HCTZ Aspirin: no Recurrent headaches: no Visual changes: no Palpitations: no Dyspnea: no Chest pain: no Lower extremity edema: no Dizzy/lightheaded: no  MOOD Patient states she has been more sad lately thinking about her husband who passed about 3 months ago.   Every time she thinks about her husband she cries.  This has worsened over the last couple of months.    Relevant past medical, surgical, family and social history reviewed and updated as indicated. Interim medical history since our last visit reviewed. Allergies and medications reviewed and updated.  Review of Systems  Eyes:  Negative for visual disturbance.  Respiratory:  Negative for cough, chest tightness and shortness of breath.   Cardiovascular:  Negative for chest pain, palpitations and leg swelling.  Musculoskeletal:        Muscle cramping  Neurological:  Negative for dizziness and headaches.  Psychiatric/Behavioral:  Positive for dysphoric mood.     Per HPI  unless specifically indicated above     Objective:    BP 123/79 (BP Location: Left Arm, Patient Position: Sitting, Cuff Size: Large)   Pulse 81   Ht 5\' 3"  (1.6 m)   Wt 221 lb 12.8 oz (100.6 kg)   LMP  (LMP Unknown)   SpO2 97%   BMI 39.29 kg/m   Wt Readings from Last 3 Encounters:  05/28/23 221 lb 12.8 oz (100.6 kg)  04/29/23 223 lb 12.8 oz (101.5 kg)  03/13/23 216 lb (98 kg)    Physical Exam Vitals and nursing note reviewed.  Constitutional:      General: She is not in acute distress.    Appearance: Normal appearance. She is obese. She is not ill-appearing, toxic-appearing or diaphoretic.  HENT:     Head: Normocephalic.     Right Ear: External ear normal.     Left Ear: External ear normal.     Nose: Nose normal.     Mouth/Throat:     Mouth: Mucous membranes are moist.     Pharynx: Oropharynx is clear.  Eyes:     General:        Right eye: No discharge.        Left eye: No discharge.     Extraocular Movements: Extraocular movements intact.     Conjunctiva/sclera: Conjunctivae normal.     Pupils: Pupils are equal, round, and reactive to light.  Cardiovascular:     Rate and Rhythm: Normal rate and regular rhythm.  Heart sounds: No murmur heard. Pulmonary:     Effort: Pulmonary effort is normal. No respiratory distress.     Breath sounds: Normal breath sounds. No wheezing or rales.  Musculoskeletal:     Cervical back: Normal range of motion and neck supple.  Skin:    General: Skin is warm and dry.     Capillary Refill: Capillary refill takes less than 2 seconds.  Neurological:     General: No focal deficit present.     Mental Status: She is alert and oriented to person, place, and time. Mental status is at baseline.  Psychiatric:        Mood and Affect: Mood normal. Affect is tearful.        Behavior: Behavior normal.        Thought Content: Thought content normal.        Judgment: Judgment normal.     Results for orders placed or performed in visit on  03/13/23  Comp Met (CMET)   Collection Time: 03/13/23  9:39 AM  Result Value Ref Range   Glucose 88 70 - 99 mg/dL   BUN 13 8 - 27 mg/dL   Creatinine, Ser 1.61 0.57 - 1.00 mg/dL   eGFR 75 >09 UE/AVW/0.98   BUN/Creatinine Ratio 15 12 - 28   Sodium 139 134 - 144 mmol/L   Potassium 3.8 3.5 - 5.2 mmol/L   Chloride 101 96 - 106 mmol/L   CO2 22 20 - 29 mmol/L   Calcium 9.6 8.7 - 10.3 mg/dL   Total Protein 6.9 6.0 - 8.5 g/dL   Albumin 4.2 3.9 - 4.9 g/dL   Globulin, Total 2.7 1.5 - 4.5 g/dL   Bilirubin Total 0.3 0.0 - 1.2 mg/dL   Alkaline Phosphatase 104 44 - 121 IU/L   AST 17 0 - 40 IU/L   ALT 12 0 - 32 IU/L  Lipid Profile   Collection Time: 03/13/23  9:39 AM  Result Value Ref Range   Cholesterol, Total 180 100 - 199 mg/dL   Triglycerides 73 0 - 149 mg/dL   HDL 75 >11 mg/dL   VLDL Cholesterol Cal 14 5 - 40 mg/dL   LDL Chol Calc (NIH) 91 0 - 99 mg/dL   Chol/HDL Ratio 2.4 0.0 - 4.4 ratio  HgB A1c   Collection Time: 03/13/23  9:39 AM  Result Value Ref Range   Hgb A1c MFr Bld 6.1 (H) 4.8 - 5.6 %   Est. average glucose Bld gHb Est-mCnc 128 mg/dL      Assessment & Plan:   Problem List Items Addressed This Visit       Cardiovascular and Mediastinum   Hypertension - Primary   Chronic. Well controlled at visit today.  Olemsartan caused cramping.  She is no longer taking the medication.  Continue with Amlodipine and HCTZ.  Follow up in 3 months.  Call sooner if concerns arise.         Other   Depression, major, single episode, severe (HCC)   Chronic.  Not well controlled.  Will start Celexa 10mg  daily.  Side effects and benefits of medication discussed during visit.  Follow up in 3 months.  Call sooner if concerns arise.       Relevant Medications   citalopram (CELEXA) 10 MG tablet     Follow up plan: Return in about 3 months (around 08/28/2023) for HTN, HLD, DM2 FU.

## 2023-05-28 NOTE — Assessment & Plan Note (Signed)
 Chronic. Well controlled at visit today.  Olemsartan caused cramping.  She is no longer taking the medication.  Continue with Amlodipine and HCTZ.  Follow up in 3 months.  Call sooner if concerns arise.

## 2023-05-28 NOTE — Patient Instructions (Signed)
Magnesium supplement 

## 2023-07-29 ENCOUNTER — Other Ambulatory Visit: Payer: Self-pay | Admitting: Nurse Practitioner

## 2023-07-30 NOTE — Telephone Encounter (Signed)
 Not on current med list. Is the patient supposed to be on this?

## 2023-07-31 ENCOUNTER — Other Ambulatory Visit: Payer: Self-pay

## 2023-07-31 MED ORDER — CITALOPRAM HYDROBROMIDE 10 MG PO TABS: 10.0000 mg | ORAL_TABLET | Freq: Every day | ORAL | 1 refills | Status: DC

## 2023-07-31 MED ORDER — HYDROCHLOROTHIAZIDE 25 MG PO TABS: 25.0000 mg | ORAL_TABLET | Freq: Every day | ORAL | 1 refills | Status: DC

## 2023-07-31 NOTE — Telephone Encounter (Signed)
 Requesting to have prescriptions sent to mail order instead of local pharmacy.

## 2023-08-24 ENCOUNTER — Other Ambulatory Visit: Payer: Self-pay | Admitting: Nurse Practitioner

## 2023-08-25 NOTE — Telephone Encounter (Signed)
 Requested Prescriptions  Refused Prescriptions Disp Refills   citalopram  (CELEXA ) 10 MG tablet [Pharmacy Med Name: CITALOPRAM  HBR 10 MG TABLET] 90 tablet 1    Sig: TAKE 1 TABLET BY MOUTH EVERY DAY     Psychiatry:  Antidepressants - SSRI Passed - 08/25/2023  4:27 PM      Passed - Completed PHQ-2 or PHQ-9 in the last 360 days      Passed - Valid encounter within last 6 months    Recent Outpatient Visits           2 months ago Primary hypertension   [place] [facility] Aileen Alexanders, NP   3 months ago Primary hypertension   [place] [facility] Aileen Alexanders, NP

## 2023-09-02 ENCOUNTER — Ambulatory Visit: Admitting: Nurse Practitioner

## 2023-09-02 ENCOUNTER — Encounter: Payer: Self-pay | Admitting: Nurse Practitioner

## 2023-09-02 VITALS — BP 110/72 | HR 87 | Temp 98.2°F | Resp 15 | Ht 62.99 in | Wt 220.0 lb

## 2023-09-02 DIAGNOSIS — Z6839 Body mass index (BMI) 39.0-39.9, adult: Secondary | ICD-10-CM

## 2023-09-02 DIAGNOSIS — F322 Major depressive disorder, single episode, severe without psychotic features: Secondary | ICD-10-CM

## 2023-09-02 DIAGNOSIS — E785 Hyperlipidemia, unspecified: Secondary | ICD-10-CM

## 2023-09-02 DIAGNOSIS — Z1231 Encounter for screening mammogram for malignant neoplasm of breast: Secondary | ICD-10-CM

## 2023-09-02 DIAGNOSIS — I10 Essential (primary) hypertension: Secondary | ICD-10-CM | POA: Diagnosis not present

## 2023-09-02 DIAGNOSIS — E66812 Obesity, class 2: Secondary | ICD-10-CM

## 2023-09-02 DIAGNOSIS — N183 Chronic kidney disease, stage 3 unspecified: Secondary | ICD-10-CM

## 2023-09-02 DIAGNOSIS — R7303 Prediabetes: Secondary | ICD-10-CM

## 2023-09-02 NOTE — Assessment & Plan Note (Signed)
 Recommended eating smaller high protein, low fat meals more frequently and exercising 30 mins a day 5 times a week with a goal of 10-15lb weight loss in the next 3 months.

## 2023-09-02 NOTE — Progress Notes (Signed)
 BP 110/72 (BP Location: Right Arm, Patient Position: Sitting, Cuff Size: Large)   Pulse 87   Temp 98.2 F (36.8 C) (Oral)   Resp 15   Ht 5' 2.99 (1.6 m)   Wt 220 lb (99.8 kg)   LMP  (LMP Unknown)   SpO2 99%   BMI 38.98 kg/m    Subjective:    Patient ID: Ashley Mcknight, female    DOB: [birth year], 73 y.o.   MRN: [id number]  HPI: Katya Rolston is a 73 y.o. female  Chief Complaint  Patient presents with   Hypertension   Chronic Kidney Disease   Elbow Injury    Likely related to her lifting at work. Right side   Leg Swelling    Noticed over the weekend in her legs but has since gone down.    HYPERTENSION / HYPERLIPIDEMIA Satisfied with current treatment? yes Duration of hypertension: years BP monitoring frequency: not checking BP range:  BP medication side effects: no Past BP meds: Amlodipine  and HCTZ Duration of hyperlipidemia: years Cholesterol medication side effects: no Cholesterol supplements: none Past cholesterol medications: atorvastain (lipitor) Medication compliance: excellent compliance Aspirin: no Recent stressors: no Recurrent headaches: no Visual changes: no Palpitations: no Dyspnea: no Chest pain: no Lower extremity edema: yes- but resolved Dizzy/lightheaded: no  CHRONIC KIDNEY DISEASE CKD status: controlled Medications renally dose: yes Previous renal evaluation: no Pneumovax:  Up to Date Influenza Vaccine:  Up to Date  MOOD Patient states she feels like the Citalopram  is working for her.  She is sleeping better.  Denies concerns at visit today.    Relevant past medical, surgical, family and social history reviewed and updated as indicated. Interim medical history since our last visit reviewed. Allergies and medications reviewed and updated.  Review of Systems  Eyes:  Negative for visual disturbance.  Respiratory:  Negative for cough, chest tightness and shortness of breath.   Cardiovascular:  Negative for chest pain, palpitations  and leg swelling.  Neurological:  Negative for dizziness and headaches.  Psychiatric/Behavioral:  Negative for dysphoric mood and suicidal ideas. The patient is not nervous/anxious.     Per HPI unless specifically indicated above     Objective:    BP 110/72 (BP Location: Right Arm, Patient Position: Sitting, Cuff Size: Large)   Pulse 87   Temp 98.2 F (36.8 C) (Oral)   Resp 15   Ht 5' 2.99 (1.6 m)   Wt 220 lb (99.8 kg)   LMP  (LMP Unknown)   SpO2 99%   BMI 38.98 kg/m   Wt Readings from Last 3 Encounters:  09/02/23 220 lb (99.8 kg)  05/28/23 221 lb 12.8 oz (100.6 kg)  04/29/23 223 lb 12.8 oz (101.5 kg)    Physical Exam Vitals and nursing note reviewed.  Constitutional:      General: She is not in acute distress.    Appearance: Normal appearance. She is obese. She is not ill-appearing, toxic-appearing or diaphoretic.  HENT:     Head: Normocephalic.     Right Ear: External ear normal.     Left Ear: External ear normal.     Nose: Nose normal.     Mouth/Throat:     Mouth: Mucous membranes are moist.     Pharynx: Oropharynx is clear.   Eyes:     General:        Right eye: No discharge.        Left eye: No discharge.     Extraocular Movements: Extraocular movements  intact.     Conjunctiva/sclera: Conjunctivae normal.     Pupils: Pupils are equal, round, and reactive to light.    Cardiovascular:     Rate and Rhythm: Normal rate and regular rhythm.     Heart sounds: No murmur heard. Pulmonary:     Effort: Pulmonary effort is normal. No respiratory distress.     Breath sounds: Normal breath sounds. No wheezing or rales.   Musculoskeletal:     Cervical back: Normal range of motion and neck supple.   Skin:    General: Skin is warm and dry.     Capillary Refill: Capillary refill takes less than 2 seconds.   Neurological:     General: No focal deficit present.     Mental Status: She is alert and oriented to person, place, and time. Mental status is at baseline.    Psychiatric:        Mood and Affect: Mood normal.        Behavior: Behavior normal.        Thought Content: Thought content normal.        Judgment: Judgment normal.     Results for orders placed or performed in visit on 03/13/23  Comp Met (CMET)   Collection Time: 03/13/23  9:39 AM  Result Value Ref Range   Glucose 88 70 - 99 mg/dL   BUN 13 8 - 27 mg/dL   Creatinine, Ser 1.61 0.57 - 1.00 mg/dL   eGFR 75 >09 UE/AVW/0.98   BUN/Creatinine Ratio 15 12 - 28   Sodium 139 134 - 144 mmol/L   Potassium 3.8 3.5 - 5.2 mmol/L   Chloride 101 96 - 106 mmol/L   CO2 22 20 - 29 mmol/L   Calcium  9.6 8.7 - 10.3 mg/dL   Total Protein 6.9 6.0 - 8.5 g/dL   Albumin 4.2 3.9 - 4.9 g/dL   Globulin, Total 2.7 1.5 - 4.5 g/dL   Bilirubin Total 0.3 0.0 - 1.2 mg/dL   Alkaline Phosphatase 104 44 - 121 IU/L   AST 17 0 - 40 IU/L   ALT 12 0 - 32 IU/L  Lipid Profile   Collection Time: 03/13/23  9:39 AM  Result Value Ref Range   Cholesterol, Total 180 100 - 199 mg/dL   Triglycerides 73 0 - 149 mg/dL   HDL 75 >11 mg/dL   VLDL Cholesterol Cal 14 5 - 40 mg/dL   LDL Chol Calc (NIH) 91 0 - 99 mg/dL   Chol/HDL Ratio 2.4 0.0 - 4.4 ratio  HgB A1c   Collection Time: 03/13/23  9:39 AM  Result Value Ref Range   Hgb A1c MFr Bld 6.1 (H) 4.8 - 5.6 %   Est. average glucose Bld gHb Est-mCnc 128 mg/dL      Assessment & Plan:   Problem List Items Addressed This Visit       Cardiovascular and Mediastinum   Hypertension   Chronic. Well controlled at visit today.  Did have one episode of lower extremity swelling but it resolved.  If it continues, may need to change Amlodipine  or decrease dose.  Continue with Amlodipine  and HCTZ.  Follow up in 6 months.  Call sooner if concerns arise.       Relevant Orders   Comprehensive metabolic panel with GFR     Genitourinary   CKD (chronic kidney disease) stage 3, GFR 30-59 ml/min (HCC) - Primary   Chronic.  Controlled.  Continue with current medication regimen.  Labs  ordered today.  Return to clinic in 6 months for reevaluation.  Call sooner if concerns arise.          Other   Obesity   Recommended eating smaller high protein, low fat meals more frequently and exercising 30 mins a day 5 times a week with a goal of 10-15lb weight loss in the next 3 months.       Hyperlipemia   Chronic, Controlled. Patient is currently taking atorvastatin  10 mg p.o. daily and appears to be tolerating well Recheck cholesterol panel today-results to dictate further management Continue current regimen Follow-up in 6 months for monitoring or sooner if concerns arise      Relevant Orders   Lipid panel   Depression, major, single episode, severe (HCC)   Chronic. Controlled with celexa  10mg .   Follow up in 6 months.  Call sooner if concerns arise.       Prediabetes   Labs ordered at visit today.  Will make recommendations based on lab results.        Relevant Orders   Hemoglobin A1c   Other Visit Diagnoses       Encounter for screening mammogram for malignant neoplasm of breast       Relevant Orders   MM 3D SCREENING MAMMOGRAM BILATERAL BREAST        Follow up plan: Return in about 6 months (around 03/03/2024) for Physical and Fasting labs.

## 2023-09-02 NOTE — Assessment & Plan Note (Signed)
 Chronic. Well controlled at visit today.  Did have one episode of lower extremity swelling but it resolved.  If it continues, may need to change Amlodipine  or decrease dose.  Continue with Amlodipine  and HCTZ.  Follow up in 6 months.  Call sooner if concerns arise.

## 2023-09-02 NOTE — Assessment & Plan Note (Signed)
 Chronic. Controlled with celexa  10mg .   Follow up in 6 months.  Call sooner if concerns arise.

## 2023-09-02 NOTE — Assessment & Plan Note (Signed)
 Chronic.  Controlled.  Continue with current medication regimen.  Labs ordered today.  Return to clinic in 6 months for reevaluation.  Call sooner if concerns arise.  ? ?

## 2023-09-02 NOTE — Patient Instructions (Signed)
 You have an order for:  []   2D Mammogram  [x]   3D Mammogram  []   Bone Density     Please call for appointment:  [facility]  [address] [place] [address] [phone] [facility] [address] [place], [place] [address] [phone] [facility] [address] Geanie Logan [place], [place] [address] [phone]   Make sure to wear two-piece clothing.  No lotions, powders, or deodorants the day of the appointment. Make sure to bring picture ID and insurance card.  Bring list of medications you are currently taking including any supplements.   Schedule your [facility] screening mammogram through MyChart!   Log into your MyChart account.  Go to 'Visit' (or 'Appointments' if on mobile App) --> Schedule an Appointment  Under 'Select a Reason for Visit' choose the Mammogram Screening option.  Complete the pre-visit questions and select the time and place that best fits your schedule.

## 2023-09-02 NOTE — Assessment & Plan Note (Signed)
 Chronic, Controlled. Patient is currently taking atorvastatin 10 mg p.o. daily and appears to be tolerating well Recheck cholesterol panel today-results to dictate further management Continue current regimen Follow-up in 6 months for monitoring or sooner if concerns arise

## 2023-09-02 NOTE — Assessment & Plan Note (Signed)
 Labs ordered at visit today.  Will make recommendations based on lab results.

## 2023-09-03 ENCOUNTER — Ambulatory Visit: Payer: Self-pay | Admitting: Nurse Practitioner

## 2023-09-03 LAB — LIPID PANEL
Chol/HDL Ratio: 2.3 ratio (ref 0.0–4.4)
Cholesterol, Total: 149 mg/dL (ref 100–199)
HDL: 66 mg/dL (ref >39)
LDL Chol Calc (NIH): 52 mg/dL (ref 0–99)
Triglycerides: 196 mg/dL — ABNORMAL HIGH (ref 0–149)
VLDL Cholesterol Cal: 31 mg/dL (ref 5–40)

## 2023-09-03 LAB — COMPREHENSIVE METABOLIC PANEL WITH GFR
ALT: 13 IU/L (ref 0–32)
AST: 15 IU/L (ref 0–40)
Albumin: 4.2 g/dL (ref 3.9–4.9)
Alkaline Phosphatase: 108 IU/L (ref 44–121)
BUN/Creatinine Ratio: 17 (ref 12–28)
BUN: 15 mg/dL (ref 8–27)
Bilirubin Total: <0.2 mg/dL (ref 0.0–1.2)
CO2: 24 mmol/L (ref 20–29)
Calcium: 9.7 mg/dL (ref 8.7–10.3)
Chloride: 103 mmol/L (ref 96–106)
Creatinine, Ser: 0.89 mg/dL (ref 0.57–1.00)
Globulin, Total: 2.8 g/dL (ref 1.5–4.5)
Glucose: 93 mg/dL (ref 70–99)
Potassium: 3.9 mmol/L (ref 3.5–5.2)
Sodium: 141 mmol/L (ref 134–144)
Total Protein: 7 g/dL (ref 6.0–8.5)
eGFR: 70 mL/min/{1.73_m2} (ref >59)

## 2023-09-03 LAB — HEMOGLOBIN A1C
Est. average glucose Bld gHb Est-mCnc: 131 mg/dL
Hgb A1c MFr Bld: 6.2 % — ABNORMAL HIGH (ref 4.8–5.6)

## 2023-09-27 ENCOUNTER — Other Ambulatory Visit: Payer: Self-pay | Admitting: Nurse Practitioner

## 2023-09-30 ENCOUNTER — Other Ambulatory Visit: Payer: Self-pay

## 2023-09-30 NOTE — Telephone Encounter (Signed)
 Too soon for refill, refilled on 07/31/23 for 90 days.  Requested Prescriptions  Pending Prescriptions Disp Refills   citalopram  (CELEXA ) 10 MG tablet [Pharmacy Med Name: CITALOPRAM  HBR 10 MG TABLET] 90 tablet 1    Sig: TAKE 1 TABLET BY MOUTH EVERY DAY     Psychiatry:  Antidepressants - SSRI Passed - 09/30/2023 12:26 PM      Passed - Completed PHQ-2 or PHQ-9 in the last 360 days      Passed - Valid encounter within last 6 months    Recent Outpatient Visits           4 weeks ago Stage 3 chronic kidney disease, unspecified whether stage 3a or 3b CKD (HCC)   [place] [facility] Melvin Pao, NP   4 months ago Primary hypertension   [place] [facility] Melvin Pao, NP   5 months ago Primary hypertension    [facility] Melvin Pao, NP

## 2023-09-30 NOTE — Telephone Encounter (Signed)
 Routing to provider. Both medications are on current med list. Can [facility] to [facility] for the patient?

## 2023-09-30 NOTE — Telephone Encounter (Signed)
 Patient called, left VM to return the call. Will need to verify if she wants this refill of [facility]italopram  sent to [facility] since the last refill was sent to [facility] on 07/31/23 #90/1 refill, which there is a refill available at [facility].

## 2023-09-30 NOTE — Telephone Encounter (Signed)
 Needs clarification that pt wants med to be sent to local pharmacy. Pt has previously wanted mail order.

## 2023-09-30 NOTE — Telephone Encounter (Signed)
 Copied from CRM [id number]. Topic: Clinical - Medication Question >> Sep 30, 2023 11:47 AM Turkey B wrote: Reason for CRM: pt called in wants to know if she is still supposed to be taking the med, atorvastatin  and citalopram  (CELEXA ) 10 MG tablet, if so she wants [facility]

## 2023-10-01 MED ORDER — ATORVASTATIN CALCIUM 10 MG PO TABS: 10.0000 mg | ORAL_TABLET | Freq: Every day | ORAL | 1 refills | Status: AC

## 2023-10-01 MED ORDER — CITALOPRAM HYDROBROMIDE 10 MG PO TABS: 10.0000 mg | ORAL_TABLET | Freq: Every day | ORAL | 1 refills | Status: DC

## 2023-10-17 ENCOUNTER — Other Ambulatory Visit: Payer: Self-pay | Admitting: Nurse Practitioner

## 2023-10-17 NOTE — Telephone Encounter (Signed)
 Requested Prescriptions  Pending Prescriptions Disp Refills   olmesartan  (BENICAR ) 20 MG tablet [Pharmacy Med Name: OLMESARTAN  MEDOXOMIL TABS 20MG ] 90 tablet     Sig: TAKE 1 TABLET DAILY     Cardiovascular:  Angiotensin Receptor Blockers Passed - 10/17/2023  2:00 PM      Passed - Cr in normal range and within 180 days    Creat  Date Value Ref Range Status  09/02/2022 0.94 0.50 - 1.05 mg/dL Final   Creatinine, Ser  Date Value Ref Range Status  09/02/2023 0.89 0.57 - 1.00 mg/dL Final         Passed - K in normal range and within 180 days    Potassium  Date Value Ref Range Status  09/02/2023 3.9 3.5 - 5.2 mmol/L Final  11/15/2013 3.3 (L) 3.5 - 5.1 mmol/L Final         Passed - Patient is not pregnant      Passed - Last BP in normal range    BP Readings from Last 1 Encounters:  09/02/23 110/72         Passed - Valid encounter within last 6 months    Recent Outpatient Visits           1 month ago Stage 3 chronic kidney disease, unspecified whether stage 3a or 3b CKD (HCC)   [place] [facility] Melvin Pao, NP   4 months ago Primary hypertension   [place] [facility] Melvin Pao, NP   5 months ago Primary hypertension   [place] [facility] [place], Pao, NP               amLODipine  (NORVASC ) 10 MG tablet [Pharmacy Med Name: AMLODIPINE  BESYLATE TABS 10MG ] 90 tablet 1    Sig: TAKE 1 TABLET DAILY     Cardiovascular: Calcium  Channel Blockers 2 Passed - 10/17/2023  2:00 PM      Passed - Last BP in normal range    BP Readings from Last 1 Encounters:  09/02/23 110/72         Passed - Last Heart Rate in normal range    Pulse Readings from Last 1 Encounters:  09/02/23 87         Passed - Valid encounter within last 6 months    Recent Outpatient Visits           1 month ago Stage 3 chronic kidney disease, unspecified whether stage 3a or 3b CKD (HCC)   [place] [facility] Melvin Pao, NP   4 months ago Primary hypertension   [place] [facility] Melvin Pao, NP   5 months ago Primary hypertension   [place] [facility] Melvin Pao, NP

## 2024-01-08 ENCOUNTER — Other Ambulatory Visit: Payer: Self-pay | Admitting: Nurse Practitioner

## 2024-01-09 ENCOUNTER — Other Ambulatory Visit: Payer: Self-pay

## 2024-01-09 NOTE — Telephone Encounter (Signed)
 Olmesartan  discontinued on 05/28/23 by provider, will refuse this request.  Requested Prescriptions  Pending Prescriptions Disp Refills   hydrochlorothiazide  (HYDRODIURIL ) 25 MG tablet [Pharmacy Med Name: HYDROCHLOROTHIAZIDE  25 MG TAB] 90 tablet 0    Sig: TAKE 1 TABLET (25 MG TOTAL) BY MOUTH DAILY.     Cardiovascular: Diuretics - Thiazide Passed - 01/09/2024 10:03 PM      Passed - Cr in normal range and within 180 days    Creat  Date Value Ref Range Status  09/02/2022 0.94 0.50 - 1.05 mg/dL Final   Creatinine, Ser  Date Value Ref Range Status  09/02/2023 0.89 0.57 - 1.00 mg/dL Final         Passed - K in normal range and within 180 days    Potassium  Date Value Ref Range Status  09/02/2023 3.9 3.5 - 5.2 mmol/L Final  11/15/2013 3.3 (L) 3.5 - 5.1 mmol/L Final         Passed - Na in normal range and within 180 days    Sodium  Date Value Ref Range Status  09/02/2023 141 134 - 144 mmol/L Final  11/15/2013 140 136 - 145 mmol/L Final         Passed - Last BP in normal range    BP Readings from Last 1 Encounters:  09/02/23 110/72         Passed - Valid encounter within last 6 months    Recent Outpatient Visits           4 months ago Stage 3 chronic kidney disease, unspecified whether stage 3a or 3b CKD (HCC)   [place] [facility] Melvin Pao, NP   7 months ago Primary hypertension   [place] [facility] Melvin Pao, NP   8 months ago Primary hypertension   [place] [facility] Melvin Pao, NP              Refused Prescriptions Disp Refills   olmesartan  (BENICAR ) 20 MG tablet [Pharmacy Med Name: OLMESARTAN  MEDOXOMIL 20 MG TAB] 90 tablet 0    Sig: TAKE 1 TABLET BY MOUTH EVERY DAY     Cardiovascular:  Angiotensin Receptor Blockers Passed - 01/09/2024 10:03 PM      Passed - Cr in normal range and within 180 days    Creat  Date Value Ref Range Status  09/02/2022 0.94 0.50 - 1.05 mg/dL Final    Creatinine, Ser  Date Value Ref Range Status  09/02/2023 0.89 0.57 - 1.00 mg/dL Final         Passed - K in normal range and within 180 days    Potassium  Date Value Ref Range Status  09/02/2023 3.9 3.5 - 5.2 mmol/L Final  11/15/2013 3.3 (L) 3.5 - 5.1 mmol/L Final         Passed - Patient is not pregnant      Passed - Last BP in normal range    BP Readings from Last 1 Encounters:  09/02/23 110/72         Passed - Valid encounter within last 6 months    Recent Outpatient Visits           4 months ago Stage 3 chronic kidney disease, unspecified whether stage 3a or 3b CKD (HCC)   [place] [facility] Melvin Pao, NP   7 months ago Primary hypertension   [place] [facility] Melvin Pao, NP   8 months ago Primary hypertension   [facility] Melvin Pao, NP

## 2024-01-12 ENCOUNTER — Other Ambulatory Visit: Payer: Self-pay

## 2024-03-08 ENCOUNTER — Ambulatory Visit: Payer: Self-pay

## 2024-03-08 ENCOUNTER — Encounter: Payer: Self-pay | Admitting: Nurse Practitioner

## 2024-03-08 NOTE — Telephone Encounter (Signed)
 FYI Only or Action Required?: FYI only for provider: UC advised .  Patient was last seen in primary care on 09/02/2023 by Melvin Pao, NP.  Called Nurse Triage reporting Vomiting.  Symptoms began today.  Interventions attempted: Other: APAP and IBP ,coffee and coca cola.  Symptoms are: unchanged.  Triage Disposition: Go to ED Now (or PCP Triage)  Patient/caregiver understands and will follow disposition?: Yes                   Patient had appointment today and cancelled due to not feeling well. Symptoms started at overnight shift around 1-2 am, has been vomiting hasn't been able to keep water down. Feeling weak. This RN recommended UC now pts , patient agreeable and states granddaughter  will bring her .    1. VOMITING SEVERITY: How many times have you vomited in the past 24 hours?      3 times since 1-2 am , hasn't been able to keep water down states throwing and using bathroom at the same time 2. ONSET: When did the vomiting begin?      1-2 am  , 3 times  3. FLUIDS: What fluids or food have you vomited up today? Have you been able to keep any fluids down?       Drinking hot coffee .  Drank water earlier and vomited not able to keep water down drank  Coca cola earlier this morning  4. ABDOMEN PAIN: Are your having any abdomen pain? If Yes : How bad is it and what does it feel like? (e.g., crampy, dull, intermittent, constant)      Intermittent cramp comes and goes with bowel movements 5. DIARRHEA: Is there any diarrhea? If Yes, ask: How many times today?      1 episode , no blood seen in stools 7. CAUSE: What do you think is causing your vomiting?     Unsure  8. HYDRATION STATUS: Any signs of dehydration? (e.g., dry mouth [not only dry lips], too weak to stand) When did you last urinate? Is peeing , feeling generalized body weakness , states  I feel weak     9. OTHER SYMPTOMS: Do you have any other symptoms? (e.g., fever, headache,  vertigo, vomiting blood or coffee grounds, recent head injury)  Abdominal pain intermittent, weakness, diarrhea  Denies the following  chest pain, difficulty breathing, fever, dizziness , headache               Copied from CRM #[id number]. Topic: Clinical - Red Word Triage >> Mar 08, 2024  8:19 AM Victoria A wrote: [facility] that prompted transfer to Nurse Triage: Patient is having body aches, headaches and chills and vomiting Reason for Disposition  [1] MODERATE to SEVERE vomiting (e.g., 3 or more times/day) AND [2] age > 60 years  Protocols used: Vomiting-A-AH

## 2024-03-08 NOTE — Progress Notes (Deleted)
 "  LMP  (LMP Unknown)    Subjective:    Patient ID: Ashley Mcknight, female    DOB: [birth year], 73 y.o.   MRN: [id number]  HPI: Ashley Mcknight is a 73 y.o. female  Mcknight chief complaint on file.  HYPERTENSION / HYPERLIPIDEMIA Satisfied with current treatment? yes Duration of hypertension: years BP monitoring frequency: not checking BP range:  BP medication side effects: Mcknight Past BP meds: Amlodipine  and HCTZ Duration of hyperlipidemia: years Cholesterol medication side effects: Mcknight Cholesterol supplements: none Past cholesterol medications: atorvastain (lipitor) Medication compliance: excellent compliance Aspirin: Mcknight Recent stressors: Mcknight Recurrent headaches: Mcknight Visual changes: Mcknight Palpitations: Mcknight Dyspnea: Mcknight Chest pain: Mcknight Lower extremity edema: yes- but resolved Dizzy/lightheaded: Mcknight  CHRONIC KIDNEY DISEASE CKD status: controlled Medications renally dose: yes Previous renal evaluation: Mcknight Pneumovax:  Up to Date Influenza Vaccine:  Up to Date  MOOD Patient states she feels like the Citalopram  is working for her.  She is sleeping better.  Denies concerns at visit today.    Relevant past medical, surgical, family and social history reviewed and updated as indicated. Interim medical history since our last visit reviewed. Allergies and medications reviewed and updated.  Review of Systems  Eyes:  Negative for visual disturbance.  Respiratory:  Negative for cough, chest tightness and shortness of breath.   Cardiovascular:  Negative for chest pain, palpitations and leg swelling.  Neurological:  Negative for dizziness and headaches.  Psychiatric/Behavioral:  Negative for dysphoric mood and suicidal ideas. The patient is not nervous/anxious.     Per HPI unless specifically indicated above     Objective:    LMP  (LMP Unknown)   Wt Readings from Last 3 Encounters:  09/02/23 220 lb (99.8 kg)  05/28/23 221 lb 12.8 oz (100.6 kg)  04/29/23 223 lb 12.8 oz (101.5 kg)     Physical Exam Vitals and nursing note reviewed.  Constitutional:      General: She is not in acute distress.    Appearance: Normal appearance. She is obese. She is not ill-appearing, toxic-appearing or diaphoretic.  HENT:     Head: Normocephalic.     Right Ear: External ear normal.     Left Ear: External ear normal.     Nose: Nose normal.     Mouth/Throat:     Mouth: Mucous membranes are moist.     Pharynx: Oropharynx is clear.  Eyes:     General:        Right eye: Mcknight discharge.        Left eye: Mcknight discharge.     Extraocular Movements: Extraocular movements intact.     Conjunctiva/sclera: Conjunctivae normal.     Pupils: Pupils are equal, round, and reactive to light.  Cardiovascular:     Rate and Rhythm: Normal rate and regular rhythm.     Heart sounds: Mcknight murmur heard. Pulmonary:     Effort: Pulmonary effort is normal. Mcknight respiratory distress.     Breath sounds: Normal breath sounds. Mcknight wheezing or rales.  Musculoskeletal:     Cervical back: Normal range of motion and neck supple.  Skin:    General: Skin is warm and dry.     Capillary Refill: Capillary refill takes less than 2 seconds.  Neurological:     General: Mcknight focal deficit present.     Mental Status: She is alert and oriented to person, place, and time. Mental status is at baseline.  Psychiatric:        Mood and  Affect: Mood normal.        Behavior: Behavior normal.        Thought Content: Thought content normal.        Judgment: Judgment normal.     Results for orders placed or performed in visit on 09/02/23  Comprehensive metabolic panel with GFR   Collection Time: 09/02/23  9:40 AM  Result Value Ref Range   Glucose 93 70 - 99 mg/dL   BUN 15 8 - 27 mg/dL   Creatinine, Ser 9.10 0.57 - 1.00 mg/dL   eGFR 70 >40 fO/fpw/8.26   BUN/Creatinine Ratio 17 12 - 28   Sodium 141 134 - 144 mmol/L   Potassium 3.9 3.5 - 5.2 mmol/L   Chloride 103 96 - 106 mmol/L   CO2 24 20 - 29 mmol/L   Calcium  9.7 8.7 - 10.3 mg/dL    Total Protein 7.0 6.0 - 8.5 g/dL   Albumin 4.2 3.9 - 4.9 g/dL   Globulin, Total 2.8 1.5 - 4.5 g/dL   Bilirubin Total <9.7 0.0 - 1.2 mg/dL   Alkaline Phosphatase 108 44 - 121 IU/L   AST 15 0 - 40 IU/L   ALT 13 0 - 32 IU/L  Hemoglobin A1c   Collection Time: 09/02/23  9:40 AM  Result Value Ref Range   Hgb A1c MFr Bld 6.2 (H) 4.8 - 5.6 %   Est. average glucose Bld gHb Est-mCnc 131 mg/dL  Lipid panel   Collection Time: 09/02/23  9:40 AM  Result Value Ref Range   Cholesterol, Total 149 100 - 199 mg/dL   Triglycerides 803 (H) 0 - 149 mg/dL   HDL 66 >60 mg/dL   VLDL Cholesterol Cal 31 5 - 40 mg/dL   LDL Chol Calc (NIH) 52 0 - 99 mg/dL   Chol/HDL Ratio 2.3 0.0 - 4.4 ratio      Assessment & Plan:   Problem List Items Addressed This Visit   None     Follow up plan: Mcknight follow-ups on file.      "

## 2024-03-15 ENCOUNTER — Ambulatory Visit: Payer: Self-pay | Admitting: *Deleted

## 2024-03-15 ENCOUNTER — Ambulatory Visit: Payer: Self-pay

## 2024-03-15 NOTE — Telephone Encounter (Signed)
 FYI Only or Action Required?: Action required by provider: update on patient condition. ED advised. Pt refused.   Patient was last seen in primary care on 09/02/2023 by Ashley Pao, NP.  Called Nurse Triage reporting Abdominal Pain and Nausea.  Symptoms began several weeks ago.  Interventions attempted: OTC medications: tylenol .  Symptoms are: gradually worsening.  Triage Disposition: Go to ED Now (Notify PCP)  Patient/caregiver understands and will follow disposition?: No, refuses disposition   Copied from CRM #[id number]. Topic: Clinical - [name] Triage >> Mar 15, 2024 12:27 PM Ashley Mcknight wrote: Ashley Mcknight triggered transfer to Nurse Triage. See Triage Message for details. >> Mar 15, 2024 12:28 PM Ashley Mcknight wrote: Reason for Triage: abdominal pain. - would like to schedule a physical appt as well. Reason for Disposition  [1] SEVERE pain AND [2] age > 60 years  Answer Assessment - Initial Assessment Questions 1. LOCATION: Where does it hurt?      Right in the middle, around my navel.   2. RADIATION: Does the pain shoot anywhere else? (e.g., chest, back)     Denies  3. ONSET: When did the pain begin? (e.g., minutes, hours or days ago)      Several weeks now  4. SUDDEN: Gradual or sudden onset?     Gradual  5. PATTERN Does the pain come and go, or is it constant?     Comes and goes  6. SEVERITY: How bad is the pain?  (e.g., Scale 1-10; mild, moderate, or severe)     Cramping pain like I have to use the bathroom, 6/10       8. CAUSE: What do you think is causing the stomach pain? (e.g., gallstones, recent abdominal surgery)     Pt unsure, was supposed to go to [facility] 12/22 but decided not to go  9. RELIEVING/AGGRAVATING FACTORS: What makes it better or worse? (e.g., antacids, bending or twisting motion, bowel movement)     Eating makes it worse, she feels a cramping pain and then vomits  10. OTHER SYMPTOMS: Do you have any other symptoms? (e.g.,  back pain, diarrhea, fever, urination pain, vomiting)       Denies fever, reports vomiting  Pt reports she has no loved ones available to take her to the ED. This RN offered to call 911, pt refused. Pt states she will go to the ED tomorrow. Pt educated on calling back if symptoms change or worsen.  Protocols used: Abdominal Pain - Female-A-AH

## 2024-03-15 NOTE — Telephone Encounter (Signed)
 Reason for Triage: abdominal pain. - would like to schedule a physical appt as well.   Attempted to contact patient- no answer- left message to call office to discuss symptoms

## 2024-04-03 ENCOUNTER — Other Ambulatory Visit: Payer: Self-pay | Admitting: Nurse Practitioner

## 2024-04-05 NOTE — Telephone Encounter (Signed)
 Courtesy refill given, appointment needed.   Requested Prescriptions  Pending Prescriptions Disp Refills   citalopram  (CELEXA ) 10 MG tablet [Pharmacy Med Name: CITALOPRAM  HBR 10 MG TABLET] 30 tablet 0    Sig: TAKE 1 TABLET BY MOUTH EVERY DAY     Psychiatry:  Antidepressants - SSRI Failed - 04/05/2024  8:54 AM      Failed - Valid encounter within last 6 months    Recent Outpatient Visits           7 months ago Stage 3 chronic kidney disease, unspecified whether stage 3a or 3b CKD (HCC)   [place] [facility] Melvin Pao, NP   10 months ago Primary hypertension   [place] [facility] Melvin Pao, NP   11 months ago Primary hypertension   [place] [facility] Melvin Pao, NP              Passed - Completed PHQ-2 or PHQ-9 in the last 360 days

## 2024-04-14 ENCOUNTER — Other Ambulatory Visit: Payer: Self-pay | Admitting: Nurse Practitioner

## 2024-04-15 NOTE — Telephone Encounter (Signed)
 OFFICE VISIT NEEDED FOR ADDITIONAL REFILLS.  Requested Prescriptions  Pending Prescriptions Disp Refills   hydrochlorothiazide  (HYDRODIURIL ) 25 MG tablet [Pharmacy Med Name: HYDROCHLOROTHIAZIDE  25 MG TAB] 30 tablet 0    Sig: TAKE 1 TABLET (25 MG TOTAL) BY MOUTH DAILY.     Cardiovascular: Diuretics - Thiazide Failed - 04/15/2024  8:35 AM      Failed - Cr in normal range and within 180 days    Creat  Date Value Ref Range Status  09/02/2022 0.94 0.50 - 1.05 mg/dL Final   Creatinine, Ser  Date Value Ref Range Status  09/02/2023 0.89 0.57 - 1.00 mg/dL Final         Failed - K in normal range and within 180 days    Potassium  Date Value Ref Range Status  09/02/2023 3.9 3.5 - 5.2 mmol/L Final  11/15/2013 3.3 (L) 3.5 - 5.1 mmol/L Final         Failed - Na in normal range and within 180 days    Sodium  Date Value Ref Range Status  09/02/2023 141 134 - 144 mmol/L Final  11/15/2013 140 136 - 145 mmol/L Final         Failed - Valid encounter within last 6 months    Recent Outpatient Visits           7 months ago Stage 3 chronic kidney disease, unspecified whether stage 3a or 3b CKD (HCC)   [place] [facility] Melvin Pao, NP   10 months ago Primary hypertension   [place] [facility] Melvin Pao, NP   11 months ago Primary hypertension   [place] [facility] Melvin Pao, NP              Passed - Last BP in normal range    BP Readings from Last 1 Encounters:  09/02/23 110/72
